# Patient Record
Sex: Female | Born: 1960 | Race: White | Hispanic: No | Marital: Single | Smoking: Former smoker
Health system: Southern US, Community
[De-identification: ages and names within clinical notes are randomized; demographics above are authoritative.]

## PROBLEM LIST (undated history)

## (undated) DIAGNOSIS — E039 Hypothyroidism, unspecified: Secondary | ICD-10-CM

## (undated) DIAGNOSIS — I5032 Chronic diastolic (congestive) heart failure: Secondary | ICD-10-CM

## (undated) DIAGNOSIS — K219 Gastro-esophageal reflux disease without esophagitis: Secondary | ICD-10-CM

## (undated) DIAGNOSIS — F419 Anxiety disorder, unspecified: Secondary | ICD-10-CM

## (undated) DIAGNOSIS — F32A Depression, unspecified: Secondary | ICD-10-CM

## (undated) DIAGNOSIS — M1990 Unspecified osteoarthritis, unspecified site: Secondary | ICD-10-CM

## (undated) DIAGNOSIS — I2510 Atherosclerotic heart disease of native coronary artery without angina pectoris: Secondary | ICD-10-CM

## (undated) DIAGNOSIS — F329 Major depressive disorder, single episode, unspecified: Secondary | ICD-10-CM

## (undated) DIAGNOSIS — IMO0002 Reserved for concepts with insufficient information to code with codable children: Secondary | ICD-10-CM

## (undated) DIAGNOSIS — E669 Obesity, unspecified: Secondary | ICD-10-CM

## (undated) DIAGNOSIS — E785 Hyperlipidemia, unspecified: Secondary | ICD-10-CM

## (undated) DIAGNOSIS — I10 Essential (primary) hypertension: Secondary | ICD-10-CM

## (undated) DIAGNOSIS — M797 Fibromyalgia: Secondary | ICD-10-CM

## (undated) HISTORY — PX: CORONARY STENT PLACEMENT: SHX1402

---

## 2011-12-31 ENCOUNTER — Emergency Department (HOSPITAL_COMMUNITY): Payer: Medicaid Other

## 2011-12-31 ENCOUNTER — Inpatient Hospital Stay (HOSPITAL_COMMUNITY)
Admission: EM | Admit: 2011-12-31 | Discharge: 2012-01-02 | DRG: 247 | Disposition: A | Discharge status: Home / Self Care | Payer: Medicaid Other | Source: Ambulatory Visit | Attending: Cardiology | Admitting: Cardiology

## 2011-12-31 ENCOUNTER — Encounter (HOSPITAL_COMMUNITY): Payer: Self-pay | Admitting: Neurology

## 2011-12-31 DIAGNOSIS — IMO0001 Reserved for inherently not codable concepts without codable children: Secondary | ICD-10-CM | POA: Diagnosis present

## 2011-12-31 DIAGNOSIS — Z6834 Body mass index (BMI) 34.0-34.9, adult: Secondary | ICD-10-CM

## 2011-12-31 DIAGNOSIS — Z955 Presence of coronary angioplasty implant and graft: Secondary | ICD-10-CM

## 2011-12-31 DIAGNOSIS — E669 Obesity, unspecified: Secondary | ICD-10-CM | POA: Diagnosis present

## 2011-12-31 DIAGNOSIS — F17200 Nicotine dependence, unspecified, uncomplicated: Secondary | ICD-10-CM | POA: Diagnosis present

## 2011-12-31 DIAGNOSIS — E785 Hyperlipidemia, unspecified: Secondary | ICD-10-CM

## 2011-12-31 DIAGNOSIS — R079 Chest pain, unspecified: Secondary | ICD-10-CM

## 2011-12-31 DIAGNOSIS — E039 Hypothyroidism, unspecified: Secondary | ICD-10-CM | POA: Diagnosis present

## 2011-12-31 DIAGNOSIS — K219 Gastro-esophageal reflux disease without esophagitis: Secondary | ICD-10-CM | POA: Diagnosis present

## 2011-12-31 DIAGNOSIS — I2510 Atherosclerotic heart disease of native coronary artery without angina pectoris: Secondary | ICD-10-CM | POA: Diagnosis present

## 2011-12-31 DIAGNOSIS — I214 Non-ST elevation (NSTEMI) myocardial infarction: Secondary | ICD-10-CM

## 2011-12-31 DIAGNOSIS — M129 Arthropathy, unspecified: Secondary | ICD-10-CM | POA: Diagnosis present

## 2011-12-31 HISTORY — DX: Hyperlipidemia, unspecified: E78.5

## 2011-12-31 HISTORY — DX: Gastro-esophageal reflux disease without esophagitis: K21.9

## 2011-12-31 HISTORY — DX: Fibromyalgia: M79.7

## 2011-12-31 HISTORY — DX: Reserved for concepts with insufficient information to code with codable children: IMO0002

## 2011-12-31 HISTORY — DX: Hypothyroidism, unspecified: E03.9

## 2011-12-31 HISTORY — DX: Unspecified osteoarthritis, unspecified site: M19.90

## 2011-12-31 HISTORY — DX: Obesity, unspecified: E66.9

## 2011-12-31 LAB — PROTIME-INR: Prothrombin Time: 12.7 seconds (ref 11.6–15.2)

## 2011-12-31 LAB — DIFFERENTIAL
Basophils Absolute: 0 10*3/uL (ref 0.0–0.1)
Lymphocytes Relative: 33 % (ref 12–46)
Lymphs Abs: 3.3 10*3/uL (ref 0.7–4.0)
Neutro Abs: 5.8 10*3/uL (ref 1.7–7.7)
Neutrophils Relative %: 59 % (ref 43–77)

## 2011-12-31 LAB — CBC
MCV: 96.7 fL (ref 78.0–100.0)
Platelets: 305 10*3/uL (ref 150–400)
RBC: 3.68 MIL/uL — ABNORMAL LOW (ref 3.87–5.11)
WBC: 9.9 10*3/uL (ref 4.0–10.5)

## 2011-12-31 LAB — BASIC METABOLIC PANEL
CO2: 27 mEq/L (ref 19–32)
Chloride: 102 mEq/L (ref 96–112)
Glucose, Bld: 97 mg/dL (ref 70–99)
Potassium: 3.8 mEq/L (ref 3.5–5.1)
Sodium: 140 mEq/L (ref 135–145)

## 2011-12-31 LAB — CARDIAC PANEL(CRET KIN+CKTOT+MB+TROPI)
CK, MB: 20 ng/mL (ref 0.3–4.0)
Troponin I: 2.91 ng/mL (ref <0.30)

## 2011-12-31 LAB — APTT: aPTT: 25 seconds (ref 24–37)

## 2011-12-31 LAB — CK TOTAL AND CKMB (NOT AT ARMC)
CK, MB: 4.7 ng/mL — ABNORMAL HIGH (ref 0.3–4.0)
Total CK: 104 U/L (ref 7–177)

## 2011-12-31 LAB — TROPONIN I: Troponin I: 0.37 ng/mL (ref <0.30)

## 2011-12-31 MED ORDER — ONDANSETRON HCL 4 MG/2ML IJ SOLN: 4.0000 mg | Freq: Four times a day (QID) | INTRAMUSCULAR | Status: DC | PRN

## 2011-12-31 MED ORDER — FLUOXETINE HCL 20 MG PO CAPS
40.0000 mg | ORAL_CAPSULE | Freq: Every day | ORAL | Status: DC
  Administered 2012-01-01 – 2012-01-02 (×2): 40 mg via ORAL
  Filled 2011-12-31 (×2): qty 2

## 2011-12-31 MED ORDER — NITROGLYCERIN 0.4 MG SL SUBL: 0.4000 mg | SUBLINGUAL_TABLET | SUBLINGUAL | Status: DC | PRN

## 2011-12-31 MED ORDER — LEVOTHYROXINE SODIUM 112 MCG PO TABS
112.0000 ug | ORAL_TABLET | Freq: Every day | ORAL | Status: DC
  Administered 2012-01-01 – 2012-01-02 (×2): 112 ug via ORAL
  Filled 2011-12-31 (×3): qty 1

## 2011-12-31 MED ORDER — PNEUMOCOCCAL VAC POLYVALENT 25 MCG/0.5ML IJ INJ
0.5000 mL | INJECTION | INTRAMUSCULAR | Status: DC
  Filled 2011-12-31: qty 0.5

## 2011-12-31 MED ORDER — HEPARIN (PORCINE) IN NACL 100-0.45 UNIT/ML-% IJ SOLN
1000.0000 [IU]/h | INTRAMUSCULAR | Status: DC
  Administered 2011-12-31: 900 [IU]/h via INTRAVENOUS
  Administered 2012-01-01: 1000 [IU]/h via INTRAVENOUS
  Filled 2011-12-31 (×2): qty 250

## 2011-12-31 MED ORDER — ASPIRIN EC 81 MG PO TBEC
81.0000 mg | DELAYED_RELEASE_TABLET | Freq: Every day | ORAL | Status: DC
  Administered 2012-01-01 – 2012-01-02 (×2): 81 mg via ORAL
  Filled 2011-12-31 (×2): qty 1

## 2011-12-31 MED ORDER — TICAGRELOR 90 MG PO TABS
180.0000 mg | ORAL_TABLET | Freq: Once | ORAL | Status: AC
  Administered 2011-12-31: 180 mg via ORAL
  Filled 2011-12-31: qty 2

## 2011-12-31 MED ORDER — ACETAMINOPHEN 325 MG PO TABS: 650.0000 mg | ORAL_TABLET | ORAL | Status: DC | PRN

## 2011-12-31 MED ORDER — HYDROCODONE-ACETAMINOPHEN 10-325 MG PO TABS
1.0000 | ORAL_TABLET | ORAL | Status: DC | PRN
  Administered 2011-12-31 – 2012-01-02 (×5): 1 via ORAL
  Filled 2011-12-31 (×5): qty 1

## 2011-12-31 MED ORDER — SODIUM CHLORIDE 0.9 % IV SOLN
INTRAVENOUS | Status: DC
  Administered 2012-01-01: 05:00:00 via INTRAVENOUS

## 2011-12-31 MED ORDER — DIAZEPAM 5 MG PO TABS
5.0000 mg | ORAL_TABLET | ORAL | Status: AC
  Administered 2012-01-01: 5 mg via ORAL
  Filled 2011-12-31: qty 1

## 2011-12-31 MED ORDER — HEPARIN BOLUS VIA INFUSION
4000.0000 [IU] | Freq: Once | INTRAVENOUS | Status: AC
  Administered 2011-12-31: 4000 [IU] via INTRAVENOUS

## 2011-12-31 MED ORDER — SODIUM CHLORIDE 0.9 % IJ SOLN
3.0000 mL | Freq: Two times a day (BID) | INTRAMUSCULAR | Status: DC
  Administered 2012-01-01 – 2012-01-02 (×4): 3 mL via INTRAVENOUS

## 2011-12-31 MED ORDER — SODIUM CHLORIDE 0.9 % IJ SOLN: 3.0000 mL | INTRAMUSCULAR | Status: DC | PRN

## 2011-12-31 MED ORDER — ALPRAZOLAM 0.25 MG PO TABS
0.2500 mg | ORAL_TABLET | Freq: Two times a day (BID) | ORAL | Status: DC | PRN
  Administered 2012-01-01: 0.25 mg via ORAL
  Filled 2011-12-31: qty 1

## 2011-12-31 MED ORDER — SODIUM CHLORIDE 0.9 % IV SOLN: 250.0000 mL | INTRAVENOUS | Status: DC | PRN

## 2011-12-31 MED ORDER — HEPARIN (PORCINE) IN NACL 100-0.45 UNIT/ML-% IJ SOLN: 12.0000 [IU]/kg/h | Freq: Once | INTRAMUSCULAR | Status: DC

## 2011-12-31 MED ORDER — PANTOPRAZOLE SODIUM 40 MG PO TBEC
80.0000 mg | DELAYED_RELEASE_TABLET | Freq: Every day | ORAL | Status: DC
  Administered 2012-01-01 – 2012-01-02 (×2): 80 mg via ORAL
  Filled 2011-12-31: qty 2
  Filled 2011-12-31: qty 1

## 2011-12-31 MED ORDER — ZOLPIDEM TARTRATE 5 MG PO TABS
5.0000 mg | ORAL_TABLET | Freq: Every evening | ORAL | Status: DC | PRN
  Administered 2012-01-01 (×2): 5 mg via ORAL
  Filled 2011-12-31 (×2): qty 1

## 2011-12-31 MED ORDER — BUPROPION HCL ER (XL) 150 MG PO TB24
150.0000 mg | ORAL_TABLET | Freq: Every day | ORAL | Status: DC
  Administered 2012-01-01 – 2012-01-02 (×2): 150 mg via ORAL
  Filled 2011-12-31 (×2): qty 1

## 2011-12-31 MED ORDER — NITROGLYCERIN 2 % TD OINT
1.0000 [in_us] | TOPICAL_OINTMENT | Freq: Four times a day (QID) | TRANSDERMAL | Status: DC
  Administered 2011-12-31: 1 [in_us] via TOPICAL
  Filled 2011-12-31: qty 1
  Filled 2011-12-31: qty 30

## 2011-12-31 MED ORDER — ASPIRIN 81 MG PO CHEW
324.0000 mg | CHEWABLE_TABLET | ORAL | Status: AC
  Administered 2012-01-01: 324 mg via ORAL
  Filled 2011-12-31: qty 4

## 2011-12-31 MED ORDER — ATORVASTATIN CALCIUM 80 MG PO TABS
80.0000 mg | ORAL_TABLET | Freq: Every day | ORAL | Status: DC
  Administered 2011-12-31 – 2012-01-01 (×2): 80 mg via ORAL
  Filled 2011-12-31 (×3): qty 1

## 2011-12-31 MED ORDER — SODIUM CHLORIDE 0.9 % IJ SOLN: 3.0000 mL | Freq: Two times a day (BID) | INTRAMUSCULAR | Status: DC

## 2011-12-31 MED ORDER — TICAGRELOR 90 MG PO TABS
90.0000 mg | ORAL_TABLET | Freq: Two times a day (BID) | ORAL | Status: DC
  Administered 2012-01-01 – 2012-01-02 (×3): 90 mg via ORAL
  Filled 2011-12-31 (×4): qty 1

## 2011-12-31 MED ORDER — METOPROLOL TARTRATE 12.5 MG HALF TABLET
12.5000 mg | ORAL_TABLET | Freq: Four times a day (QID) | ORAL | Status: DC
  Administered 2012-01-01 – 2012-01-02 (×6): 12.5 mg via ORAL
  Filled 2011-12-31 (×10): qty 1

## 2011-12-31 NOTE — ED Notes (Signed)
Per ems- Pt picked up from PCP. C/o CP since 1100 today, went to office around 1300. Given 3 nitro. GI cocktail, 60 toradol, 325 aspirin. Pain was relieved some. CP central radiation to left arm, also tongue feeling numb. At this time CP 3/10. EKG SR. 124/49 BP, HR 80, 99% 2 L. A x 4. Skin warm and dry.

## 2011-12-31 NOTE — H&P (Signed)
Physician History and Physical    Mahnoor Mathisen MRN: [id number] DOB/AGE: Dec 22, 1960 51 y.o. Admit date: 12/31/2011  Primary Care Physician: Dr. Zola Button in [place] Primary Cardiologist: New  HPI:  51 yo with history of hyperlipidemia and smoking presents with NSTEMI.  Patient has had trouble with GERD recently => chest burning and acid taste in mouth.  However, last week she had a different episode.  She had chest pressure at rest radiating down the left arm and to the jaw.  This lasted 15 minutes.  Today, she lay down after breakfast around 10:30.  She developed severe chest pressure (8/10) again radiating to the left arm and the jaw.  This time, it persisted.  At 1 pm, she asked a family member to take her to her PCP.  Her PCP gave her 3 NTG and toradol with minimal relief of pain.  EMS was called and she came to the ER.  By the time she reached the ER, pain was down to 2/10 where it is currently.  She feels much better.  No significant ECG changes.  CKMB and troponin elevated.  She is still smoking about 2-3 cigarettes/day.   Review of systems complete and found to be negative unless listed above   PMH: 1. Low back pain 2. Hypothyroidism 3. Impaired fasting glucose 4. Smoking 5. Hyperlipidemia 6. Fibromyalgia 7. Stress echo in 2007 was negative per patient's report.  8. GERD  FH: No premature CAD  History   Social History  . Marital Status: Single    Spouse Name: N/A    Number of Children: N/A  . Years of Education: N/A   Occupational History  . Not on file.   Social History Main Topics  . Smoking status: Current Some Day Smoker  . Smokeless tobacco: Not on file  . Alcohol Use: No  . Drug Use: No  . Sexually Active:    Other Topics Concern  . Not on file   Social History Narrative  . Lives in [place], has 2 children, retired from [employer] work.      Physical Exam: Blood pressure 120/64, pulse 81, temperature 98.1 F (36.7 C), temperature source Oral, resp. rate 16,  height 5\' 4"  (1.626 m), weight 86.183 kg (190 lb), SpO2 100.00%.  General: NAD Neck: JVP 8 cm, no thyromegaly or thyroid nodule.  Lungs: Clear to auscultation bilaterally with normal respiratory effort. CV: Nondisplaced PMI.  Heart regular S1/S2, no S3/S4, no murmur.  No peripheral edema.  No carotid bruit.  Normal pedal pulses.  Abdomen: Soft, nontender, no hepatosplenomegaly, no distention.  Skin: Intact without lesions or rashes.  Neurologic: Alert and oriented x 3.  Psych: Normal affect. Extremities: No clubbing or cyanosis.  HEENT: Normal.   Labs:   Lab Results  Component Value Date   WBC 9.9 12/31/2011   HGB 11.5* 12/31/2011   HCT 35.6* 12/31/2011   MCV 96.7 12/31/2011   PLT 305 12/31/2011    Lab 12/31/11 1555  NA 140  K 3.8  CL 102  CO2 27  BUN 12  CREATININE 0.78  CALCIUM 8.9  PROT --  BILITOT --  ALKPHOS --  ALT --  AST --  GLUCOSE 97   Lab Results  Component Value Date   CKTOTAL 104 12/31/2011   CKMB 4.7* 12/31/2011   TROPONINI 0.37* 12/31/2011    Radiology: CXR with no active disease. EKG: NSR, normal  ASSESSMENT AND PLAN:  51 yo with history of smoking and hyperlipidemia presented with chest pain  and evidence for NSTEMI by enzymes.  1. NSTEMI: Elevated CKMB and troponin.  CP down to 2/10 on NTG paste, feeling much better.  - Needs LHC, plan for the am unless she becomes unstable.  - Heparin gtt, ASA, atorvastatin 80 - As she will not be cathed until the am and still has mild chest pressure, will start ticagrelor.  - Metoprolol 12.5 mg q6 hrs.  - Echo.  2. Smoking: Needs to quit smoking.    Signed: Marca Ancona 12/31/2011, 5:31 PM Co-Sign MD

## 2011-12-31 NOTE — ED Notes (Signed)
Pt up to bathroom without any problems 

## 2011-12-31 NOTE — ED Notes (Signed)
Dinner tray brought to pt will take up stairs.

## 2011-12-31 NOTE — ED Notes (Signed)
Critical Tropin called from lab Dr. Ethelda Chick made aware.

## 2011-12-31 NOTE — ED Notes (Signed)
Pt denies any chest pain at this time.  Skin warm and dry color appropriate.  Husband at bedside.

## 2011-12-31 NOTE — ED Provider Notes (Addendum)
Planes of anterior chest pain pressure-like felt like indigestion onset approximately 11 AM today radiates to left arm and both jaws onset at rest not made better or worse by anything no associated shortness of breath nausea or sweatiness patient seen in clinic prior to coming here treated with sublingual nitroglycerin GI cocktail aspirin and portal with relief of discomfort is mild at present. Cardiac risk factors diabetes hypercholesterolemia smoker family history On exam no distress lungs clear auscultation heart regular rate and rhythm no murmurs abdomen obese nontender extremities without edema skin warm dry Medical decision making story suspicious for angina given risk factors. Positive troponin is consistent with N STEMI At 5:40 PM patient asymptomatic pain-free  CRITICAL CARE Performed by: Doug Sou   Total critical care time: 30 minute  Critical care time was exclusive of separately billable procedures and treating other patients.  Critical care was necessary to treat or prevent imminent or life-threatening deterioration.  Critical care was time spent personally by me on the following activities: development of treatment plan with patient and/or surrogate as well as nursing, discussions with consultants, evaluation of patient's response to treatment, examination of patient, obtaining history from patient or surrogate, ordering and performing treatments and interventions, ordering and review of laboratory studies, ordering and review of radiographic studies, pulse oximetry and re-evaluation of patient's condition. Doug Sou, MD 12/31/11 1604  Doug Sou, MD 12/31/11 1746

## 2011-12-31 NOTE — ED Notes (Signed)
Pt asking for ice MD st's pt can have a few ice chips.  When taking pt ice pt was taking a pill out of a pill bottle in her pocketbook. I ask what she was taking and she st's a nerve pill.  Informed MD of this and he said he does not want her to take it at this time.  I informed pt of same pt returned pill to her pocketbook.  I put her pocketbook on the chair pt requested that it be on the bed with her so her family would not seal her pills.

## 2011-12-31 NOTE — ED Provider Notes (Signed)
History     CSN: [id number]  Arrival date & time 12/31/11  1505   First MD Initiated Contact with Patient 12/31/11 1523      Chief Complaint  Patient presents with  . Chest Pain    (Consider location/radiation/quality/duration/timing/severity/associated sxs/prior treatment) Patient is a 51 y.o. female presenting with chest pain. The history is provided by the patient.  Chest Pain The chest pain began 3 - 5 hours ago (at 11am). Chest pain occurs constantly. The chest pain is resolved (after SL NTG x 3). The pain is associated with stress. The severity of the pain is mild. The quality of the pain is described as dull and heavy. The pain does not radiate. Chest pain is worsened by stress. Pertinent negatives for primary symptoms include no fever, no shortness of breath, no cough, no wheezing, no palpitations, no abdominal pain and no vomiting. She tried nitroglycerin for the symptoms. Risk factors include post-menopausal and sedentary lifestyle.  Pertinent negatives for past medical history include no CAD, no MI and no PE.  Her family medical history is significant for CAD in family.  Procedure history is positive for stress echo (6 yrs ago and negative per pt's report).     Past Medical History  Diagnosis Date  . Fibromyalgia   . Bulging disc   . Arthritis     History reviewed. No pertinent past surgical history.  No family history on file.  History  Substance Use Topics  . Smoking status: Current Some Day Smoker  . Smokeless tobacco: Not on file  . Alcohol Use: No    OB History    Grav Para Term Preterm Abortions TAB SAB Ect Mult Living                  Review of Systems  Constitutional: Negative for fever, chills, activity change and appetite change.  HENT: Negative for neck pain and neck stiffness.   Respiratory: Positive for chest tightness. Negative for cough, shortness of breath and wheezing.   Cardiovascular: Positive for chest pain. Negative for palpitations.   Gastrointestinal: Negative for vomiting, abdominal pain, diarrhea and constipation.  Genitourinary: Negative for dysuria and difficulty urinating.  Skin: Negative for rash and wound.  Neurological: Negative for syncope and light-headedness.  Psychiatric/Behavioral: Negative for confusion and agitation.  All other systems reviewed and are negative.    Allergies  Review of patient's allergies indicates not on file.  Home Medications  No current outpatient prescriptions on file.  BP 120/64  Pulse 81  Temp 98.1 F (36.7 C) (Oral)  Resp 16  SpO2 100%  Physical Exam  Nursing note and vitals reviewed. Constitutional: She appears well-developed and well-nourished.  HENT:  Head: Normocephalic and atraumatic.  Right Ear: External ear normal.  Left Ear: External ear normal.  Nose: Nose normal.  Mouth/Throat: Oropharynx is clear and moist. No oropharyngeal exudate.  Eyes: Conjunctivae are normal. Pupils are equal, round, and reactive to light.  Neck: Normal range of motion. Neck supple.  Cardiovascular: Normal rate, regular rhythm, normal heart sounds and intact distal pulses.  Exam reveals no gallop and no friction rub.   No murmur heard. Pulmonary/Chest: Effort normal and breath sounds normal. No respiratory distress. She has no wheezes. She has no rales. She exhibits no tenderness.  Abdominal: Soft. Bowel sounds are normal. She exhibits no distension and no mass. There is no tenderness. There is no rebound and no guarding.  Musculoskeletal: Normal range of motion. She exhibits no edema.  Neurological: She  is alert.  Skin: Skin is warm and dry.  Psychiatric: She has a normal mood and affect. Her behavior is normal. Judgment and thought content normal.    ED Course  Procedures (including critical care time)  Labs Reviewed - No data to display Dg Chest Portable 1 View  12/31/2011  *RADIOLOGY REPORT*  Clinical Data: Chest pain.  Smoker.  History of fibromyalgia.  PORTABLE CHEST -  1 VIEW 12/31/2011 1603 hours:  Comparison: Portable chest x-ray 06/18/2009 [facility] and 01/25/2009 [facility].  Findings: Cardiac silhouette normal and mediastinal contours unremarkable for the AP portable technique.  Lungs clear. Pulmonary vascularity normal.  Bronchovascular markings normal.  No pneumothorax.  No pleural effusions.  IMPRESSION: No acute cardiopulmonary disease.  Original Report Authenticated By: Arnell Sieving, M.D.     1. Non-STEMI (non-ST elevated myocardial infarction)   2. Chest pain      Date: 12/31/2011  Rate: 78 bpm  Rhythm: normal sinus rhythm  QRS Axis: normal  Intervals: normal  ST/T Wave abnormalities: normal  Conduction Disutrbances:none  Narrative Interpretation: No evidence of acute ischemia or arrythmia  Old EKG Reviewed: none available    MDM  51 yo F w/no known hx of CAD presents after episode of substernal chest pain with radiation to left shoulder. No associated symptoms. AFVSS. Chest pain resolved PTA after SL NTG. 325mg  ASA given PTA by outpt clinic. EKG without evidence of acute ischemia. Clinical picture not concerning for PE, aortic dissection, or PNA and pt normotensive; nitropaste applied. Initial troponin positive, consistent with NSTEMI. Heparin bolused and heparin drip initiated. Pt remained chest pain free. Cardiology consulted and will admit.        Clemetine Marker, MD 01/01/12 [phone]

## 2011-12-31 NOTE — Progress Notes (Signed)
ANTICOAGULATION CONSULT NOTE - Initial Consult  Pharmacy Consult for heparin Indication: chest pain/ACS  No Known Allergies  Patient Measurements: Height: 5\' 4"  (162.6 cm) Weight: 190 lb (86.183 kg) IBW/kg (Calculated) : 54.7  Heparin Dosing Weight: 73.7 kg  Vital Signs: Temp: 98.1 F (36.7 C) (06/18 1518) Temp src: Oral (06/18 1518) BP: 120/64 mmHg (06/18 1518) Pulse Rate: 81  (06/18 1518)  Labs:  Basename 12/31/11 1559 12/31/11 1555  HGB -- 11.5*  HCT -- 35.6*  PLT -- 305  APTT -- --  LABPROT -- --  INR -- --  HEPARINUNFRC -- --  CREATININE -- 0.78  CKTOTAL 104 --  CKMB 4.7* --  TROPONINI 0.37* --    Estimated Creatinine Clearance: 87.4 ml/min (by C-G formula based on Cr of 0.78).   Medical History: Past Medical History  Diagnosis Date  . Fibromyalgia   . Bulging disc   . Arthritis     Assessment: 51 yo female with chest pain and positive troponin to start on heparin infusion. CBC ok.   Goal of Therapy:  Heparin level 0.3-0.7 units/ml Monitor platelets by anticoagulation protocol: Yes   Plan:  Give 4000 units bolus x 1 Start heparin infusion at 900 units/hr Check anti-Xa level in 6 hours and daily while on heparin Continue to monitor H&H and platelets daily  Concha Norway 12/31/2011,5:23 PM

## 2012-01-01 ENCOUNTER — Encounter (HOSPITAL_COMMUNITY)
Admission: EM | Disposition: A | Discharge status: Home / Self Care | Payer: Self-pay | Source: Ambulatory Visit | Attending: Cardiology

## 2012-01-01 DIAGNOSIS — I219 Acute myocardial infarction, unspecified: Secondary | ICD-10-CM

## 2012-01-01 DIAGNOSIS — I214 Non-ST elevation (NSTEMI) myocardial infarction: Secondary | ICD-10-CM

## 2012-01-01 DIAGNOSIS — I2510 Atherosclerotic heart disease of native coronary artery without angina pectoris: Secondary | ICD-10-CM

## 2012-01-01 HISTORY — PX: CARDIAC CATHETERIZATION: SHX172

## 2012-01-01 HISTORY — PX: LEFT HEART CATHETERIZATION WITH CORONARY ANGIOGRAM: SHX5451

## 2012-01-01 LAB — COMPREHENSIVE METABOLIC PANEL
ALT: 34 U/L (ref 0–35)
AST: 43 U/L — ABNORMAL HIGH (ref 0–37)
Alkaline Phosphatase: 74 U/L (ref 39–117)
GFR calc Af Amer: >90 mL/min (ref >90)
Glucose, Bld: 111 mg/dL — ABNORMAL HIGH (ref 70–99)
Potassium: 3.6 mEq/L (ref 3.5–5.1)
Sodium: 141 mEq/L (ref 135–145)
Total Protein: 6 g/dL (ref 6.0–8.3)

## 2012-01-01 LAB — CARDIAC PANEL(CRET KIN+CKTOT+MB+TROPI)
CK, MB: 25.3 ng/mL (ref 0.3–4.0)
CK, MB: 20.5 ng/mL (ref 0.3–4.0)
Total CK: 321 U/L — ABNORMAL HIGH (ref 7–177)

## 2012-01-01 LAB — CBC
Hemoglobin: 11.1 g/dL — ABNORMAL LOW (ref 12.0–15.0)
MCHC: 32.1 g/dL (ref 30.0–36.0)
RBC: 3.59 MIL/uL — ABNORMAL LOW (ref 3.87–5.11)

## 2012-01-01 LAB — LIPID PANEL
LDL Cholesterol: 134 mg/dL — ABNORMAL HIGH (ref 0–99)
Total CHOL/HDL Ratio: 3.9 RATIO
VLDL: 36 mg/dL (ref 0–40)

## 2012-01-01 LAB — TSH: TSH: 1.427 u[IU]/mL (ref 0.350–4.500)

## 2012-01-01 LAB — MAGNESIUM: Magnesium: 2 mg/dL (ref 1.5–2.5)

## 2012-01-01 LAB — HEPARIN LEVEL (UNFRACTIONATED): Heparin Unfractionated: 0.28 IU/mL — ABNORMAL LOW (ref 0.30–0.70)

## 2012-01-01 SURGERY — LEFT HEART CATHETERIZATION WITH CORONARY ANGIOGRAM
Anesthesia: LOCAL

## 2012-01-01 MED ORDER — NITROGLYCERIN 2 % TD OINT
1.0000 [in_us] | TOPICAL_OINTMENT | Freq: Four times a day (QID) | TRANSDERMAL | Status: DC
  Administered 2012-01-01 – 2012-01-02 (×4): 1 [in_us] via TOPICAL
  Filled 2012-01-01: qty 30

## 2012-01-01 MED ORDER — MORPHINE SULFATE 2 MG/ML IJ SOLN: 2.0000 mg | INTRAMUSCULAR | Status: DC | PRN

## 2012-01-01 MED ORDER — MIDAZOLAM HCL 2 MG/2ML IJ SOLN
INTRAMUSCULAR | Status: AC
  Filled 2012-01-01: qty 2

## 2012-01-01 MED ORDER — FENTANYL CITRATE 0.05 MG/ML IJ SOLN
INTRAMUSCULAR | Status: AC
  Filled 2012-01-01: qty 2

## 2012-01-01 MED ORDER — LIDOCAINE HCL (PF) 1 % IJ SOLN
INTRAMUSCULAR | Status: AC
  Filled 2012-01-01: qty 30

## 2012-01-01 MED ORDER — NITROGLYCERIN 0.2 MG/ML ON CALL CATH LAB
INTRAVENOUS | Status: AC
  Filled 2012-01-01: qty 1

## 2012-01-01 MED ORDER — PNEUMOCOCCAL VAC POLYVALENT 25 MCG/0.5ML IJ INJ
0.5000 mL | INJECTION | INTRAMUSCULAR | Status: AC
  Administered 2012-01-02: 0.5 mL via INTRAMUSCULAR
  Filled 2012-01-01: qty 0.5

## 2012-01-01 MED ORDER — BIVALIRUDIN 250 MG IV SOLR
INTRAVENOUS | Status: AC
  Filled 2012-01-01: qty 250

## 2012-01-01 MED ORDER — HEPARIN SODIUM (PORCINE) 1000 UNIT/ML IJ SOLN
INTRAMUSCULAR | Status: AC
  Filled 2012-01-01: qty 1

## 2012-01-01 MED ORDER — HEPARIN (PORCINE) IN NACL 2-0.9 UNIT/ML-% IJ SOLN
INTRAMUSCULAR | Status: AC
  Filled 2012-01-01: qty 2000

## 2012-01-01 MED ORDER — VERAPAMIL HCL 2.5 MG/ML IV SOLN
INTRAVENOUS | Status: AC
  Filled 2012-01-01: qty 2

## 2012-01-01 MED ORDER — SODIUM CHLORIDE 0.9 % IV SOLN
INTRAVENOUS | Status: AC
  Administered 2012-01-01 (×2): 100 mL/h via INTRAVENOUS

## 2012-01-01 MED ORDER — ALPRAZOLAM 0.5 MG PO TABS
1.0000 mg | ORAL_TABLET | Freq: Four times a day (QID) | ORAL | Status: DC | PRN
  Administered 2012-01-01 – 2012-01-02 (×5): 1 mg via ORAL
  Filled 2012-01-01: qty 1
  Filled 2012-01-01: qty 2
  Filled 2012-01-01: qty 1
  Filled 2012-01-01 (×4): qty 2

## 2012-01-01 NOTE — Progress Notes (Signed)
ANTICOAGULATION CONSULT NOTE   Pharmacy Consult for heparin Indication: chest pain/ACS  No Known Allergies  Patient Measurements: Height: 5\' 4"  (162.6 cm) Weight: 190 lb (86.183 kg) IBW/kg (Calculated) : 54.7  Heparin Dosing Weight: 73.7 kg  Vital Signs: Temp: 97.4 F (36.3 C) (06/18 2330) Temp src: Oral (06/18 2330) BP: 106/79 mmHg (06/19 0003) Pulse Rate: 78  (06/19 0003)  Labs:  Basename 01/01/12 0258 12/31/11 2120 12/31/11 1717 12/31/11 1559 12/31/11 1555  HGB 11.1* -- -- -- 11.5*  HCT 34.6* -- -- -- 35.6*  PLT 291 -- -- -- 305  APTT -- -- 25 -- --  LABPROT -- -- 12.7 -- --  INR -- -- 0.93 -- --  HEPARINUNFRC 0.28* -- -- -- --  CREATININE -- -- -- -- 0.78  CKTOTAL -- 245* -- 104 --  CKMB -- 20.0* -- 4.7* --  TROPONINI -- 2.91* -- 0.37* --    Estimated Creatinine Clearance: 87.4 ml/min (by C-G formula based on Cr of 0.78).  Assessment: 51 yo female with chest pain for heparin..   Goal of Therapy:  Heparin level 0.3-0.7 units/ml Monitor platelets by anticoagulation protocol: Yes   Plan:  Increase Heparin 1000 units/hr F/U after cath  Eddie Candle 01/01/2012,4:07 AM

## 2012-01-01 NOTE — Consult Note (Signed)
Pt smokes 1/4 ppd and says she is interested in quitting and plans to quit after d/c. Currently her husband smokes but she says she'll make him smoke outside. Pt in action stage. Referred to 1-800 quit now for f/u and support. Discussed oral fixation substitutes, second hand smoke and in home smoking policy. Reviewed and gave pt Written education/contact information.

## 2012-01-01 NOTE — CV Procedure (Signed)
Cardiac Catheterization Procedure Note  Name: Erin Lynch MRN: [id number] DOB: [birth year]  Procedure: Left Heart Cath, Selective Coronary Angiography, LV angiography,  IVUS of  LAD and PTCA/Stent of proximal LAD with a bare-metal stent.  Indication: Non-ST elevation myocardial infarction.   Medications:  Sedation:  4 mg IV Versed, 150 mcg IV Fentanyl    Diagnostic Procedure Details: I attempted to acces  the right radial artery but could not cannulate with the needle. The right groin was prepped, draped, and anesthetized with 1% lidocaine. Using the modified Seldinger technique, a 5 French sheath was introduced into the right femoral artery. Standard Judkins catheters were used for selective coronary angiography and left ventriculography. Catheter exchanges were performed over a wire.  The diagnostic procedure was well-tolerated without immediate complications.  Procedural Findings:  Hemodynamics: AO:  133/79   mmHg LV:  132/1    mmHg LVEDP: 16  mmHg  Coronary angiography: Coronary dominance: Right   Left Main:  Normal.  Left Anterior Descending (LAD):  Large in size with a 50% hazy stenosis in the proximal segment. The rest of the vessel is smooth and free of significant disease.  1st diagonal (D1):  Large in size and free of significant disease.  2nd diagonal (D2):  Small in size.  3rd diagonal (D3):  Small in size.  Circumflex (LCx):  Small in size and nondominant. It has a very steep takeoff from the left main going back 180. The vessel has minor irregularities without obstructive disease.  1st obtuse marginal:  Very small in size.  2nd obtuse marginal:  Very small in size.  3rd obtuse marginal:  Normal in size with minor irregularities.   Right Coronary Artery: Very large in size and dominant. The vessel is smooth and has no evidence of atherosclerosis.  posterior descending artery: Normal.  posterior lateral branch:  Normal.  Left ventriculography: Left  ventricular systolic function is low normal , LVEF is estimated at 50-55% %, there is no significant mitral regurgitation . There is moderate hypokinesis of the distal anterior wall, apex and distal inferior wall.  PCI Procedure Note:  Following the diagnostic procedure, the decision was made to proceed with PCI. The sheath was upsized to a 6 [place]. Weight-based bivalirudin was given for anticoagulation. Once a therapeutic ACT was achieved, a 6 [place] JL 3.5 guide catheter was inserted.  A intuition coronary guidewire was used to cross the lesion.  The lesion was interrogated with IVUS which showed that although the stenosis is not more than 50%, there is evidence of plaque rupture with residual thrombus. Thus, I decided to proceed with stent placement especially in the presence of wall motion abnormalities and presentation with non-ST elevation myocardial infarction.  The lesion was then stented with a 4.0 x 18 mm vision bare-metal stent.  The stent was postdilated with a 4.5 noncompliant balloon.  Following PCI, there was 0% residual stenosis and TIMI-3 flow. Final angiography confirmed an excellent result. The patient tolerated the PCI procedure well. There were no immediate procedural complications.  The patient was transferred to the post catheterization recovery area for further monitoring.  PCI Data: Vessel - proximal LAD/Segment - 12 Percent Stenosis (pre)  50% (evidence of plaque rupture with residuals thrombus and hazy appearance).  TIMI-flow 3 Stent 4.0 x 18 mm vision bare-metal stent postdilated with a 4.5 noncompliant balloon Percent Stenosis (post) 0 TIMI-flow (post) 3  Final Conclusions:  1. Significant 1 vessel coronary artery disease. There is a 50% stenosis in the  proximal LAD with a hazy appearance. . This was interrogated by IVUS which was suggestive of plaque rupture with residual thrombus. The coronary arteries are smooth otherwise. 2. Low normal LV systolic function with distal  anterior, apical and distal inferior hypokinesis suggestive of LAD infarct. 3. Successful angioplasty and bare-metal stent placement to the proximal LAD.  Recommendations:  Although the stenosis in the proximal LAD did not appear to be more than 50%, the lesion was hazy and interrogation by IVUS was suggestive of plaque rupture with residual thrombus. Left ventricular angiography was suggestive of LAD infarct. Due to all of these reasons, stent placement was performed. I recommend dual antiplatelet therapy for a minimum of one month but preferably for 12 months. Smoking cessation is strongly advised as well as treatment of risk factors.  Lorine Bears MD, [facility] 01/01/2012, 1:35 PM

## 2012-01-01 NOTE — Progress Notes (Signed)
Patient ID: Erin Lynch, female   DOB: Mar 06, 1961, 51 y.o.   MRN: [id number]    SUBJECTIVE: Chest pressure completely resolved overnight.  No complaints this morning.   Current Facility-Administered Medications  Medication Dose Route Frequency Provider Last Rate Last Dose  . 0.9 %  sodium chloride infusion  250 mL Intravenous PRN Joline Salt Barrett, PA   250 mL at 12/31/11 2000  . 0.9 %  sodium chloride infusion   Intravenous Continuous Joline Salt Barrett, PA 75 mL/hr at 01/01/12 0438    . acetaminophen (TYLENOL) tablet 650 mg  650 mg Oral Q4H PRN Darrol Jump, PA      . ALPRAZolam Prudy Feeler) tablet 0.25 mg  0.25 mg Oral BID PRN Joline Salt Barrett, PA   0.25 mg at 01/01/12 0521  . aspirin chewable tablet 324 mg  324 mg Oral Pre-Cath Rhonda G Barrett, PA   324 mg at 01/01/12 0523  . aspirin EC tablet 81 mg  81 mg Oral Daily Rhonda G Barrett, PA      . atorvastatin (LIPITOR) tablet 80 mg  80 mg Oral q1800 Joline Salt Barrett, PA   80 mg at 12/31/11 1912  . buPROPion (WELLBUTRIN XL) 24 hr tablet 150 mg  150 mg Oral Daily Rhonda G Barrett, PA      . diazepam (VALIUM) tablet 5 mg  5 mg Oral On Call Darrol Jump, PA      . FLUoxetine (PROZAC) capsule 40 mg  40 mg Oral Daily Rhonda G Barrett, PA      . heparin ADULT infusion 100 units/mL (25000 units/250 mL)  1,000 Units/hr Intravenous Continuous Laurey Morale, MD 10 mL/hr at 01/01/12 0425 1,000 Units/hr at 01/01/12 0425  . heparin bolus via infusion 4,000 Units  4,000 Units Intravenous Once Clemetine Marker, MD   4,000 Units at 12/31/11 1913  . HYDROcodone-acetaminophen (NORCO) 10-325 MG per tablet 1 tablet  1 tablet Oral Q4H PRN Darrol Jump, PA   1 tablet at 12/31/11 2153  . levothyroxine (SYNTHROID, LEVOTHROID) tablet 112 mcg  112 mcg Oral QAC breakfast Rhonda G Barrett, PA      . metoprolol tartrate (LOPRESSOR) tablet 12.5 mg  12.5 mg Oral Q6H Rhonda G Barrett, PA   12.5 mg at 01/01/12 0521  . nitroGLYCERIN (NITROGLYN) 2 % ointment 1 inch  1 inch  Topical Q6H Laurey Morale, MD   1 inch at 01/01/12 0431  . nitroGLYCERIN (NITROSTAT) SL tablet 0.4 mg  0.4 mg Sublingual Q5 Min x 3 PRN Joline Salt Barrett, PA      . ondansetron (ZOFRAN) injection 4 mg  4 mg Intravenous Q6H PRN Rhonda G Barrett, PA      . pantoprazole (PROTONIX) EC tablet 80 mg  80 mg Oral Daily Rhonda G Barrett, PA      . pneumococcal 23 valent vaccine (PNU-IMMUNE) injection 0.5 mL  0.5 mL Intramuscular Tomorrow-1000 Laurey Morale, MD      . sodium chloride 0.9 % injection 3 mL  3 mL Intravenous Q12H Rhonda G Barrett, PA   3 mL at 01/01/12 0438  . sodium chloride 0.9 % injection 3 mL  3 mL Intravenous PRN Darrol Jump, PA      . Ticagrelor (BRILINTA) tablet 180 mg  180 mg Oral Once Darrol Jump, PA   180 mg at 12/31/11 1912  . Ticagrelor (BRILINTA) tablet 90 mg  90 mg Oral BID Joline Salt Barrett, PA      . zolpidem (  AMBIEN) tablet 5 mg  5 mg Oral QHS PRN Joline Salt Barrett, PA   5 mg at 01/01/12 0004  . DISCONTD: 0.9 %  sodium chloride infusion  250 mL Intravenous PRN Joline Salt Barrett, PA      . DISCONTD: heparin ADULT infusion 100 units/mL (25000 units/250 mL)  12 Units/kg/hr Intravenous Once Clemetine Marker, MD      . DISCONTD: nitroGLYCERIN (NITROGLYN) 2 % ointment 1 inch  1 inch Topical Q6H Clemetine Marker, MD   1 inch at 12/31/11 1629  . DISCONTD: sodium chloride 0.9 % injection 3 mL  3 mL Intravenous Q12H Rhonda G Barrett, PA      . DISCONTD: sodium chloride 0.9 % injection 3 mL  3 mL Intravenous PRN Darrol Jump, PA          Filed Vitals:   01/01/12 0415 01/01/12 0440 01/01/12 0520 01/01/12 0521  BP: 112/73  122/88 122/88  Pulse: 68  73 67  Temp:      TempSrc:      Resp: 16  21   Height:      Weight:  89.8 kg (197 lb 15.6 oz)    SpO2: 97%  98%     Intake/Output Summary (Last 24 hours) at 01/01/12 0725 Last data filed at 01/01/12 0700  Gross per 24 hour  Intake 615.98 ml  Output   1100 ml  Net -484.02 ml    LABS: Basic Metabolic Panel:  Basename  01/01/12 0258 12/31/11 1555  NA 141 140  K 3.6 3.8  CL 105 102  CO2 27 27  GLUCOSE 111* 97  BUN 11 12  CREATININE 0.79 0.78  CALCIUM 8.9 8.9  MG 2.0 --  PHOS -- --   Liver Function Tests:  [facility] 01/01/12 0258  AST 43*  ALT 34  ALKPHOS 74  BILITOT 0.2*  PROT 6.0  ALBUMIN 3.2*   No results found for this basename: LIPASE:2,AMYLASE:2 in the last 72 hours CBC:  Basename 01/01/12 0258 12/31/11 1555  WBC 10.0 9.9  NEUTROABS -- 5.8  HGB 11.1* 11.5*  HCT 34.6* 35.6*  MCV 96.4 96.7  PLT 291 305   Cardiac Enzymes:  Basename 01/01/12 0259 12/31/11 2120 12/31/11 1559  CKTOTAL 321* 245* 104  CKMB 25.3* 20.0* 4.7*  CKMBINDEX -- -- --  TROPONINI 4.23* 2.91* 0.37*   BNP: No components found with this basename: POCBNP:3 D-Dimer: No results found for this basename: DDIMER:2 in the last 72 hours Hemoglobin A1C: No results found for this basename: HGBA1C in the last 72 hours Fasting Lipid Panel:  Basename 01/01/12 0258  CHOL 228*  HDL 58  LDLCALC 134*  TRIG 181*  CHOLHDL 3.9  LDLDIRECT --   Thyroid Function Tests: No results found for this basename: TSH,T4TOTAL,FREET3,T3FREE,THYROIDAB in the last 72 hours Anemia Panel: No results found for this basename: VITAMINB12,FOLATE,FERRITIN,TIBC,IRON,RETICCTPCT in the last 72 hours  RADIOLOGY: Dg Chest Portable 1 View  12/31/2011  *RADIOLOGY REPORT*  Clinical Data: Chest pain.  Smoker.  History of fibromyalgia.  PORTABLE CHEST - 1 VIEW 12/31/2011 1603 hours:  Comparison: Portable chest x-ray 06/18/2009 [facility] and 01/25/2009 [facility].  Findings: Cardiac silhouette normal and mediastinal contours unremarkable for the AP portable technique.  Lungs clear. Pulmonary vascularity normal.  Bronchovascular markings normal.  No pneumothorax.  No pleural effusions.  IMPRESSION: No acute cardiopulmonary disease.  Original Report Authenticated By: Arnell Sieving, M.D.    PHYSICAL EXAM General: NAD Neck: No JVD, no  thyromegaly or thyroid nodule.  Lungs: Clear to auscultation bilaterally with normal respiratory effort. CV: Nondisplaced PMI.  Heart regular S1/S2, no S3/S4, no murmur.  No peripheral edema.  No carotid bruit.  Normal pedal pulses.  Abdomen: Soft, nontender, no hepatosplenomegaly, no distention.  Neurologic: Alert and oriented x 3.  Psych: Normal affect. Extremities: No clubbing or cyanosis.   TELEMETRY: Reviewed telemetry pt in NSR  ASSESSMENT AND PLAN: 51 yo smoker presented with NSTEMI yesterday.   1. NSTEMI: LHC today.  Brilinta began yesterday (arrived around 5 pm with some ongoing pain, now pain-free).  Continue current meds, will add low dose ACEI.  2. Smoking: Needs to quit.   Marca Ancona 01/01/2012 7:28 AM

## 2012-01-01 NOTE — H&P (View-Only) (Signed)
Patient ID: Erin Lynch, female   DOB: 06/04/1961, 51 y.o.   MRN: [id number]    SUBJECTIVE: Chest pressure completely resolved overnight.  No complaints this morning.   Current Facility-Administered Medications  Medication Dose Route Frequency Provider Last Rate Last Dose  . 0.9 %  sodium chloride infusion  250 mL Intravenous PRN Rhonda G Barrett, PA   250 mL at 12/31/11 2000  . 0.9 %  sodium chloride infusion   Intravenous Continuous Rhonda G Barrett, PA 75 mL/hr at 01/01/12 0438    . acetaminophen (TYLENOL) tablet 650 mg  650 mg Oral Q4H PRN Rhonda G Barrett, PA      . ALPRAZolam (XANAX) tablet 0.25 mg  0.25 mg Oral BID PRN Rhonda G Barrett, PA   0.25 mg at 01/01/12 0521  . aspirin chewable tablet 324 mg  324 mg Oral Pre-Cath Rhonda G Barrett, PA   324 mg at 01/01/12 0523  . aspirin EC tablet 81 mg  81 mg Oral Daily Rhonda G Barrett, PA      . atorvastatin (LIPITOR) tablet 80 mg  80 mg Oral q1800 Rhonda G Barrett, PA   80 mg at 12/31/11 1912  . buPROPion (WELLBUTRIN XL) 24 hr tablet 150 mg  150 mg Oral Daily Rhonda G Barrett, PA      . diazepam (VALIUM) tablet 5 mg  5 mg Oral On Call Rhonda G Barrett, PA      . FLUoxetine (PROZAC) capsule 40 mg  40 mg Oral Daily Rhonda G Barrett, PA      . heparin ADULT infusion 100 units/mL (25000 units/250 mL)  1,000 Units/hr Intravenous Continuous Anneta Rounds S Sabre Romberger, MD 10 mL/hr at 01/01/12 0425 1,000 Units/hr at 01/01/12 0425  . heparin bolus via infusion 4,000 Units  4,000 Units Intravenous Once Greg Peters, MD   4,000 Units at 12/31/11 1913  . HYDROcodone-acetaminophen (NORCO) 10-325 MG per tablet 1 tablet  1 tablet Oral Q4H PRN Rhonda G Barrett, PA   1 tablet at 12/31/11 2153  . levothyroxine (SYNTHROID, LEVOTHROID) tablet 112 mcg  112 mcg Oral QAC breakfast Rhonda G Barrett, PA      . metoprolol tartrate (LOPRESSOR) tablet 12.5 mg  12.5 mg Oral Q6H Rhonda G Barrett, PA   12.5 mg at 01/01/12 0521  . nitroGLYCERIN (NITROGLYN) 2 % ointment 1 inch  1 inch  Topical Q6H Miel Wisener S Lisett Dirusso, MD   1 inch at 01/01/12 0431  . nitroGLYCERIN (NITROSTAT) SL tablet 0.4 mg  0.4 mg Sublingual Q5 Min x 3 PRN Rhonda G Barrett, PA      . ondansetron (ZOFRAN) injection 4 mg  4 mg Intravenous Q6H PRN Rhonda G Barrett, PA      . pantoprazole (PROTONIX) EC tablet 80 mg  80 mg Oral Daily Rhonda G Barrett, PA      . pneumococcal 23 valent vaccine (PNU-IMMUNE) injection 0.5 mL  0.5 mL Intramuscular Tomorrow-1000 Latrice Storlie S Bethanny Toelle, MD      . sodium chloride 0.9 % injection 3 mL  3 mL Intravenous Q12H Rhonda G Barrett, PA   3 mL at 01/01/12 0438  . sodium chloride 0.9 % injection 3 mL  3 mL Intravenous PRN Rhonda G Barrett, PA      . Ticagrelor (BRILINTA) tablet 180 mg  180 mg Oral Once Rhonda G Barrett, PA   180 mg at 12/31/11 1912  . Ticagrelor (BRILINTA) tablet 90 mg  90 mg Oral BID Rhonda G Barrett, PA      . zolpidem (  AMBIEN) tablet 5 mg  5 mg Oral QHS PRN Rhonda G Barrett, PA   5 mg at 01/01/12 0004  . DISCONTD: 0.9 %  sodium chloride infusion  250 mL Intravenous PRN Rhonda G Barrett, PA      . DISCONTD: heparin ADULT infusion 100 units/mL (25000 units/250 mL)  12 Units/kg/hr Intravenous Once Greg Peters, MD      . DISCONTD: nitroGLYCERIN (NITROGLYN) 2 % ointment 1 inch  1 inch Topical Q6H Greg Peters, MD   1 inch at 12/31/11 1629  . DISCONTD: sodium chloride 0.9 % injection 3 mL  3 mL Intravenous Q12H Rhonda G Barrett, PA      . DISCONTD: sodium chloride 0.9 % injection 3 mL  3 mL Intravenous PRN Rhonda G Barrett, PA          Filed Vitals:   01/01/12 0415 01/01/12 0440 01/01/12 0520 01/01/12 0521  BP: 112/73  122/88 122/88  Pulse: 68  73 67  Temp:      TempSrc:      Resp: 16  21   Height:      Weight:  89.8 kg (197 lb 15.6 oz)    SpO2: 97%  98%     Intake/Output Summary (Last 24 hours) at 01/01/12 0725 Last data filed at 01/01/12 0700  Gross per 24 hour  Intake 615.98 ml  Output   1100 ml  Net -484.02 ml    LABS: Basic Metabolic Panel:  Basename  01/01/12 0258 12/31/11 1555  NA 141 140  K 3.6 3.8  CL 105 102  CO2 27 27  GLUCOSE 111* 97  BUN 11 12  CREATININE 0.79 0.78  CALCIUM 8.9 8.9  MG 2.0 --  PHOS -- --   Liver Function Tests:  Basename 01/01/12 0258  AST 43*  ALT 34  ALKPHOS 74  BILITOT 0.2*  PROT 6.0  ALBUMIN 3.2*   No results found for this basename: LIPASE:2,AMYLASE:2 in the last 72 hours CBC:  Basename 01/01/12 0258 12/31/11 1555  WBC 10.0 9.9  NEUTROABS -- 5.8  HGB 11.1* 11.5*  HCT 34.6* 35.6*  MCV 96.4 96.7  PLT 291 305   Cardiac Enzymes:  Basename 01/01/12 0259 12/31/11 2120 12/31/11 1559  CKTOTAL 321* 245* 104  CKMB 25.3* 20.0* 4.7*  CKMBINDEX -- -- --  TROPONINI 4.23* 2.91* 0.37*   BNP: No components found with this basename: POCBNP:3 D-Dimer: No results found for this basename: DDIMER:2 in the last 72 hours Hemoglobin A1C: No results found for this basename: HGBA1C in the last 72 hours Fasting Lipid Panel:  Basename 01/01/12 0258  CHOL 228*  HDL 58  LDLCALC 134*  TRIG 181*  CHOLHDL 3.9  LDLDIRECT --   Thyroid Function Tests: No results found for this basename: TSH,T4TOTAL,FREET3,T3FREE,THYROIDAB in the last 72 hours Anemia Panel: No results found for this basename: VITAMINB12,FOLATE,FERRITIN,TIBC,IRON,RETICCTPCT in the last 72 hours  RADIOLOGY: Dg Chest Portable 1 View  12/31/2011  *RADIOLOGY REPORT*  Clinical Data: Chest pain.  Smoker.  History of fibromyalgia.  PORTABLE CHEST - 1 VIEW 12/31/2011 1603 hours:  Comparison: Portable chest x-ray 06/18/2009 [facility] and 01/25/2009 [facility].  Findings: Cardiac silhouette normal and mediastinal contours unremarkable for the AP portable technique.  Lungs clear. Pulmonary vascularity normal.  Bronchovascular markings normal.  No pneumothorax.  No pleural effusions.  IMPRESSION: No acute cardiopulmonary disease.  Original Report Authenticated By: THOMAS E. LAWRENCE, M.D.    PHYSICAL EXAM General: NAD Neck: No JVD, no  thyromegaly or thyroid nodule.    Lungs: Clear to auscultation bilaterally with normal respiratory effort. CV: Nondisplaced PMI.  Heart regular S1/S2, no S3/S4, no murmur.  No peripheral edema.  No carotid bruit.  Normal pedal pulses.  Abdomen: Soft, nontender, no hepatosplenomegaly, no distention.  Neurologic: Alert and oriented x 3.  Psych: Normal affect. Extremities: No clubbing or cyanosis.   TELEMETRY: Reviewed telemetry pt in NSR  ASSESSMENT AND PLAN: 50 yo smoker presented with NSTEMI yesterday.   1. NSTEMI: LHC today.  Brilinta began yesterday (arrived around 5 pm with some ongoing pain, now pain-free).  Continue current meds, will add low dose ACEI.  2. Smoking: Needs to quit.   Lindaann Gradilla 01/01/2012 7:28 AM   

## 2012-01-01 NOTE — ED Provider Notes (Signed)
I have personally seen and examined the patient.  I have discussed the plan of care with the resident.  I have reviewed the documentation on PMH/FH/Soc. History.  I have reviewed the documentation of the resident and agree.  Tukker Byrns, MD 01/01/12 0157 

## 2012-01-01 NOTE — Progress Notes (Signed)
  Echocardiogram 2D Echocardiogram has been performed.  Erin Lynch 01/01/2012, 4:40 PM

## 2012-01-01 NOTE — Progress Notes (Addendum)
Sheath Removal Note  Pre sheath removal vital signs 106/75 Sinus rhythm 78 6 French Sheath removal from right femoral artery at 1532; manual pressure held for 20 minutes. Kathlynn Grate present upon sheath removal   Post sheath removal vital signs remain stable and  unchanged  Right Groin- Level 0- no hematoma, no oozing noted Kathlynn Grate confirms site status. Post Instructions given and patient verbally confirms understanding.  Bedrest begins at 1600.  Patient will be monitored closely by nursing staff.   Pollie Friar Invasive Cardiovascular Specialist Cardiac Cath Lab  [phone]

## 2012-01-01 NOTE — Interval H&P Note (Signed)
History and Physical Interval Note:  01/01/2012 12:03 PM  Erin Lynch  has presented today for surgery, with the diagnosis of chest pain  The various methods of treatment have been discussed with the patient. After consideration of risks, benefits and other options for treatment, the patient has consented to  Procedure(s) (LRB): LEFT HEART CATHETERIZATION WITH CORONARY ANGIOGRAM (N/A) as a surgical intervention .  The patient's history has been reviewed, patient examined, no change in status, stable for surgery.  I have reviewed the patients' chart and labs.  Questions were answered to the patient's satisfaction.     Lorine Bears

## 2012-01-01 NOTE — Care Management Note (Signed)
    Page 1 of 1   01/02/2012     4:07:01 PM   CARE MANAGEMENT NOTE 01/02/2012  Patient:  Erin Lynch, Erin Lynch   Account Number:  [id number]  Date Initiated:  01/01/2012  Documentation initiated by:  Oswell Say  Subjective/Objective Assessment:   PT ADM WITH NSTEMI. PTA, PT INDEPENDENT, LIVES WITH SPOUSE.     Action/Plan:   HUSBAND TO PROVIDE CARE AT DC.  WILL FOLLOW FOR DC NEEDS AS PT PROGRESSES.   Anticipated DC Date:  01/04/2012   Anticipated DC Plan:  HOME/SELF CARE      DC Planning Services  CM consult      Choice offered to / List presented to:             Status of service:  In process, will continue to follow Medicare Important Message given?   (If response is "NO", the following Medicare IM given date fields will be blank) Date Medicare IM given:   Date Additional Medicare IM given:    Discharge Disposition:    Per UR Regulation:    If discussed at Long Length of Stay Meetings, dates discussed:    Comments:  01/02/12 Davidmichael Zarazua,RN,BSN 1230 PT TO DC HOME ON BRILINTA 90MG  BID.  PT GIVEN FREE TRIAL CARD FOR FREE 30 DAY SUPPLY.  VERIFIED THAT [facility] [place], PT'S PHARMACY, HAS DOSE IN STOCK.   PT HAS MEDICAID, AND NEEDS PREAUTH FOR BRILINTA.  RHONDA BARRETT, PA HAS ALREADY CALLED THIS IN PER [facility] PHARMACIST AND AUTH IS PENDING.  PT INSTRUCTED ON FREE TRIAL CARD-VERB UNDERSTANDING.

## 2012-01-01 NOTE — Progress Notes (Signed)
Critical value received: CKMB 20, Troponin 2.91.  MD notified. Leeann Must MD responded, made aware. Jamesyn Lindell, Marzella Schlein

## 2012-01-02 ENCOUNTER — Encounter (HOSPITAL_COMMUNITY): Payer: Self-pay | Admitting: Physician Assistant

## 2012-01-02 DIAGNOSIS — I214 Non-ST elevation (NSTEMI) myocardial infarction: Secondary | ICD-10-CM | POA: Diagnosis present

## 2012-01-02 LAB — CBC
HCT: 34.5 % — ABNORMAL LOW (ref 36.0–46.0)
MCH: 31.1 pg (ref 26.0–34.0)
MCV: 96.6 fL (ref 78.0–100.0)
Platelets: 304 10*3/uL (ref 150–400)
RDW: 13.6 % (ref 11.5–15.5)
WBC: 9 10*3/uL (ref 4.0–10.5)

## 2012-01-02 LAB — BASIC METABOLIC PANEL
BUN: 12 mg/dL (ref 6–23)
Calcium: 9.2 mg/dL (ref 8.4–10.5)
Chloride: 104 mEq/L (ref 96–112)
Creatinine, Ser: 0.84 mg/dL (ref 0.50–1.10)
GFR calc Af Amer: >90 mL/min (ref >90)

## 2012-01-02 MED ORDER — ASPIRIN 81 MG PO TBEC: 81.0000 mg | DELAYED_RELEASE_TABLET | Freq: Every day | ORAL | Status: AC

## 2012-01-02 MED ORDER — NITROGLYCERIN 0.4 MG SL SUBL: 0.4000 mg | SUBLINGUAL_TABLET | SUBLINGUAL | Status: AC | PRN

## 2012-01-02 MED ORDER — METOPROLOL SUCCINATE ER 25 MG PO TB24: 25.0000 mg | ORAL_TABLET | Freq: Every day | ORAL | Status: DC

## 2012-01-02 MED ORDER — ATORVASTATIN CALCIUM 80 MG PO TABS: 80.0000 mg | ORAL_TABLET | Freq: Every day | ORAL | Status: DC

## 2012-01-02 MED ORDER — LISINOPRIL 5 MG PO TABS: 5.0000 mg | ORAL_TABLET | Freq: Every day | ORAL | Status: DC

## 2012-01-02 MED ORDER — LISINOPRIL 5 MG PO TABS
5.0000 mg | ORAL_TABLET | Freq: Every day | ORAL | Status: DC
  Administered 2012-01-02: 5 mg via ORAL
  Filled 2012-01-02: qty 1

## 2012-01-02 MED ORDER — TICAGRELOR 90 MG PO TABS: 90.0000 mg | ORAL_TABLET | Freq: Two times a day (BID) | ORAL | Status: DC

## 2012-01-02 MED ORDER — METOPROLOL SUCCINATE ER 25 MG PO TB24
25.0000 mg | ORAL_TABLET | Freq: Every day | ORAL | Status: DC
  Filled 2012-01-02: qty 1

## 2012-01-02 MED FILL — Dextrose Inj 5%: INTRAVENOUS | Qty: 50 | Status: AC

## 2012-01-02 NOTE — Discharge Summary (Signed)
CARDIOLOGY DISCHARGE SUMMARY   Patient ID: Erin Lynch MRN: [id number] DOB/AGE: [id number] 53 y.o.  Admit date: 12/31/2011 Discharge date: 01/02/2012  Primary Discharge Diagnosis:  NSTEMI - S/P Stent 4.0 x 18 mm vision bare-metal stent to the LAD Secondary Discharge Diagnosis:  Past Medical History  Diagnosis Date  . Fibromyalgia   . Bulging disc   . Arthritis   . Hypothyroid   . Hyperlipidemia LDL goal < 70   . Obesity (BMI 30.0-34.9)   . Tobacco use   . GERD (gastroesophageal reflux disease)    Consults: Tobacco cessation, cardiac rehab.  Procedures: Left Heart Cath, Selective Coronary Angiography, LV angiography, IVUS of LAD and PTCA/Stent of proximal LAD with a bare-metal stent.  Hospital Course: Ms. Weatherbee is a 51 year old female with no previous history of coronary artery disease. She had chest pain and went to her primary care physician when it did not resolve. There, she was treated with nitroglycerin and aspirin. Her pain decreased and EMS transported her to the emergency room. By Windy Fast to the emergency room her pain was improved to a 2/10. Her cardiac enzymes were elevated and she was admitted for further evaluation and treatment.  She ruled in for non-ST segment elevation MI. She was treated medically with aspirin, statin, heparin and nitroglycerin. Her chest pain resolved.  On 01/01/2012, she was taken to the cath lab with results described below. She had a bare-metal stent to the LAD and tolerated the procedure well. Her lipid profile was checked and her LDL was elevated so her statin was changed to high-dose Lipitor. She had a beta blocker and a statin added to her medication regimen as well.  She was seen by smoking cessation and encouraged to abstain from tobacco. She is also encouraged to do outpatient cardiac rehabilitation in [place].   On 01/02/2012, she was seen by Dr. Shirlee Latch and was ambulating without chest pain or shortness of breath. She is considered  stable for discharge, to follow up as an outpatient.     Labs:   Lab Results  Component Value Date   WBC 9.0 01/02/2012   HGB 11.1* 01/02/2012   HCT 34.5* 01/02/2012   MCV 96.6 01/02/2012   PLT 304 01/02/2012    Lab 01/02/12 0510 01/01/12 0258  NA 139 --  K 4.0 --  CL 104 --  CO2 28 --  BUN 12 --  CREATININE 0.84 --  CALCIUM 9.2 --  PROT -- 6.0  BILITOT -- 0.2*  ALKPHOS -- 74  ALT -- 34  AST -- 43*  GLUCOSE 93 --    Basename 01/01/12 0828 01/01/12 0259 12/31/11 2120  CKTOTAL 306* 321* 245*  CKMB 20.5* 25.3* 20.0*  CKMBINDEX -- -- --  TROPONINI 2.89* 4.23* 2.91*   Lipid Panel     Component Value Date/Time   CHOL 228* 01/01/2012 0258   TRIG 181* 01/01/2012 0258   HDL 58 01/01/2012 0258   CHOLHDL 3.9 01/01/2012 0258   VLDL 36 01/01/2012 0258   LDLCALC 134* 01/01/2012 0258    Basename 12/31/11 1717  INR 0.93      Radiology: Dg Chest Portable 1 View 12/31/2011  *RADIOLOGY REPORT*  Clinical Data: Chest pain.  Smoker.  History of fibromyalgia.  PORTABLE CHEST - 1 VIEW 12/31/2011 1603 hours:  Comparison: Portable chest x-ray 06/18/2009 [facility] and 01/25/2009 [facility].  Findings: Cardiac silhouette normal and mediastinal contours unremarkable for the AP portable technique.  Lungs clear. Pulmonary vascularity normal.  Bronchovascular markings normal.  No  pneumothorax.  No pleural effusions.  IMPRESSION: No acute cardiopulmonary disease.  Original Report Authenticated By: Arnell Sieving, M.D.    Cardiac Cath: 01/01/2012 Left Main: Normal.  Left Anterior Descending (LAD): Large in size with a 50% hazy stenosis in the proximal segment. The rest of the vessel is smooth and free of significant disease.  1st diagonal (D1): Large in size and free of significant disease.  2nd diagonal (D2): Small in size.  3rd diagonal (D3): Small in size.  Circumflex (LCx): Small in size and nondominant. It has a very steep takeoff from the left main going back 180. The vessel  has minor irregularities without obstructive disease.  1st obtuse marginal: Very small in size.  2nd obtuse marginal: Very small in size.  3rd obtuse marginal: Normal in size with minor irregularities.  Right Coronary Artery: Very large in size and dominant. The vessel is smooth and has no evidence of atherosclerosis.  posterior descending artery: Normal.  posterior lateral branch: Normal. Left ventriculography: Left ventricular systolic function is low normal , LVEF is estimated at 50-55% %, there is no significant mitral regurgitation . There is moderate hypokinesis of the distal anterior wall, apex and distal inferior wall. PCI Data:  Vessel - proximal LAD/Segment - 12  Percent Stenosis (pre) 50% (evidence of plaque rupture with residuals thrombus and hazy appearance).  TIMI-flow 3  Stent 4.0 x 18 mm vision bare-metal stent postdilated with a 4.5 noncompliant balloon  Percent Stenosis (post) 0  TIMI-flow (post) 3   EKG: 02-Jan-2012 06:52:49 Normal sinus rhythm Nonspecific T wave abnormality Abnormal ECG 25mm/s 55mm/mV 40Hz  8.0.1 12SL 241 HD CID: 0 Referred by: Unconfirmed Vent. rate 64 BPM PR interval 142 ms QRS duration 80 ms QT/QTc 422/435 ms P-R-T axes 40 4 -24  Echo: 01/01/2012 Study Conclusions - Left ventricle: The cavity size was normal. Wall thickness was normal. The estimated ejection fraction was 55%. Wall motion was normal; there were no regional wall motion abnormalities. Features are consistent with a pseudonormal left ventricular filling pattern, with concomitant abnormal relaxation and increased filling pressure (grade 2 diastolic dysfunction). - Atrial septum: There was increased thickness of the septum, consistent with lipomatous hypertrophy.   FOLLOW UP PLANS AND APPOINTMENTS No Known Allergies Medication List  As of 01/02/2012 11:19 AM   STOP taking these medications         simvastatin 40 MG tablet         TAKE these medications          aspirin 81 MG EC tablet   Take 1 tablet (81 mg total) by mouth daily.      atorvastatin 80 MG tablet   Commonly known as: LIPITOR   Take 1 tablet (80 mg total) by mouth daily.      buPROPion 150 MG 24 hr tablet   Commonly known as: WELLBUTRIN XL   Take 150 mg by mouth daily.      esomeprazole 40 MG capsule   Commonly known as: NEXIUM   Take 40 mg by mouth daily before breakfast.      FLUoxetine 40 MG capsule   Commonly known as: PROZAC   Take 40 mg by mouth daily.      HYDROcodone-acetaminophen 10-500 MG per tablet   Commonly known as: LORTAB   Take 1 tablet by mouth every 6 (six) hours as needed. For pain      levothyroxine 112 MCG tablet   Commonly known as: SYNTHROID, LEVOTHROID   Take 112 mcg by mouth  daily.      lisinopril 5 MG tablet   Commonly known as: PRINIVIL,ZESTRIL   Take 1 tablet (5 mg total) by mouth daily.      metoprolol succinate 25 MG 24 hr tablet   Commonly known as: TOPROL-XL   Take 1 tablet (25 mg total) by mouth daily.      nitroGLYCERIN 0.4 MG SL tablet   Commonly known as: NITROSTAT   Place 1 tablet (0.4 mg total) under the tongue every 5 (five) minutes as needed for chest pain.      Ticagrelor 90 MG Tabs tablet   Commonly known as: BRILINTA   Take 1 tablet (90 mg total) by mouth 2 (two) times daily.            Discharge Orders    Future Appointments: Provider: Department: Dept Phone: Center:   01/14/2012 8:30 AM Laurey Morale, MD [facility] [phone] [facility]     Follow-up Information    Follow up with Marca Ancona, MD. (July 2nd at 8:30 am)    Contact information:   [address] [place] [address] [phone]          BRING ALL MEDICATIONS WITH YOU TO FOLLOW UP APPOINTMENTS  Time spent with patient to include physician time: 40 min Signed: Theodore Demark 01/02/2012, 11:19 AM Co-Sign MD

## 2012-01-02 NOTE — Discharge Instructions (Signed)
PLEASE REMEMBER TO BRING ALL OF YOUR MEDICATIONS TO EACH OF YOUR FOLLOW-UP OFFICE VISITS.  PLEASE ATTEND ALL SCHEDULED FOLLOW-UP APPOINTMENTS.   Activity: Increase activity slowly as tolerated. You may shower, but no soaking baths (or swimming) for 1 week. No driving for 2 days. No lifting over 5 lbs for 1 week. No sexual activity for 1 week.   You May Return to Work: in 3 weeks (if applicable)  Wound Care: You may wash cath site gently with soap and water. Keep cath site clean and dry. If you notice pain, swelling, bleeding or pus at your cath site, please call [phone].    Groin Site Care Refer to this sheet in the next few weeks. These instructions provide you with information on caring for yourself after your procedure. Your caregiver may also give you more specific instructions. Your treatment has been planned according to current medical practices, but problems sometimes occur. Call your caregiver if you have any problems or questions after your procedure. HOME CARE INSTRUCTIONS  You may shower 24 hours after the procedure. Remove the bandage (dressing) and gently wash the site with plain soap and water. Gently pat the site dry.   Do not apply powder or lotion to the site.   Do not sit in a bathtub, swimming pool, or whirlpool for 5 to 7 days.   No bending, squatting, or lifting anything over 10 pounds (4.5 kg) as directed by your caregiver.   Inspect the site at least twice daily.   Do not drive home if you are discharged the same day of the procedure. Have someone else drive you.   You may drive 24 hours after the procedure unless otherwise instructed by your caregiver.  What to expect:  Any bruising will usually fade within 1 to 2 weeks.   Blood that collects in the tissue (hematoma) may be painful to the touch. It should usually decrease in size and tenderness within 1 to 2 weeks.  SEEK IMMEDIATE MEDICAL CARE IF:  You have unusual pain at the groin site or down the  affected leg.   You have redness, warmth, swelling, or pain at the groin site.   You have drainage (other than a small amount of blood on the dressing).   You have chills.   You have a fever or persistent symptoms for more than 72 hours.   You have a fever and your symptoms suddenly get worse.   Your leg becomes pale, cool, tingly, or numb.   You have heavy bleeding from the site. Hold pressure on the site.  Document Released: 08/03/2010 Document Revised: 06/20/2011 Document Reviewed: 08/03/2010 [facility] Patient Information 2012 [place], [place]. Cholesterol Control Diet Cholesterol levels in your body are determined significantly by your diet. Cholesterol levels may also be related to heart disease. The following material helps to explain this relationship and discusses what you can do to help keep your heart healthy. Not all cholesterol is bad. Low-density lipoprotein (LDL) cholesterol is the "bad" cholesterol. It may cause fatty deposits to build up inside your arteries. High-density lipoprotein (HDL) cholesterol is "good." It helps to remove the "bad" LDL cholesterol from your blood. Cholesterol is a very important risk factor for heart disease. Other risk factors are high blood pressure, smoking, stress, heredity, and weight. The heart muscle gets its supply of blood through the coronary arteries. If your LDL cholesterol is high and your HDL cholesterol is low, you are at risk for having fatty deposits build up in your coronary  arteries. This leaves less room through which blood can flow. Without sufficient blood and oxygen, the heart muscle cannot function properly and you may feel chest pains (angina pectoris). When a coronary artery closes up entirely, a part of the heart muscle may die, causing a heart attack (myocardial infarction). CHECKING CHOLESTEROL When your caregiver sends your blood to a lab to be analyzed for cholesterol, a complete lipid (fat) profile may be done. With this  test, the total amount of cholesterol and levels of LDL and HDL are determined. Triglycerides are a type of fat that circulates in the blood and can also be used to determine heart disease risk. The list below describes what the numbers should be: Test: Total Cholesterol.  Less than 200 mg/dl.  Test: LDL "bad cholesterol."  Less than 100 mg/dl.   Less than 70 mg/dl if you are at very high risk of a heart attack or sudden cardiac death.  Test: HDL "good cholesterol."  Greater than 50 mg/dl for women.   Greater than 40 mg/dl for men.  Test: Triglycerides.  Less than 150 mg/dl.  CONTROLLING CHOLESTEROL WITH DIET Although exercise and lifestyle factors are important, your diet is key. That is because certain foods are known to raise cholesterol and others to lower it. The goal is to balance foods for their effect on cholesterol and more importantly, to replace saturated and trans fat with other types of fat, such as monounsaturated fat, polyunsaturated fat, and omega-3 fatty acids. On average, a person should consume no more than 15 to 17 g of saturated fat daily. Saturated and trans fats are considered "bad" fats, and they will raise LDL cholesterol. Saturated fats are primarily found in animal products such as meats, butter, and cream. However, that does not mean you need to sacrifice all your favorite foods. Today, there are good tasting, low-fat, low-cholesterol substitutes for most of the things you like to eat. Choose low-fat or nonfat alternatives. Choose round or loin cuts of red meat, since these types of cuts are lowest in fat and cholesterol. Chicken (without the skin), fish, veal, and ground [place] breast are excellent choices. Eliminate fatty meats, such as hot dogs and salami. Even shellfish have little or no saturated fat. Have a 3 oz (85 g) portion when you eat lean meat, poultry, or fish. Trans fats are also called "partially hydrogenated oils." They are oils that have been  scientifically manipulated so that they are solid at room temperature resulting in a longer shelf life and improved taste and texture of foods in which they are added. Trans fats are found in stick margarine, some tub margarines, cookies, crackers, and baked goods.  When baking and cooking, oils are an excellent substitute for butter. The monounsaturated oils are especially beneficial since it is believed they lower LDL and raise HDL. The oils you should avoid entirely are saturated tropical oils, such as coconut and palm.  Remember to eat liberally from food groups that are naturally free of saturated and trans fat, including fish, fruit, vegetables, beans, grains (barley, rice, couscous, bulgur wheat), and pasta (without cream sauces).  IDENTIFYING FOODS THAT LOWER CHOLESTEROL  Soluble fiber may lower your cholesterol. This type of fiber is found in fruits such as apples, vegetables such as broccoli, potatoes, and carrots, legumes such as beans, peas, and lentils, and grains such as barley. Foods fortified with plant sterols (phytosterol) may also lower cholesterol. You should eat at least 2 g per day of these foods for a cholesterol  lowering effect.  Read package labels to identify low-saturated fats, trans fats free, and low-fat foods at the supermarket. Select cheeses that have only 2 to 3 g saturated fat per ounce. Use a heart-healthy tub margarine that is free of trans fats or partially hydrogenated oil. When buying baked goods (cookies, crackers), avoid partially hydrogenated oils. Breads and muffins should be made from whole grains (whole-wheat or whole oat flour, instead of "flour" or "enriched flour"). Buy non-creamy canned soups with reduced salt and no added fats.  FOOD PREPARATION TECHNIQUES  Never deep-fry. If you must fry, either stir-fry, which uses very little fat, or use non-stick cooking sprays. When possible, broil, bake, or roast meats, and steam vegetables. Instead of dressing  vegetables with butter or margarine, use lemon and herbs, applesauce and cinnamon (for squash and sweet potatoes), nonfat yogurt, salsa, and low-fat dressings for salads.  LOW-SATURATED FAT / LOW-FAT FOOD SUBSTITUTES Meats / Saturated Fat (g)  Avoid: Steak, marbled (3 oz/85 g) / 11 g   Choose: Steak, lean (3 oz/85 g) / 4 g   Avoid: Hamburger (3 oz/85 g) / 7 g   Choose: Hamburger, lean (3 oz/85 g) / 5 g   Avoid: Ham (3 oz/85 g) / 6 g   Choose: Ham, lean cut (3 oz/85 g) / 2.4 g   Avoid: Chicken, with skin, dark meat (3 oz/85 g) / 4 g   Choose: Chicken, skin removed, dark meat (3 oz/85 g) / 2 g   Avoid: Chicken, with skin, light meat (3 oz/85 g) / 2.5 g   Choose: Chicken, skin removed, light meat (3 oz/85 g) / 1 g  Dairy / Saturated Fat (g)  Avoid: Whole milk (1 cup) / 5 g   Choose: Low-fat milk, 2% (1 cup) / 3 g   Choose: Low-fat milk, 1% (1 cup) / 1.5 g   Choose: Skim milk (1 cup) / 0.3 g   Avoid: Hard cheese (1 oz/28 g) / 6 g   Choose: Skim milk cheese (1 oz/28 g) / 2 to 3 g   Avoid: Cottage cheese, 4% fat (1 cup) / 6.5 g   Choose: Low-fat cottage cheese, 1% fat (1 cup) / 1.5 g   Avoid: Ice cream (1 cup) / 9 g   Choose: Sherbet (1 cup) / 2.5 g   Choose: Nonfat frozen yogurt (1 cup) / 0.3 g   Choose: Frozen fruit bar / trace   Avoid: Whipped cream (1 tbs) / 3.5 g   Choose: Nondairy whipped topping (1 tbs) / 1 g  Condiments / Saturated Fat (g)  Avoid: Mayonnaise (1 tbs) / 2 g   Choose: Low-fat mayonnaise (1 tbs) / 1 g   Avoid: Butter (1 tbs) / 7 g   Choose: Extra light margarine (1 tbs) / 1 g   Avoid: Coconut oil (1 tbs) / 11.8 g   Choose: Olive oil (1 tbs) / 1.8 g   Choose: Corn oil (1 tbs) / 1.7 g   Choose: Safflower oil (1 tbs) / 1.2 g   Choose: Sunflower oil (1 tbs) / 1.4 g   Choose: Soybean oil (1 tbs) / 2.4 g   Choose: Canola oil (1 tbs) / 1 g  Document Released: 07/01/2005 Document Revised: 06/20/2011 Document Reviewed:  12/20/2010 [facility] Patient Information 2012 [employer], [employer]. Myocardial Infarction A myocardial infarction (MI) is damage to the heart that is not reversible. It is also called a heart attack. An MI usually occurs when a heart (coronary) artery  becomes blocked or narrowed. This cuts off the blood supply to the heart. When one or more of the heart (coronary) arteries becomes blocked, that area of the heart begins to die. This causes pain felt during an MI.  If you think you might be having an MI, call your local emergency services immediately (911 in [place] It is recommended that you take a 162 mg non-enteric coated aspirin if you do not have an aspirin allergy. Do not drive yourself to the hospital or wait to see if your symptoms go away. The sooner MI is treated, the greater the amount of heart muscle saved. Time is muscle. It can save your life. CAUSES  An MI can occur from:  A gradual buildup of a fatty substance called plaque. When plaque builds up in the arteries, this condition is called atherosclerosis. This buildup can block or reduce the blood supply to the heart artery(s).   A sudden plaque rupture within a heart artery that causes a blood clot (thrombus). A blood clot can block the heart artery which does not allow blood flow to the heart.   A severe tightening (spasm) of the heart artery. This is a less common cause of a heart attack. When a heart artery spasms, it cuts off blood flow through the artery. Spasms can occur in heart arteries that do not have atherosclerosis.  RISK FACTORS People at risk for an MI usually have one or more risk factors, such as:  High blood pressure.   High cholesterol.   Smoking.   Gender. Men have a higher heart attack risk.   Overweight/obesity.   Age.   Family history.   Lack of exercise.   Diabetes.   Stress.   Excessive alcohol use.   Street drug use (cocaine and methamphetamines).  SYMPTOMS  MI symptoms can vary, such as:  In  both men and women, MI symptoms can include the following:   Chest pain. The chest pain may feel like a crushing, squeezing, or "pressure" type feeling. MI pain can be "referred," meaning pain can be caused in one part of the body but felt in another part of the body. Referred MI pain may occur in the left arm, neck, or jaw. Pain may even be felt in the right arm.   Shortness of breath (dyspnea).   Heartburn or indigestion with or without vomiting, shortness of breath, or sweating (diaphoresis).   Sudden, cold sweats.   Sudden lightheadedness.   Upper back pain.   Women can have unique MI symptoms, such as:   Unexplained feelings of nervousness or anxiety.   Discomfort between the shoulder blades (scapula) or upper back.   Tingling in the hands and arms.   In elderly people (regardless of gender), MI symptoms can be subtle, such as:   Sweating (diaphoresis).   Shortness of breath (dyspnea).   General tiredness (fatigue) or not feeling well (malaise).  DIAGNOSIS  Diagnosis of an MI involves several tests such as:  An assessment of your vital signs such as heart rhythm, blood pressure, respiratory rate, and oxygen level.   An EKG (ECG) to look at the electrical activity of your heart.   Blood tests called cardiac markers are drawn at scheduled times to measure proteins or enzymes released by the damaged heart muscle.   A chest X-ray.   An echocardiogram to evaluate heart motion and blood flow.   Coronary angiography (cardiac catheterization). This is a diagnostic procedure to look at the heart arteries.  TREATMENT  Acute Intervention. For an MI, the national standard in the [place] is to have an acute intervention in under 90 minutes from the time you get to the hospital. An acute intervention is a special procedure to open up the heart arteries. It is done in a treatment room called a "catheterization lab" (cath lab). Some hospitals do no have a cath lab. If you are  having an MI and the hospital does not have a cath lab, the standard is to transport you to a hospital that has one. In the cath lab, acute intervention includes:  Angioplasty. An angioplasty involves inserting a thin, flexible tube (catheter) into an artery in either your groin or wrist. The catheter is threaded to the heart arteries. A balloon at the end of the catheter is inflated to open a narrowed or blocked heart artery. During an angioplasty procedure, a small mesh tube (stent) may be used to keep the heart artery open. Depending on your condition and health history, one of two types of stents may be placed:   Drug-eluting stent (DES). A DES is coated with a medicine to prevent scar tissue from growing over the stent. With drug-eluting stents, blood thinning medicine will need to be taken for up to a year.   Bare metal stent. This type of stent has no special coating to keep tissue from growing over it. This type of stent is used if you cannot take blood thinning medicine for a prolonged time or you need surgery in the near future. After a bare metal stent is placed, blood thinning medicine will need to be taken for about a month.   If you are taking blood thinning medicine (anti-platelet therapy) after stent placement, do not stop taking it unless your caregiver says it is okay to do so. Make sure you understand how long you need to take the medicine.  Surgical Intervention  If an acute intervention is not successful, surgery may be needed:   Open heart surgery (coronary artery bypass graft, CABG). CABG takes a vein (saphenous vein) from your leg. The vein is then attached to the blocked heart artery which bypasses the blockage. This then allows blood flow to the heart muscle.  Additional Interventions  A "clot buster" medicine (thrombolytic) may be given. This medicine can help break up a clot in the heart artery. This medicine may be given if a person cannot get to a cath lab right away.    Intra-aortic balloon pump (IABP). If you have suffered a very severe MI and are too unstable to go to the cath lab or to surgery, an IABP may be used. This is a temporary mechanical device used to increase blood flow to the heart and reduce the workload of the heart until you are stable enough to go to the cath lab or surgery.  HOME CARE INSTRUCTIONS After an MI, you may need the following:  Medication. Take medication as directed by your caregiver. Medications after an MI may:   Keep your blood from clotting easily (blood thinners).   Control your blood pressure.   Help lower your cholesterol.   Control abnormal heart rhythms.   Lifestyle changes. Under the guidance of your caregiver, lifestyle changes include:   Quitting smoking, if you smoke. Your caregiver can help you quit.   Being physically active.   Maintaining a healthy weight.   Eating a heart healthy diet. A dietician can help you learn healthy eating options.   Managing diabetes.   Reducing stress.  Limiting alcohol intake.  SEEK IMMEDIATE MEDICAL CARE IF:   You have severe chest pain, especially if the pain is crushing or pressure-like and spreads to the arms, back, neck, or jaw. This is an emergency. Do not wait to see if the pain will go away. Get medical help at once. Call your local emergency services (911 in the [place] Do not drive yourself to the hospital.   You have shortness of breath during rest, sleep, or with activity.   You have sudden sweating or clammy skin.   You feel sick to your stomach (nauseous) and throw up (vomit).   You suddenly become lightheaded or dizzy.   You feel your heart beating rapidly or you notice "skipped" beats.  MAKE SURE YOU:   Understand these instructions.   Will watch your condition.   Will get help right away if you are not doing well or get worse.  Document Released: 07/01/2005 Document Revised: 06/20/2011 Document Reviewed: 11/28/2010 [facility] Patient  Information 2012 [place], [place]. Non-ST Segment Elevation Myocardial Infarction  A heart attack occurs when a blood vessel on the surface of the heart (coronary artery) is blocked and interrupts blood supply to the heart muscle. This causes that area of the heart muscle to permanently scar. This blockage may be caused by cholesterol buildup (atherosclerotic plaque) within a coronary artery. The plaque cracks which creates a rough surface where blood cells attach, forming a clot. Chest discomfort that happens with exertion and goes away with rest is called angina. This is a warning signal that blood flow to the heart is not enough. Angina that does not go away or becomes worse may mean that there is actual heart damage and a scar may form. ST elevation refers to waveforms seen on an EKG or tracing of the electrical activity in the heart. Your provider can tell if there has been damage to your heart from changes in the normal pattern. A NSTEMI heart attack may be smaller and not as serious as one with typical changes. After an NSTEMI, there is a higher chance of another heart attack and returning angina after you have recovered. CAUSES  Plaque in the coronary arteries. It builds up over many years.   Smoking. Smoking reduces the oxygen supply to the heart because carbon monoxide is more readily carried by the blood cells. Smoking also makes plaque develop faster. STOP SMOKING.   A narrowing (spasm) of a coronary artery.   Increased oxygen use. This can occur during extreme stress or activities.   Use of stimulants. Stimulants, such as cocaine and amphetamines, dangerously and unpredictably increase the oxygen needs of the heart. They can make the heart beat faster and unevenly. Stop using street drugs.  RISK FACTORS  Age-Risk increases with age for both men and women.   Menopause-After menopause and age 6, women have heart attacks at the same rate as men.   Diabetes-Maintaining a normal blood  sugar and eating a balanced diet lessens the chance of a heart attack.   Obesity-Try to maintain a close to ideal body weight or as your caregiver suggests.   High blood pressure (hypertension)-Makes the heart work harder.   High cholesterol-Promotes the buildup of plaque in the blood vessels.  SYMPTOMS  Chest pain, especially if it radiates down the left arm, up into the neck, jaw or teeth, often comes from the heart. This is even more likely if the pain leaves with rest. Problems with the heart may mimic indigestion and anyone older than  35 should not ignore this symptom. Other typical problems (symptoms) include:  Profound sweating.   Feeling faint, weak or light-headed.   Feeling sick to your stomach.   Loss of normal color.  DIAGNOSIS  A combination of your history, an exam, EKG findings and blood work results determine if you have had a heart attack. It is very important to seek medical care right away for episodes of chest pain. The sooner you get treatment, the sooner you may return to your normal activities. If a large area of heart muscle lacks oxygen and medical care is not provided; a weak heart muscle, heart failure or sudden death may result. WHAT MAY HAPPEN IF A HEART ATTACK IS SUSPECTED  Aspirin may be given, if you are able to take it. This makes your blood "thinner" (less likely to clot).   Thrombolytics ("clot busters") may be given as long as it is safe and if a cardiac cath lab is not available.   A heart monitor will display the electrical activity of your heart to check for abnormal beats or rhythm.   An EKG is a painless procedure that gives information about areas of heart muscle that may be injured.   Your blood oxygen level may be monitored by a painless sensor attached to your finger or ear.   Blood tests are used to find out whether the heart muscle has been damaged.   A chest X-ray can give some indication of how well the heart and lungs are  functioning.   A coronary angiogram may be performed. This is a procedure where dye is injected into your coronary arteries and x-rays are taken to determine which blood vessels are blocked.   Angioplasty or stent placement may be used during an angiogram to open a blocked vessel.   An echocardiogram is a painless and risk free external sonar exam that may be used to examine your heart valves, muscle function and blood flow within the heart.  TREATMENT  Length of hospital stays vary from a couple days to a week. This depends on the amount of heart damage and the severity of any complications.   If your symptoms are a false alarm and no heart disease is found, the stay is often less than 24 hours.   Medications may be used for reducing pain, keeping your heart beat regularly, helping your breathing and controlling your blood pressure.   Blood thinners may be used to dissolve clots.   If you have a single small artery blockage and no heart damage, you may have a balloon angioplasty. This procedure may displace the blockage and restore normal heart circulation. Stents most often follow angioplasty right away. Stents are small wire mesh-like tubes that help keep the artery open. The earlier this is done, the better.   Severe heart problems may require open heart surgery. This is a procedure where blocked arteries are bypassed with small veins from your legs or arteries from inside your chest wall. If important arteries are involved, or if your chest pain continues, this may be the best method to make ensure your long-term survival.  ABOUT [facility] STAY  While you are in the hospital, you may be placed on a low salt, low fat diet and given a stool softener. The stool softener will keep you from straining during a bowel movement.   Oxygen may be given to increase oxygen delivery to the heart.   Medications may be prescribed, while in the hospital, to help your  heart and lungs work better.     Before discharge from the hospital, a stress test may be performed. In this test, an ECG measures how well your heart works with exercise. The test may be done while you are walking on a treadmill, using a stationary bicycle or after being given medications to make your heart beat faster. It can be used to judge the safety of your proposed activity levels. It may provide a starting point for your exercise program.  HOME CARE INSTRUCTIONS   Follow the treatment plan your caregiver prescribes.   Carry medications, such as nitroglycerine, with you at all times, if directed.   Make a list of every medicine you are taking. Keep it up-to-date and with you all the time.   Get help from your caregiver or pharmacist to learn the following about each medicine:   Why you are taking it.   What time of day to take it.   Possible side effects.   Foods to take with it or avoid.   When to stop taking it.   Try to maintain normal blood lipid levels.   Eat a heart healthy diet with salt and fat restrictions as advised.   Activity Level-Everyone heals at a different rate. Decisions about when you may go back to work, start to exercise or have sex should be made with the guidance of your caregivers. Pace your activities to avoid shortness of breath or chest pain.   Weight Monitoring-Weigh yourself every day. You should weigh yourself in the morning after you urinate and before you eat breakfast. Wear the same amount of clothing when you weigh yourself. Record your weight daily. Bring your recorded weights to your clinic visits. Tell your caregiver right away if you have gained 3 lb/1.4 kg in 1 day or 5 lb/2.3 kg in a week.   Blood pressure monitoring-This should be done as often as you are told to check it. You can get a home blood pressure cuff at your drugstore. Record these values and bring them with you for your health checks.   Smoking-If you are currently a smoker, it is time to quit. Nicotine  makes your heart work harder. Do not use nicotine gum or patches before checking with your doctor. Find a support group or therapist to help you quit.   Follow-up-Be sure to make and keep an appointment with your caregiver. Appointments with your cardiologist and other caregivers may also be needed.  SEEK IMMEDIATE MEDICAL CARE IF:   You have severe chest pain, especially if the pain is crushing or pressure-like and spreads to the arms, back, neck or jaw. THIS IS AN EMERGENCY. Do not wait to see if the pain will go away. Get medical help at once. Call your local emergency services (911 in the [place] DO NOT drive yourself to the hospital.   You start sweating, feel sick to your stomach or are short of breath.   Your weight increases by 3 lb/1.4 kg or more in 1 day or 5 lb/2.3 kg in a week.   You notice increasing shortness of breath during rest, sleeping or with activity.   You develop an increase in angina or develop chest pain which is unusual for you.   You are unable to sleep because you cannot breathe.  MAKE SURE YOU:   Understand these instructions.   Will watch your condition.   Will get help right away if you are not doing well or get worse.  Document Released:  01/28/2005 Document Revised: 06/20/2011 Document Reviewed: 08/28/2007 [facility] Patient Information 2012 [place], [place].

## 2012-01-02 NOTE — Progress Notes (Signed)
CARDIAC REHAB PHASE I   PRE:  Rate/Rhythm: 71 SR    BP: sitting 95/74    SaO2:   MODE:  Ambulation: 350 ft   POST:  Rate/Rhythm: 92    BP: sitting 105/72     SaO2:   Quick pace walk, pt exerted with SOB, no CP. VSS although pt is deconditioned. Admittedly has gained 50 lbs in 6 mos due to eating excessively and inactivity. Sts she has been depressed. Long discussion of this and changing habits/mental state with ex and productivity. Requests name be sent to [facility]. Sts quitting smoking is no problem although husband smokes inside so discussed this as well.  [id number] Harriet Masson CES, ACSM

## 2012-01-02 NOTE — Progress Notes (Signed)
Patient ID: Erin Lynch, female   DOB: 13-Aug-1960, 51 y.o.   MRN: [id number]     SUBJECTIVE: No chest pain or dyspnea.   Current Facility-Administered Medications  Medication Dose Route Frequency Provider Last Rate Last Dose  . 0.9 %  sodium chloride infusion  250 mL Intravenous PRN Joline Salt Barrett, PA   250 mL at 12/31/11 2000  . 0.9 %  sodium chloride infusion   Intravenous Continuous Iran Ouch, MD 100 mL/hr at 01/01/12 1602 100 mL/hr at 01/01/12 1602  . acetaminophen (TYLENOL) tablet 650 mg  650 mg Oral Q4H PRN Darrol Jump, PA      . ALPRAZolam Prudy Feeler) tablet 1 mg  1 mg Oral QID PRN Laurey Morale, MD   1 mg at 01/02/12 0520  . aspirin EC tablet 81 mg  81 mg Oral Daily Joline Salt Barrett, PA   81 mg at 01/01/12 0856  . atorvastatin (LIPITOR) tablet 80 mg  80 mg Oral q1800 Joline Salt Barrett, PA   80 mg at 01/01/12 1725  . bivalirudin (ANGIOMAX) 250 MG injection           . buPROPion (WELLBUTRIN XL) 24 hr tablet 150 mg  150 mg Oral Daily Joline Salt Barrett, PA   150 mg at 01/01/12 0858  . diazepam (VALIUM) tablet 5 mg  5 mg Oral On Call Joline Salt Barrett, PA   5 mg at 01/01/12 1140  . fentaNYL (SUBLIMAZE) 0.05 MG/ML injection           . fentaNYL (SUBLIMAZE) 0.05 MG/ML injection           . FLUoxetine (PROZAC) capsule 40 mg  40 mg Oral Daily Joline Salt Barrett, PA   40 mg at 01/01/12 0857  . heparin 1000 UNIT/ML injection           . heparin 2-0.9 UNIT/ML-% infusion           . HYDROcodone-acetaminophen (NORCO) 10-325 MG per tablet 1 tablet  1 tablet Oral Q4H PRN Darrol Jump, PA   1 tablet at 01/02/12 0521  . levothyroxine (SYNTHROID, LEVOTHROID) tablet 112 mcg  112 mcg Oral QAC breakfast Joline Salt Barrett, PA   112 mcg at 01/01/12 0857  . lidocaine (XYLOCAINE) 1 % injection           . lisinopril (PRINIVIL,ZESTRIL) tablet 5 mg  5 mg Oral Daily Laurey Morale, MD      . metoprolol succinate (TOPROL-XL) 24 hr tablet 25 mg  25 mg Oral Daily Laurey Morale, MD      . midazolam  (VERSED) 2 MG/2ML injection           . midazolam (VERSED) 2 MG/2ML injection           . morphine 2 MG/ML injection 2 mg  2 mg Intravenous Q2H PRN Iran Ouch, MD      . nitroGLYCERIN (NITROSTAT) SL tablet 0.4 mg  0.4 mg Sublingual Q5 Min x 3 PRN Rhonda G Barrett, PA      . nitroGLYCERIN (NTG ON-CALL) 0.2 mg/mL injection           . ondansetron (ZOFRAN) injection 4 mg  4 mg Intravenous Q6H PRN Joline Salt Barrett, PA      . pantoprazole (PROTONIX) EC tablet 80 mg  80 mg Oral Daily Joline Salt Barrett, PA   80 mg at 01/01/12 0857  . pneumococcal 23 valent vaccine (PNU-IMMUNE) injection 0.5 mL  0.5 mL Intramuscular Tomorrow-1000  Laurey Morale, MD      . sodium chloride 0.9 % injection 3 mL  3 mL Intravenous Q12H Joline Salt Barrett, PA   3 mL at 01/01/12 2020  . sodium chloride 0.9 % injection 3 mL  3 mL Intravenous PRN Darrol Jump, PA      . Ticagrelor (BRILINTA) tablet 90 mg  90 mg Oral BID Joline Salt Barrett, PA   90 mg at 01/01/12 2200  . verapamil (ISOPTIN) 2.5 MG/ML injection           . zolpidem (AMBIEN) tablet 5 mg  5 mg Oral QHS PRN Darrol Jump, PA   5 mg at 01/01/12 2019  . DISCONTD: 0.9 %  sodium chloride infusion   Intravenous Continuous Joline Salt Barrett, PA 75 mL/hr at 01/01/12 0438    . DISCONTD: ALPRAZolam Prudy Feeler) tablet 0.25 mg  0.25 mg Oral BID PRN Joline Salt Barrett, PA   0.25 mg at 01/01/12 0521  . DISCONTD: heparin ADULT infusion 100 units/mL (25000 units/250 mL)  1,000 Units/hr Intravenous Continuous Laurey Morale, MD 10 mL/hr at 01/01/12 0425 1,000 Units/hr at 01/01/12 0425  . DISCONTD: metoprolol tartrate (LOPRESSOR) tablet 12.5 mg  12.5 mg Oral Q6H Rhonda G Barrett, PA   12.5 mg at 01/02/12 0521  . DISCONTD: nitroGLYCERIN (NITROGLYN) 2 % ointment 1 inch  1 inch Topical Q6H Laurey Morale, MD   1 inch at 01/02/12 0600  . DISCONTD: pneumococcal 23 valent vaccine (PNU-IMMUNE) injection 0.5 mL  0.5 mL Intramuscular Tomorrow-1000 Laurey Morale, MD          Filed  Vitals:   01/02/12 0100 01/02/12 0300 01/02/12 0421 01/02/12 0500  BP: 92/53 115/80 141/86 117/62  Pulse: 67 66 71 72  Temp:   97.6 F (36.4 C)   TempSrc:   Oral   Resp:   19   Height:      Weight:    90.2 kg (198 lb 13.7 oz)  SpO2: 95% 96% 98% 98%    Intake/Output Summary (Last 24 hours) at 01/02/12 0729 Last data filed at 01/02/12 0000  Gross per 24 hour  Intake   1735 ml  Output   2675 ml  Net   -940 ml    LABS: Basic Metabolic Panel:  Basename 01/02/12 0510 01/01/12 0258  NA 139 141  K 4.0 3.6  CL 104 105  CO2 28 27  GLUCOSE 93 111*  BUN 12 11  CREATININE 0.84 0.79  CALCIUM 9.2 8.9  MG -- 2.0  PHOS -- --   Liver Function Tests:  [facility] 01/01/12 0258  AST 43*  ALT 34  ALKPHOS 74  BILITOT 0.2*  PROT 6.0  ALBUMIN 3.2*   No results found for this basename: LIPASE:2,AMYLASE:2 in the last 72 hours CBC:  Basename 01/02/12 0510 01/01/12 0258 12/31/11 1555  WBC 9.0 10.0 --  NEUTROABS -- -- 5.8  HGB 11.1* 11.1* --  HCT 34.5* 34.6* --  MCV 96.6 96.4 --  PLT 304 291 --   Cardiac Enzymes:  Basename 01/01/12 0828 01/01/12 0259 12/31/11 2120  CKTOTAL 306* 321* 245*  CKMB 20.5* 25.3* 20.0*  CKMBINDEX -- -- --  TROPONINI 2.89* 4.23* 2.91*   BNP: No components found with this basename: POCBNP:3 D-Dimer: No results found for this basename: DDIMER:2 in the last 72 hours Hemoglobin A1C: No results found for this basename: HGBA1C in the last 72 hours Fasting Lipid Panel:  Basename 01/01/12 0258  CHOL 228*  HDL  58  LDLCALC 134*  TRIG 181*  CHOLHDL 3.9  LDLDIRECT --   Thyroid Function Tests:  Basename 01/01/12 0258  TSH 1.427  T4TOTAL --  T3FREE --  THYROIDAB --   Anemia Panel: No results found for this basename: VITAMINB12,FOLATE,FERRITIN,TIBC,IRON,RETICCTPCT in the last 72 hours  RADIOLOGY: Dg Chest Portable 1 View  12/31/2011  *RADIOLOGY REPORT*  Clinical Data: Chest pain.  Smoker.  History of fibromyalgia.  PORTABLE CHEST - 1 VIEW  12/31/2011 1603 hours:  Comparison: Portable chest x-ray 06/18/2009 [facility] and 01/25/2009 [facility].  Findings: Cardiac silhouette normal and mediastinal contours unremarkable for the AP portable technique.  Lungs clear. Pulmonary vascularity normal.  Bronchovascular markings normal.  No pneumothorax.  No pleural effusions.  IMPRESSION: No acute cardiopulmonary disease.  Original Report Authenticated By: Arnell Sieving, M.D.    PHYSICAL EXAM General: NAD Neck: No JVD, no thyromegaly or thyroid nodule.  Lungs: Clear to auscultation bilaterally with normal respiratory effort. CV: Nondisplaced PMI.  Heart regular S1/S2, no S3/S4, no murmur.  No peripheral edema.  No carotid bruit.  Normal pedal pulses.  Abdomen: Soft, nontender, no hepatosplenomegaly, no distention.  Neurologic: Alert and oriented x 3.  Psych: Normal affect. Extremities: No clubbing or cyanosis.  Right groin cath site benign.   TELEMETRY: Reviewed telemetry pt in NSR  ASSESSMENT AND PLAN: 51 yo smoker presented with NSTEMI.   1. NSTEMI: BMS to LAD yesterday (50% stenosis but hazy with evidence for plaque rupture by IVUS).  EF 50-55% by LV-gram with some apical hypokinesis.  Echo with EF 55%. - Brilinta x 1 year - ASA 81, statin, adding lisinopril, consolidate to Toprol XL.  - Will consider cardiac rehab at hospital in [place].   2. Smoking: Needs to quit.  3. Disposition: Ambulate this morning.  Home in the afternoon if stable.  Meds: ASA 81, Brilinta 90 mg bid, atorvastatin 80, lisinopril 5 mg daily, Toprol XL 25 mg daily.  Followup with me in 2 wks.  Cardiac rehab in [place].   Marca Ancona 01/02/2012 7:29 AM

## 2012-01-03 ENCOUNTER — Encounter: Payer: Self-pay | Admitting: *Deleted

## 2012-01-14 ENCOUNTER — Encounter: Payer: Medicaid Other | Admitting: Cardiology

## 2012-01-14 ENCOUNTER — Emergency Department (HOSPITAL_COMMUNITY)
Admission: EM | Admit: 2012-01-14 | Discharge: 2012-01-15 | Disposition: A | Discharge status: Home / Self Care | Payer: Medicaid Other | Attending: Emergency Medicine | Admitting: Emergency Medicine

## 2012-01-14 ENCOUNTER — Encounter: Payer: Self-pay | Admitting: Cardiology

## 2012-01-14 ENCOUNTER — Ambulatory Visit (INDEPENDENT_AMBULATORY_CARE_PROVIDER_SITE_OTHER): Payer: Medicaid Other | Admitting: Cardiology

## 2012-01-14 ENCOUNTER — Encounter (HOSPITAL_COMMUNITY): Payer: Self-pay

## 2012-01-14 VITALS — BP 106/76 | HR 75 | Ht 64.0 in | Wt 194.0 lb

## 2012-01-14 DIAGNOSIS — M129 Arthropathy, unspecified: Secondary | ICD-10-CM | POA: Insufficient documentation

## 2012-01-14 DIAGNOSIS — Z008 Encounter for other general examination: Secondary | ICD-10-CM | POA: Insufficient documentation

## 2012-01-14 DIAGNOSIS — I2510 Atherosclerotic heart disease of native coronary artery without angina pectoris: Secondary | ICD-10-CM | POA: Insufficient documentation

## 2012-01-14 DIAGNOSIS — F32A Depression, unspecified: Secondary | ICD-10-CM

## 2012-01-14 DIAGNOSIS — E785 Hyperlipidemia, unspecified: Secondary | ICD-10-CM | POA: Insufficient documentation

## 2012-01-14 DIAGNOSIS — Z87891 Personal history of nicotine dependence: Secondary | ICD-10-CM | POA: Insufficient documentation

## 2012-01-14 DIAGNOSIS — F329 Major depressive disorder, single episode, unspecified: Secondary | ICD-10-CM

## 2012-01-14 DIAGNOSIS — K219 Gastro-esophageal reflux disease without esophagitis: Secondary | ICD-10-CM | POA: Insufficient documentation

## 2012-01-14 DIAGNOSIS — E039 Hypothyroidism, unspecified: Secondary | ICD-10-CM | POA: Insufficient documentation

## 2012-01-14 DIAGNOSIS — F315 Bipolar disorder, current episode depressed, severe, with psychotic features: Secondary | ICD-10-CM | POA: Insufficient documentation

## 2012-01-14 DIAGNOSIS — E669 Obesity, unspecified: Secondary | ICD-10-CM | POA: Insufficient documentation

## 2012-01-14 HISTORY — DX: Major depressive disorder, single episode, unspecified: F32.9

## 2012-01-14 HISTORY — DX: Depression, unspecified: F32.A

## 2012-01-14 HISTORY — DX: Anxiety disorder, unspecified: F41.9

## 2012-01-14 LAB — POCT I-STAT, CHEM 8
Calcium, Ion: 1.17 mmol/L (ref 1.12–1.32)
Chloride: 106 mEq/L (ref 96–112)
Creatinine, Ser: 0.9 mg/dL (ref 0.50–1.10)
Glucose, Bld: 100 mg/dL — ABNORMAL HIGH (ref 70–99)
HCT: 39 % (ref 36.0–46.0)
Hemoglobin: 13.3 g/dL (ref 12.0–15.0)

## 2012-01-14 LAB — RAPID URINE DRUG SCREEN, HOSP PERFORMED: Opiates: POSITIVE — AB

## 2012-01-14 LAB — CBC WITH DIFFERENTIAL/PLATELET
Basophils Absolute: 0 10*3/uL (ref 0.0–0.1)
Basophils Relative: 0 % (ref 0–1)
Eosinophils Relative: 1 % (ref 0–5)
HCT: 38.6 % (ref 36.0–46.0)
MCHC: 33.7 g/dL (ref 30.0–36.0)
MCV: 94.6 fL (ref 78.0–100.0)
Monocytes Absolute: 0.8 10*3/uL (ref 0.1–1.0)
Neutro Abs: 6.3 10*3/uL (ref 1.7–7.7)
Platelets: 447 10*3/uL — ABNORMAL HIGH (ref 150–400)
RDW: 13 % (ref 11.5–15.5)

## 2012-01-14 LAB — ETHANOL: Alcohol, Ethyl (B): <11 mg/dL (ref 0–11)

## 2012-01-14 LAB — ACETAMINOPHEN LEVEL: Acetaminophen (Tylenol), Serum: <15 ug/mL (ref 10–30)

## 2012-01-14 LAB — SALICYLATE LEVEL: Salicylate Lvl: <2 mg/dL — ABNORMAL LOW (ref 2.8–20.0)

## 2012-01-14 MED ORDER — TICAGRELOR 90 MG PO TABS
90.0000 mg | ORAL_TABLET | Freq: Two times a day (BID) | ORAL | Status: DC
  Administered 2012-01-14 – 2012-01-15 (×2): 90 mg via ORAL
  Filled 2012-01-14 (×4): qty 1

## 2012-01-14 MED ORDER — IBUPROFEN 200 MG PO TABS
600.0000 mg | ORAL_TABLET | Freq: Three times a day (TID) | ORAL | Status: DC | PRN
  Administered 2012-01-14: 600 mg via ORAL
  Filled 2012-01-14: qty 3
  Filled 2012-01-14: qty 1
  Filled 2012-01-14: qty 2

## 2012-01-14 MED ORDER — LEVOTHYROXINE SODIUM 112 MCG PO TABS
112.0000 ug | ORAL_TABLET | Freq: Every day | ORAL | Status: DC
  Administered 2012-01-15: 112 ug via ORAL
  Filled 2012-01-14 (×2): qty 1

## 2012-01-14 MED ORDER — FLUOXETINE HCL 20 MG PO CAPS
40.0000 mg | ORAL_CAPSULE | Freq: Every day | ORAL | Status: DC
  Administered 2012-01-15: 40 mg via ORAL
  Filled 2012-01-14: qty 2

## 2012-01-14 MED ORDER — ZOLPIDEM TARTRATE 5 MG PO TABS
5.0000 mg | ORAL_TABLET | Freq: Every evening | ORAL | Status: DC | PRN
  Administered 2012-01-14: 5 mg via ORAL
  Filled 2012-01-14: qty 1

## 2012-01-14 MED ORDER — ONDANSETRON HCL 4 MG PO TABS: 4.0000 mg | ORAL_TABLET | Freq: Three times a day (TID) | ORAL | Status: DC | PRN

## 2012-01-14 MED ORDER — LORAZEPAM 1 MG PO TABS
1.0000 mg | ORAL_TABLET | Freq: Three times a day (TID) | ORAL | Status: DC | PRN
  Administered 2012-01-14 – 2012-01-15 (×3): 1 mg via ORAL
  Filled 2012-01-14 (×4): qty 1

## 2012-01-14 MED ORDER — FLUOXETINE HCL 40 MG PO CAPS: 40.0000 mg | ORAL_CAPSULE | Freq: Every day | ORAL | Status: DC

## 2012-01-14 MED ORDER — BUPROPION HCL ER (XL) 150 MG PO TB24
150.0000 mg | ORAL_TABLET | Freq: Every day | ORAL | Status: DC
  Administered 2012-01-15: 150 mg via ORAL
  Filled 2012-01-14: qty 1

## 2012-01-14 MED ORDER — ALUM & MAG HYDROXIDE-SIMETH 200-200-20 MG/5ML PO SUSP
30.0000 mL | ORAL | Status: DC | PRN
  Administered 2012-01-14: 30 mL via ORAL
  Filled 2012-01-14: qty 30

## 2012-01-14 MED ORDER — ACETAMINOPHEN 325 MG PO TABS: 650.0000 mg | ORAL_TABLET | ORAL | Status: DC | PRN

## 2012-01-14 MED ORDER — METOPROLOL SUCCINATE ER 25 MG PO TB24
25.0000 mg | ORAL_TABLET | Freq: Every day | ORAL | Status: DC
  Administered 2012-01-15: 25 mg via ORAL
  Filled 2012-01-14: qty 1

## 2012-01-14 MED ORDER — LISINOPRIL 5 MG PO TABS
5.0000 mg | ORAL_TABLET | Freq: Every day | ORAL | Status: DC
  Administered 2012-01-15: 5 mg via ORAL
  Filled 2012-01-14: qty 1

## 2012-01-14 MED ORDER — ASPIRIN EC 81 MG PO TBEC
81.0000 mg | DELAYED_RELEASE_TABLET | Freq: Every day | ORAL | Status: DC
  Administered 2012-01-15: 81 mg via ORAL
  Filled 2012-01-14: qty 1

## 2012-01-14 MED ORDER — PANTOPRAZOLE SODIUM 40 MG PO TBEC
40.0000 mg | DELAYED_RELEASE_TABLET | Freq: Every day | ORAL | Status: DC
  Administered 2012-01-14 – 2012-01-15 (×2): 40 mg via ORAL
  Filled 2012-01-14 (×2): qty 1

## 2012-01-14 MED ORDER — LORAZEPAM 1 MG PO TABS
1.0000 mg | ORAL_TABLET | Freq: Once | ORAL | Status: AC
  Administered 2012-01-14: 1 mg via ORAL

## 2012-01-14 MED ORDER — ATORVASTATIN CALCIUM 80 MG PO TABS
80.0000 mg | ORAL_TABLET | Freq: Every evening | ORAL | Status: DC
Start: 2012-01-14 — End: 2012-01-15
  Administered 2012-01-14: 80 mg via ORAL
  Filled 2012-01-14 (×2): qty 1

## 2012-01-14 NOTE — ED Notes (Signed)
AOZ:[id number]<VH> Expected date:01/13/12<BR> Expected time: 8:09 AM<BR> Means of arrival:<BR> Comments:<BR> Closed

## 2012-01-14 NOTE — ED Notes (Signed)
Pt c/o some anxiety and pain. MD informed. Will continue to monitor.

## 2012-01-14 NOTE — ED Notes (Signed)
Pt. Reports feeling anxious.  Dr. Juleen China ordered an extra dose of Ativan 1mg . For patient now.

## 2012-01-14 NOTE — ED Notes (Signed)
Pt placed on suicide precautions and she is aware of these precautions. All her personal belongings were sent home with her daughter. Pt denies any current or active thoughts of SI/HI. Sitter at bedside. Will continue to monitor.

## 2012-01-14 NOTE — Assessment & Plan Note (Signed)
No further ischemic-type chest pain.  BMS to LAD with NSTEMI in 6/13.  Continue ASA 81, Brilinta, atorvastatin, lisinopril, and Toprol XL.  Needs to do cardiac rehab.

## 2012-01-14 NOTE — ED Notes (Signed)
Patient reports that she began having anger toward others and herself. Patient also states that she is depressed because she has no car, lost her job, husband lost job, about to lose her house, and has no friends. Patient denies suicide attempt. Patient states she has suicidal ideations. Patient denies a plan. Patient states she takes more xanax than she should and walked out into traffic yesterday, but nothing happened. Patient states she hears voices and they are telling her, "You are no good."  Patient tearful at times.

## 2012-01-14 NOTE — Assessment & Plan Note (Signed)
Check lipids/LFTs in 8/13 with goal LDL < 70.

## 2012-01-14 NOTE — Patient Instructions (Addendum)
You have a follow-up appointment scheduled with Dr Shirlee Latch in 1 month. Friday August 2,2013 at 2:45pm.  Go to [facility] emergency room now  for further evaluation of your depression.

## 2012-01-14 NOTE — Assessment & Plan Note (Addendum)
Profound depression interspersed with panic attacks.  Has contemplated overdosing on her pills but no definite plan to act on this.  She is open to inpatient evaluation and feels like she may need this.  Her daughter will take her over to the ER at [facility].  We have talked to behavioral health and they will evaluate her there.   I spent > 40 minutes reviewing records and talking with patient today.

## 2012-01-14 NOTE — Progress Notes (Signed)
Patient ID: Erin Lynch, female   DOB: [birth year], 51 y.o.   MRN: [id number] PCP: Dr Ardelle Park  51 yo with history of CAD s/p recent NSTEMI in 6/13 presents to establish outpatient cardiology followup.  Patient presented with chest pain and was found to have a ruptured plaque in the proximal LAD.  She had BMS to LAD in 6/13.  EF was preserved on echo.  Since coming home, she has had some mild positional chest pain but no exertional chest pain.  She has quit smoking.  She gets short of breath only with "panic attacks."    Patient is tearful in the office today.  She says that she "doesn't have anything to live for."  She has contemplated suicide by overdosing on her pills.  She is profoundly depressed and has been much worse since her MI.  She and her husband are both out of work and she is afraid of losing everything.    ECG: NSR, nonspecific T wave inversions  Labs (6/13): K 4, creatinine 0.84, LDL 134, HDL 58  PMH: 1. CAD: NSTEMI 6/13 with LHC showing a hazy 50% proximal LAD stenosis.  IVUS showed plaque rupture thrombus.  She had a 4.0 x 18 Vision BMS. LV-gram showed EF 50-55% with apical anterior hypokinesis.  Echo after PCI showed EF 55%, grade II diastolic dysfunction, no regional WMAs.  2. Hyperlipidemia 3. Smoker 4. Fibromyalgia 5. Hypothyroidism 6. Impaired fasting glucose.  7. GERD 8. Depression: Has psychiatrist.   SH: Married, lives in [place].  2 children.  Unemployed Psychiatric nurse.  Quit smoking in 6/13 after MI.   FH: No premature CAD  ROS: All systems reviewed and negative except as per HPI.   Current Outpatient Prescriptions  Medication Sig Dispense Refill  . aspirin EC 81 MG EC tablet Take 1 tablet (81 mg total) by mouth daily.      Marland Kitchen atorvastatin (LIPITOR) 80 MG tablet Take 1 tablet (80 mg total) by mouth daily.  30 tablet  11  . buPROPion (WELLBUTRIN XL) 150 MG 24 hr tablet Take 150 mg by mouth daily.      Marland Kitchen esomeprazole (NEXIUM) 40 MG capsule Take 40 mg by mouth daily  before breakfast.      . FLUoxetine (PROZAC) 40 MG capsule Take 40 mg by mouth daily.      Marland Kitchen HYDROcodone-acetaminophen (LORTAB) 10-500 MG per tablet Take 1 tablet by mouth every 6 (six) hours as needed. For pain      . levothyroxine (SYNTHROID, LEVOTHROID) 112 MCG tablet Take 112 mcg by mouth daily.      Marland Kitchen lisinopril (PRINIVIL,ZESTRIL) 5 MG tablet Take 1 tablet (5 mg total) by mouth daily.  30 tablet  11  . metoprolol succinate (TOPROL XL) 25 MG 24 hr tablet Take 1 tablet (25 mg total) by mouth daily.  30 tablet  11  . nitroGLYCERIN (NITROSTAT) 0.4 MG SL tablet Place 1 tablet (0.4 mg total) under the tongue every 5 (five) minutes as needed for chest pain.  25 tablet  3  . Ticagrelor (BRILINTA) 90 MG TABS tablet Take 1 tablet (90 mg total) by mouth 2 (two) times daily.  60 tablet  11    BP 106/76  Pulse 75  Ht 5\' 4"  (1.626 m)  Wt 87.998 kg (194 lb)  BMI 33.30 kg/m2 General: Tearful Neck: No JVD, no thyromegaly or thyroid nodule.  Lungs: Clear to auscultation bilaterally with normal respiratory effort. CV: Nondisplaced PMI.  Heart regular S1/S2, no S3/S4, no murmur.  No peripheral edema.  No carotid bruit.  Normal pedal pulses.  Abdomen: Soft, nontender, no hepatosplenomegaly, no distention.  Skin: Intact without lesions or rashes.  Neurologic: Alert and oriented x 3.  Psych: Profoundly depressed Extremities: No clubbing or cyanosis.  HEENT: Normal.

## 2012-01-14 NOTE — ED Provider Notes (Cosign Needed)
History     CSN: [id number]  Arrival date & time 01/14/12  1035   First MD Initiated Contact with Patient 01/14/12 1036     10:59 AM HPI Recommended to come to ED by Dr. Shirlee Latch, Cards. According to office note patient was admitting to [facility] by overdose. Reports to me, severe depression. Reports possible hallucinations. Tells RN that she takes excessive xanax. Reports yesterday attempted to harm herself by walking out into traffic.   Patient is a 51 y.o. female presenting with mental health disorder. The history is provided by the patient.  Mental Health Problem The primary symptoms include dysphoric mood, delusions and hallucinations. Episode onset: 2 years. This is a chronic problem.  The mood has been worsening since its onset. She characterizes the problem as severe. The mood includes feelings of irritability, sadness and despair.  The degree of incapacity that she is experiencing as a consequence of her illness is moderate. Additional symptoms of the illness include agitation and poor judgment. She admits to suicidal ideas. She contemplates harming herself. She does not contemplate injuring another person. She has not already  injured another person.    Past Medical History  Diagnosis Date  . Fibromyalgia   . Bulging disc   . Arthritis   . Hypothyroid   . Hyperlipidemia LDL goal < 70   . Obesity (BMI 30.0-34.9)   . GERD (gastroesophageal reflux disease)   . NSTEMI, initial episode of care 01/02/2012  . Anxiety   . Depression     Past Surgical History  Procedure Date  . Cardiac catheterization 01/01/2012     WITH CORONARY ANGIOGRAM, left heart   . Coronary stent placement     No family history on file.  History  Substance Use Topics  . Smoking status: Former Smoker    Types: Cigarettes  . Smokeless tobacco: Not on file   Comment: stop june 2013  . Alcohol Use: No    OB History    Grav Para Term Preterm Abortions TAB SAB Ect Mult Living                  Review of  Systems  Psychiatric/Behavioral: Positive for suicidal ideas, hallucinations, dysphoric mood and agitation. Negative for self-injury. The patient is nervous/anxious.   All other systems reviewed and are negative.    Allergies  Review of patient's allergies indicates no known allergies.  Home Medications   Current Outpatient Rx  Name Route Sig Dispense Refill  . ASPIRIN 81 MG PO TBEC Oral Take 1 tablet (81 mg total) by mouth daily.    . ATORVASTATIN CALCIUM 80 MG PO TABS Oral Take 1 tablet (80 mg total) by mouth daily. 30 tablet 11  . BUPROPION HCL ER (XL) 150 MG PO TB24 Oral Take 150 mg by mouth daily.    Marland Kitchen ESOMEPRAZOLE MAGNESIUM 40 MG PO CPDR Oral Take 40 mg by mouth daily before breakfast.    . FLUOXETINE HCL 40 MG PO CAPS Oral Take 40 mg by mouth daily.    Marland Kitchen HYDROCODONE-ACETAMINOPHEN 10-500 MG PO TABS Oral Take 1 tablet by mouth every 6 (six) hours as needed. For pain    . LEVOTHYROXINE SODIUM 112 MCG PO TABS Oral Take 112 mcg by mouth daily.    Marland Kitchen LISINOPRIL 5 MG PO TABS Oral Take 1 tablet (5 mg total) by mouth daily. 30 tablet 11  . METOPROLOL SUCCINATE ER 25 MG PO TB24 Oral Take 1 tablet (25 mg total) by mouth daily. 30 tablet  11  . TICAGRELOR 90 MG PO TABS Oral Take 1 tablet (90 mg total) by mouth 2 (two) times daily. 60 tablet 11  . NITROGLYCERIN 0.4 MG SL SUBL Sublingual Place 1 tablet (0.4 mg total) under the tongue every 5 (five) minutes as needed for chest pain. 25 tablet 3    BP 132/91  Pulse 69  Temp 97.9 F (36.6 C) (Oral)  Resp 22  Ht 5\' 4"  (1.626 m)  Wt 190 lb (86.183 kg)  BMI 32.61 kg/m2  SpO2 98%  Physical Exam  Vitals reviewed. Constitutional: She is oriented to person, place, and time. Vital signs are normal. She appears well-developed and well-nourished. No distress.  HENT:  Head: Normocephalic and atraumatic.  Eyes: Pupils are equal, round, and reactive to light.  Neck: Neck supple.  Pulmonary/Chest: Effort normal.  Neurological: She is alert and  oriented to person, place, and time.  Skin: Skin is warm and dry. No rash noted. No erythema. No pallor.  Psychiatric: Her affect is angry. She is agitated and aggressive.    ED Course  Procedures  Results for orders placed during the hospital encounter of 01/14/12  CBC WITH DIFFERENTIAL      Component Value Range   WBC 9.7  4.0 - 10.5 K/uL   RBC 4.08  3.87 - 5.11 MIL/uL   Hemoglobin 13.0  12.0 - 15.0 g/dL   HCT 16.1  09.6 - 04.5 %   MCV 94.6  78.0 - 100.0 fL   MCH 31.9  26.0 - 34.0 pg   MCHC 33.7  30.0 - 36.0 g/dL   RDW 40.9  81.1 - 91.4 %   Platelets 447 (*) 150 - 400 K/uL   Neutrophils Relative 64  43 - 77 %   Neutro Abs 6.3  1.7 - 7.7 K/uL   Lymphocytes Relative 26  12 - 46 %   Lymphs Abs 2.5  0.7 - 4.0 K/uL   Monocytes Relative 9  3 - 12 %   Monocytes Absolute 0.8  0.1 - 1.0 K/uL   Eosinophils Relative 1  0 - 5 %   Eosinophils Absolute 0.1  0.0 - 0.7 K/uL   Basophils Relative 0  0 - 1 %   Basophils Absolute 0.0  0.0 - 0.1 K/uL  URINE RAPID DRUG SCREEN (HOSP PERFORMED)      Component Value Range   Opiates POSITIVE (*) NONE DETECTED   Cocaine NONE DETECTED  NONE DETECTED   Benzodiazepines POSITIVE (*) NONE DETECTED   Amphetamines POSITIVE (*) NONE DETECTED   Tetrahydrocannabinol NONE DETECTED  NONE DETECTED   Barbiturates NONE DETECTED  NONE DETECTED  ETHANOL      Component Value Range   Alcohol, Ethyl (B) <11  0 - 11 mg/dL  ACETAMINOPHEN LEVEL      Component Value Range   Acetaminophen (Tylenol), Serum <15.0  10 - 30 ug/mL  SALICYLATE LEVEL      Component Value Range   Salicylate Lvl <2.0 (*) 2.8 - 20.0 mg/dL  POCT I-STAT, CHEM 8      Component Value Range   Sodium 140  135 - 145 mEq/L   Potassium 4.4  3.5 - 5.1 mEq/L   Chloride 106  96 - 112 mEq/L   BUN 15  6 - 23 mg/dL   Creatinine, Ser 7.82  0.50 - 1.10 mg/dL   Glucose, Bld 956 (*) 70 - 99 mg/dL   Calcium, Ion 2.13  0.86 - 1.32 mmol/L   TCO2 21  0 -  100 mmol/L   Hemoglobin 13.3  12.0 - 15.0 g/dL   HCT  47.8  29.5 - 62.1 %    MDM  12:35 PM Spoke with [place], ACT, who will see the patient for evaluation of depression and SI        Thomasene Lot, PA-C 01/14/12 1513

## 2012-01-14 NOTE — ED Notes (Signed)
Patient placed in paper scrubs and checked by security. Patient belongings sent home with family

## 2012-01-15 NOTE — BHH Counselor (Addendum)
Completed paperwork & faxed to [facility] to request telepsych. Pending psychiatrist to call back. TC to RN to let her know this was scheduled & was informed that pt had telepsych approx 30 min prior. Awaiting report. Called back to [facility] to cancel the 2nd telepsych.

## 2012-01-15 NOTE — ED Notes (Signed)
Specialist on call called and awaiting call back from MD>

## 2012-01-15 NOTE — ED Provider Notes (Addendum)
Patient was seen and examined this morning. She states she is feeling better and is no longer thinking about harming herself. The plan is for a psychiatry consult this morning.  Patient would like to go home we will reassess after the consult is been completed.  Celene Kras, MD 01/15/12 0740  Pt was assessed by Dr. Leretha Pol, psychiatry.  Pt does not require admission at this time.  Pt will continue her current therapy and follow up with her psychiatrist.  Celene Kras, MD 01/15/12 1006

## 2012-01-15 NOTE — BH Assessment (Signed)
Assessment Note   Erin Lynch is an 51 y.o. female. Patient report feelings of sadness and hopelessness due fights with her husband. Patient denied any domestic violence. Patient reports feelings anxious and overwhelmed since she had heart surgery. Patient reports a history of mental health illness as well as physical conditions that cause her to be in constant pain. Patient reported SI when she came to the ER. Patient now denies having any SI/HI. Patient denies any psychosis. Patient denies any current substance abuse. 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 Patient reports that her medication that she receives from Dr. Ardelle Park is not helping her control her feelings of anxiety and depression. Patient reports that she is able to contract for safety and wants to be discharged. Therapist consulted with physician and the patient will have a Tele Psych   Telepsych completed & recommended d/c with pt to follow up with current psychiatrist ASAP for medication re-evaluation. Updated EDP who was agreeable.  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 Fibromyalgia   . Bulging disc   . Arthritis   . Hypothyroid   . Hyperlipidemia LDL goal < 70   . Obesity (BMI 30.0-34.9)   . GERD (gastroesophageal reflux disease)   . NSTEMI, initial episode of care 01/02/2012  . Anxiety   . Depression    Axis IV: other psychosocial or environmental problems and problems with primary support group Axis V: 41-50 serious symptoms  Past Medical History:  Past Medical History  Diagnosis Date  . Fibromyalgia   . Bulging disc   . Arthritis   . Hypothyroid   . Hyperlipidemia LDL goal < 70   . Obesity (BMI 30.0-34.9)   . GERD (gastroesophageal reflux disease)   . NSTEMI, initial episode of care 01/02/2012  . Anxiety   . Depression     Past Surgical History  Procedure Date  . Cardiac catheterization 01/01/2012     WITH  CORONARY ANGIOGRAM, left heart   . Coronary stent placement     Family History: No family history on file.  Social History:  reports that she has quit smoking. Her smoking use included Cigarettes. She does not have any smokeless tobacco history on file. She reports that she does not drink alcohol or use illicit drugs.  Additional Social History:     CIWA: CIWA-Ar BP: 110/62 mmHg Pulse Rate: 82  COWS:    Allergies: No Known Allergies  Home Medications:  (Not in a hospital admission)  OB/GYN Status:  No LMP recorded. Patient is postmenopausal.  General Assessment Data Location of Assessment: [facility] ACT Assessment: Yes Living Arrangements: Spouse/significant other Can pt return to current living arrangement?: Yes Admission Status: Voluntary Is patient capable of signing voluntary admission?: Yes Transfer from: Acute Hospital Referral Source: MD     Risk to self Suicidal Ideation: No Suicidal Intent: No Is patient at risk for suicide?: No Suicidal Plan?: No Access to Means: No What has been your use of drugs/alcohol within the last 12 months?: na Previous Attempts/Gestures: No How many times?: 0  Other Self Harm Risks: na Triggers for Past Attempts: Spouse contact;Unpredictable Intentional Self Injurious Behavior: None Family Suicide History: No Recent stressful life event(s): Trauma (Comment);Other (Comment) Persecutory voices/beliefs?: No Depression: Yes Depression Symptoms: Insomnia;Tearfulness;Loss of interest in usual pleasures;Guilt;Isolating;Fatigue Substance abuse history and/or treatment for substance abuse?: Yes Suicide prevention information given to non-admitted patients: Not applicable  Risk  to Others Homicidal Ideation: No Thoughts of Harm to Others: No Current Homicidal Intent: No Current Homicidal Plan: No Access to Homicidal Means: No Identified Victim: na History of harm to others?: No Assessment of Violence: None Noted Violent Behavior  Description: none reported Does patient have access to weapons?: No Criminal Charges Pending?: No Does patient have a court date: No  Psychosis Hallucinations: None noted Delusions: None noted  Mental Status Report Appear/Hygiene: Disheveled Eye Contact: Poor Motor Activity: Agitation;Restlessness Speech: Logical/coherent Level of Consciousness: Alert;Quiet/awake Mood: Preoccupied Affect: Appropriate to circumstance Anxiety Level: Minimal Thought Processes: Coherent;Relevant Judgement: Unimpaired Orientation: Place;Person;Time;Situation Obsessive Compulsive Thoughts/Behaviors: None  Cognitive Functioning Concentration: Decreased Memory: Recent Intact;Remote Intact IQ: Average Insight: Fair Impulse Control: Poor Appetite: Fair Weight Loss: 0  Weight Gain: 0  Sleep: Decreased Total Hours of Sleep: 7  Vegetative Symptoms: None  ADLScreening [facility] Assessment Services) Patient's cognitive ability adequate to safely complete daily activities?: Yes Patient able to express need for assistance with ADLs?: Yes Independently performs ADLs?: Yes  Abuse/Neglect [facility]) Physical Abuse: Denies Verbal Abuse: Denies Sexual Abuse: Denies  Prior Inpatient Therapy Prior Inpatient Therapy: Yes Prior Therapy Dates: unk Prior [facility]Provider([facility] Reason for Treatment: depression   Prior Outpatient Therapy Prior Outpatient Therapy: Yes Prior Therapy Dates: 2012 [facility]Provider([facility]): [facility] Reason for Treatment: depression and anxiety  ADL Screening (condition at time of admission) Patient's cognitive ability adequate to safely complete daily activities?: Yes Patient able to express need for assistance with ADLs?: Yes Independently performs ADLs?: Yes       Abuse/Neglect Assessment (Assessment to be complete while patient is alone) Physical Abuse: Denies Verbal Abuse: Denies Sexual Abuse: Denies Values / Beliefs Cultural Requests During  Hospitalization: None Spiritual Requests During Hospitalization: None     Nutrition Screen Diet: Regular  Additional Information 1:1 In Past 12 Months?: No CIRT Risk: No Elopement Risk: No Does patient have medical clearance?: Yes     Disposition:  Disposition Disposition of Patient: Referred to (Psychiatrist recommended d/c to current provider) Patient referred to: Other (Comment) (Psychiatrist recommended d/c to current provider)  On Site Evaluation by:   Reviewed with Physician:     Romeo Apple 01/15/2012 10:42 AM

## 2012-01-15 NOTE — BHH Counselor (Signed)
Telespych completed report recommended the following: "Pt does not require psych admission at this time. Pt needs to be followed by her psychiatrist ASAP and maybe have her meds adjusted. This was discussed with patient. Pt should continue with therapy." No changes in medications were recommended.

## 2012-01-15 NOTE — ED Notes (Signed)
md allen in room with pt and stated to perform telepsych

## 2012-01-15 NOTE — BH Assessment (Addendum)
Assessment Note   Erin Lynch is an 51 y.o. female.   Patient report feelings of sadness and hopelessness due fights with her husband.  Patient denied any domestic violence. Patient reports feelings anxious and overwhelmed since she had heart surgery.  Patient reports a history of mental health illness as well as physical conditions that cause her to be in constant pain.  Patient reported SI when she came to the ER.  Patient now denies having any SI/HI.  Patient denies any psychosis.  Patient denies any current substance abuse.  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  Patient reports that her medication that she receives from Dr. Ardelle Park is not helping her control her feelings of anxiety and depression.  Patient reports that she is able to contract for safety and wants to be discharged. Therapist consulted with physician and the patient will have a Tele Psych   Axis I: Major Depression, Recurrent severe Axis II: Deferred Axis III:  Past Medical History  Diagnosis Date  . Fibromyalgia   . Bulging disc   . Arthritis   . Hypothyroid   . Hyperlipidemia LDL goal < 70   . Obesity (BMI 30.0-34.9)   . GERD (gastroesophageal reflux disease)   . NSTEMI, initial episode of care 01/02/2012  . Anxiety   . Depression    Axis IV: economic problems, occupational problems, other psychosocial or environmental problems, problems related to social environment and problems with primary support group Axis V: 51-60 moderate symptoms  Past Medical History:  Past Medical History  Diagnosis Date  . Fibromyalgia   . Bulging disc   . Arthritis   . Hypothyroid   . Hyperlipidemia LDL goal < 70   . Obesity (BMI 30.0-34.9)   . GERD (gastroesophageal reflux disease)   . NSTEMI, initial episode of care 01/02/2012  . Anxiety   . Depression     Past Surgical History  Procedure Date  . Cardiac catheterization 01/01/2012     WITH CORONARY ANGIOGRAM, left heart   . Coronary stent placement      Family History: No family history on file.  Social History:  reports that she has quit smoking. Her smoking use included Cigarettes. She does not have any smokeless tobacco history on file. She reports that she does not drink alcohol or use illicit drugs.  Additional Social History:     CIWA: CIWA-Ar BP: 95/66 mmHg Pulse Rate: 74  COWS:    Allergies: No Known Allergies  Home Medications:  (Not in a hospital admission)  OB/GYN Status:  No LMP recorded. Patient is postmenopausal.  General Assessment Data [facility] of [facility]: [facility] ACT Assessment: Yes Living Arrangements: Spouse/significant other Can pt return to current living arrangement?: Yes Admission Status: Voluntary Is patient capable of signing voluntary admission?: Yes Transfer from: Home Referral Source: Other     Risk to self Suicidal Ideation: No Suicidal Intent: No Is patient at risk for suicide?: No Suicidal Plan?: No Access to Means: No What has been your use of drugs/alcohol within the last 12 months?: na Previous Attempts/Gestures: No How many times?: 0  Other Self Harm Risks: na Triggers for Past Attempts: Spouse contact;Unpredictable Intentional Self Injurious Behavior: None Family Suicide History: No Recent stressful life event(s): Trauma (Comment);Other (Comment) Persecutory voices/beliefs?: No Depression: Yes Depression Symptoms: Insomnia;Tearfulness;Loss of interest in usual pleasures;Guilt;Isolating;Fatigue Substance abuse history and/or treatment for substance abuse?: Yes Suicide prevention information given to non-admitted patients: Not applicable  Risk to Others Homicidal Ideation: No  Thoughts of Harm to Others: No Current Homicidal Intent: No Current Homicidal Plan: No Access to Homicidal Means: No Identified Victim: na History of harm to others?: No Assessment of Violence: None Noted Violent Behavior Description: none reported Does patient have access to weapons?:  No Criminal Charges Pending?: No Does patient have a court date: No  Psychosis Hallucinations: None noted Delusions: None noted  Mental Status Report Appear/Hygiene: Disheveled Eye Contact: Poor Motor Activity: Agitation;Restlessness Speech: Logical/coherent Level of Consciousness: Alert;Quiet/awake Mood: Preoccupied Affect: Appropriate to circumstance Anxiety Level: Minimal Thought Processes: Coherent;Relevant Judgement: Unimpaired Orientation: Place;Person;Time;Situation Obsessive Compulsive Thoughts/Behaviors: None  Cognitive Functioning Concentration: Decreased Memory: Recent Intact;Remote Intact IQ: Average Insight: Fair Impulse Control: Poor Appetite: Fair Weight Loss: 0  Weight Gain: 0  Sleep: Decreased Total Hours of Sleep: 7  Vegetative Symptoms: None  ADLScreening [facility] Assessment Services) Patient's cognitive ability adequate to safely complete daily activities?: Yes Patient able to express need for assistance with ADLs?: Yes Independently performs ADLs?: Yes  Abuse/Neglect [facility]) Physical Abuse: Denies Verbal Abuse: Denies Sexual Abuse: Denies  Prior Inpatient Therapy Prior Inpatient Therapy: Yes Prior Therapy Dates: unk [facility]Provider([facility] Reason for Treatment: depression   Prior Outpatient Therapy Prior Outpatient Therapy: Yes Prior Therapy Dates: 2012 [facility]): [facility] Reason for Treatment: depression and anxiety  ADL Screening (condition at time of admission) Patient's cognitive ability adequate to safely complete daily activities?: Yes Patient able to express need for assistance with ADLs?: Yes Independently performs ADLs?: Yes       Abuse/Neglect Assessment (Assessment to be complete while patient is alone) Physical Abuse: Denies Verbal Abuse: Denies Sexual Abuse: Denies Values / Beliefs Cultural Requests During Hospitalization: None Spiritual Requests During Hospitalization: None         Additional Information 1:1 In Past 12 Months?: No CIRT Risk: No Elopement Risk: No Does patient have medical clearance?: Yes     Disposition: Patient pendingTelepsych recommendations.  aDisposition Disposition of Patient: Referred to Patient referred to: Outpatient clinic referral  On Site Evaluation by:   Reviewed with Physician:     Phillip Heal LaVerne 01/15/2012 6:37 AM

## 2012-02-05 ENCOUNTER — Telehealth: Payer: Self-pay | Admitting: *Deleted

## 2012-02-05 ENCOUNTER — Telehealth: Payer: Self-pay | Admitting: Cardiology

## 2012-02-05 MED ORDER — PRASUGREL HCL 10 MG PO TABS: 10.0000 mg | ORAL_TABLET | Freq: Every day | ORAL | Status: DC

## 2012-02-05 MED ORDER — CLOPIDOGREL BISULFATE 75 MG PO TABS: 75.0000 mg | ORAL_TABLET | Freq: Every day | ORAL | Status: DC

## 2012-02-05 NOTE — Telephone Encounter (Signed)
Laurey Morale, MD More Detail >>      Laurey Morale, MD        Sent: Wed February 05, 2012  4:40 PM    To: Jacqlyn Krauss, RN        Gennette Shadix    MRN: [id number] DOB: January 10, 1961     Pt Home: [phone]               Message     Change Nexium to Protonix or Zantac (ideally Zantac).     ----- Message -----       From: Jacqlyn Krauss, RN       Sent: 02/05/2012   2:48 PM         To: Laurey Morale, MD         Her insurance would not approve Brilinta or Effient. I sent in Plavix 75mg  daily. She is also on Nexium. Is that OK? Thurston Hole

## 2012-02-05 NOTE — Telephone Encounter (Signed)
Effient requires prior approval per pharmacist at [facility]. Reviewed again with Dr Shirlee Latch. He will prescribe plavix 75mg  daily instead of Brilinta or Effient.

## 2012-02-05 NOTE — Telephone Encounter (Signed)
Reviewed with Dr Shirlee Latch. He recommended Effient 10mg  daily instead of Brilinta.

## 2012-02-05 NOTE — Telephone Encounter (Signed)
PT CALLING RE MED, BRILANTA, TOO EXPENSIVE, NEEDS TO CHANGE TOO SOMETHING CHEAPER THAT MEDICAID WILL COVER, PLS CALL [phone], USES [facility]

## 2012-02-14 ENCOUNTER — Ambulatory Visit: Payer: Medicaid Other | Admitting: Cardiology

## 2012-03-05 ENCOUNTER — Ambulatory Visit: Payer: Medicaid Other | Admitting: Cardiology

## 2012-08-24 ENCOUNTER — Telehealth: Payer: Self-pay | Admitting: Cardiology

## 2012-08-24 NOTE — Telephone Encounter (Signed)
Release mailed to Pt Home Address

## 2012-09-20 ENCOUNTER — Other Ambulatory Visit: Payer: Self-pay | Admitting: Cardiology

## 2013-05-04 ENCOUNTER — Other Ambulatory Visit: Payer: Self-pay | Admitting: Cardiology

## 2013-05-05 NOTE — Telephone Encounter (Signed)
Kim can you help with this Thanks

## 2013-05-05 NOTE — Telephone Encounter (Signed)
Called patient for appointment so we can refill her medications, left message on machine to call for appointment.

## 2014-06-23 ENCOUNTER — Encounter (HOSPITAL_COMMUNITY): Payer: Self-pay | Admitting: Cardiovascular Disease

## 2015-10-10 ENCOUNTER — Encounter (HOSPITAL_COMMUNITY): Payer: Self-pay | Admitting: *Deleted

## 2015-10-10 ENCOUNTER — Emergency Department (HOSPITAL_COMMUNITY): Payer: Medicare Other

## 2015-10-10 ENCOUNTER — Inpatient Hospital Stay (HOSPITAL_COMMUNITY)
Admission: EM | Admit: 2015-10-10 | Discharge: 2015-10-11 | DRG: 287 | Disposition: A | Discharge status: Home / Self Care | Payer: Medicare Other | Attending: Cardiovascular Disease | Admitting: Cardiovascular Disease

## 2015-10-10 DIAGNOSIS — E039 Hypothyroidism, unspecified: Secondary | ICD-10-CM | POA: Diagnosis present

## 2015-10-10 DIAGNOSIS — Z955 Presence of coronary angioplasty implant and graft: Secondary | ICD-10-CM

## 2015-10-10 DIAGNOSIS — K219 Gastro-esophageal reflux disease without esophagitis: Secondary | ICD-10-CM | POA: Diagnosis present

## 2015-10-10 DIAGNOSIS — E785 Hyperlipidemia, unspecified: Secondary | ICD-10-CM | POA: Diagnosis present

## 2015-10-10 DIAGNOSIS — I25110 Atherosclerotic heart disease of native coronary artery with unstable angina pectoris: Principal | ICD-10-CM

## 2015-10-10 DIAGNOSIS — I5032 Chronic diastolic (congestive) heart failure: Secondary | ICD-10-CM

## 2015-10-10 DIAGNOSIS — I110 Hypertensive heart disease with heart failure: Secondary | ICD-10-CM | POA: Diagnosis present

## 2015-10-10 DIAGNOSIS — I252 Old myocardial infarction: Secondary | ICD-10-CM

## 2015-10-10 DIAGNOSIS — I10 Essential (primary) hypertension: Secondary | ICD-10-CM

## 2015-10-10 DIAGNOSIS — E669 Obesity, unspecified: Secondary | ICD-10-CM | POA: Diagnosis present

## 2015-10-10 DIAGNOSIS — I200 Unstable angina: Secondary | ICD-10-CM

## 2015-10-10 DIAGNOSIS — Z79899 Other long term (current) drug therapy: Secondary | ICD-10-CM

## 2015-10-10 DIAGNOSIS — F17210 Nicotine dependence, cigarettes, uncomplicated: Secondary | ICD-10-CM | POA: Diagnosis present

## 2015-10-10 DIAGNOSIS — F419 Anxiety disorder, unspecified: Secondary | ICD-10-CM | POA: Diagnosis present

## 2015-10-10 DIAGNOSIS — Z6830 Body mass index (BMI) 30.0-30.9, adult: Secondary | ICD-10-CM

## 2015-10-10 DIAGNOSIS — M797 Fibromyalgia: Secondary | ICD-10-CM | POA: Diagnosis present

## 2015-10-10 DIAGNOSIS — I2510 Atherosclerotic heart disease of native coronary artery without angina pectoris: Secondary | ICD-10-CM | POA: Diagnosis present

## 2015-10-10 DIAGNOSIS — Z7902 Long term (current) use of antithrombotics/antiplatelets: Secondary | ICD-10-CM

## 2015-10-10 HISTORY — DX: Essential (primary) hypertension: I10

## 2015-10-10 HISTORY — DX: Chronic diastolic (congestive) heart failure: I50.32

## 2015-10-10 HISTORY — DX: Atherosclerotic heart disease of native coronary artery without angina pectoris: I25.10

## 2015-10-10 LAB — I-STAT TROPONIN, ED: Troponin i, poc: 0 ng/mL (ref 0.00–0.08)

## 2015-10-10 LAB — BASIC METABOLIC PANEL
Anion gap: 8 (ref 5–15)
BUN: 11 mg/dL (ref 6–20)
CALCIUM: 8.7 mg/dL — AB (ref 8.9–10.3)
CHLORIDE: 105 mmol/L (ref 101–111)
CO2: 27 mmol/L (ref 22–32)
CREATININE: 0.8 mg/dL (ref 0.44–1.00)
GFR calc Af Amer: >60 mL/min (ref >60)
Glucose, Bld: 82 mg/dL (ref 65–99)
Potassium: 4.6 mmol/L (ref 3.5–5.1)
SODIUM: 140 mmol/L (ref 135–145)

## 2015-10-10 LAB — CBC
HCT: 34.5 % — ABNORMAL LOW (ref 36.0–46.0)
Hemoglobin: 11.2 g/dL — ABNORMAL LOW (ref 12.0–15.0)
MCH: 31.1 pg (ref 26.0–34.0)
MCHC: 32.5 g/dL (ref 30.0–36.0)
MCV: 95.8 fL (ref 78.0–100.0)
PLATELETS: 226 10*3/uL (ref 150–400)
RBC: 3.6 MIL/uL — ABNORMAL LOW (ref 3.87–5.11)
RDW: 13.7 % (ref 11.5–15.5)
WBC: 9.8 10*3/uL (ref 4.0–10.5)

## 2015-10-10 LAB — HEPATIC FUNCTION PANEL
ALT: 23 U/L (ref 14–54)
AST: 25 U/L (ref 15–41)
Albumin: 3.2 g/dL — ABNORMAL LOW (ref 3.5–5.0)
Alkaline Phosphatase: 96 U/L (ref 38–126)
BILIRUBIN DIRECT: 0.2 mg/dL (ref 0.1–0.5)
BILIRUBIN INDIRECT: 0.2 mg/dL — AB (ref 0.3–0.9)
Total Bilirubin: 0.4 mg/dL (ref 0.3–1.2)
Total Protein: 5.9 g/dL — ABNORMAL LOW (ref 6.5–8.1)

## 2015-10-10 LAB — BRAIN NATRIURETIC PEPTIDE: B NATRIURETIC PEPTIDE 5: 25 pg/mL (ref 0.0–100.0)

## 2015-10-10 LAB — TROPONIN I: Troponin I: <0.03 ng/mL (ref <0.031)

## 2015-10-10 LAB — LIPASE, BLOOD: Lipase: 19 U/L (ref 11–51)

## 2015-10-10 LAB — HEPARIN LEVEL (UNFRACTIONATED): Heparin Unfractionated: 0.27 IU/mL — ABNORMAL LOW (ref 0.30–0.70)

## 2015-10-10 MED ORDER — MORPHINE SULFATE (PF) 2 MG/ML IV SOLN
2.0000 mg | Freq: Once | INTRAVENOUS | Status: AC
  Administered 2015-10-10: 2 mg via INTRAVENOUS
  Filled 2015-10-10: qty 1

## 2015-10-10 MED ORDER — ALPRAZOLAM 0.5 MG PO TABS
0.5000 mg | ORAL_TABLET | Freq: Two times a day (BID) | ORAL | Status: DC | PRN
  Administered 2015-10-10: 0.5 mg via ORAL
  Filled 2015-10-10: qty 1

## 2015-10-10 MED ORDER — BUPROPION HCL ER (XL) 150 MG PO TB24
150.0000 mg | ORAL_TABLET | Freq: Every day | ORAL | Status: DC
  Administered 2015-10-10 – 2015-10-11 (×2): 150 mg via ORAL
  Filled 2015-10-10 (×2): qty 1

## 2015-10-10 MED ORDER — ATORVASTATIN CALCIUM 80 MG PO TABS
80.0000 mg | ORAL_TABLET | Freq: Every day | ORAL | Status: DC
  Administered 2015-10-10 – 2015-10-11 (×2): 80 mg via ORAL
  Filled 2015-10-10 (×2): qty 1

## 2015-10-10 MED ORDER — HEPARIN (PORCINE) IN NACL 100-0.45 UNIT/ML-% IJ SOLN
1150.0000 [IU]/h | INTRAMUSCULAR | Status: DC
  Administered 2015-10-10: 1000 [IU]/h via INTRAVENOUS
  Filled 2015-10-10: qty 250

## 2015-10-10 MED ORDER — SODIUM CHLORIDE 0.9 % IV BOLUS (SEPSIS)
1000.0000 mL | Freq: Once | INTRAVENOUS | Status: AC
  Administered 2015-10-10: 1000 mL via INTRAVENOUS

## 2015-10-10 MED ORDER — ALPRAZOLAM 0.5 MG PO TABS
0.5000 mg | ORAL_TABLET | Freq: Two times a day (BID) | ORAL | Status: AC | PRN
  Administered 2015-10-10: 0.5 mg via ORAL
  Filled 2015-10-10: qty 1

## 2015-10-10 MED ORDER — FLUOXETINE HCL 40 MG PO CAPS: 40.0000 mg | ORAL_CAPSULE | Freq: Every day | ORAL | Status: DC

## 2015-10-10 MED ORDER — ZOLPIDEM TARTRATE 5 MG PO TABS
5.0000 mg | ORAL_TABLET | Freq: Every evening | ORAL | Status: DC | PRN
  Administered 2015-10-11: 5 mg via ORAL
  Filled 2015-10-10: qty 1

## 2015-10-10 MED ORDER — NITROGLYCERIN 0.4 MG SL SUBL: 0.4000 mg | SUBLINGUAL_TABLET | SUBLINGUAL | Status: DC | PRN

## 2015-10-10 MED ORDER — ONDANSETRON HCL 4 MG/2ML IJ SOLN: 4.0000 mg | Freq: Four times a day (QID) | INTRAMUSCULAR | Status: DC | PRN

## 2015-10-10 MED ORDER — METOPROLOL SUCCINATE ER 25 MG PO TB24
25.0000 mg | ORAL_TABLET | Freq: Every day | ORAL | Status: DC
  Administered 2015-10-11: 25 mg via ORAL
  Filled 2015-10-10: qty 1

## 2015-10-10 MED ORDER — ACETAMINOPHEN 325 MG PO TABS
650.0000 mg | ORAL_TABLET | ORAL | Status: DC | PRN
  Administered 2015-10-11: 650 mg via ORAL
  Filled 2015-10-10: qty 2

## 2015-10-10 MED ORDER — SODIUM CHLORIDE 0.9% FLUSH: 3.0000 mL | INTRAVENOUS | Status: DC | PRN

## 2015-10-10 MED ORDER — ASPIRIN 81 MG PO CHEW
81.0000 mg | CHEWABLE_TABLET | ORAL | Status: AC
  Administered 2015-10-11: 81 mg via ORAL
  Filled 2015-10-10: qty 1

## 2015-10-10 MED ORDER — SODIUM CHLORIDE 0.9 % WEIGHT BASED INFUSION
3.0000 mL/kg/h | INTRAVENOUS | Status: AC
  Administered 2015-10-11: 3 mL/kg/h via INTRAVENOUS

## 2015-10-10 MED ORDER — ASPIRIN EC 81 MG PO TBEC
81.0000 mg | DELAYED_RELEASE_TABLET | Freq: Every day | ORAL | Status: DC
  Filled 2015-10-10: qty 1

## 2015-10-10 MED ORDER — HEPARIN BOLUS VIA INFUSION
4000.0000 [IU] | Freq: Once | INTRAVENOUS | Status: AC
  Administered 2015-10-10: 4000 [IU] via INTRAVENOUS
  Filled 2015-10-10: qty 4000

## 2015-10-10 MED ORDER — CLOPIDOGREL BISULFATE 75 MG PO TABS
75.0000 mg | ORAL_TABLET | Freq: Every day | ORAL | Status: DC
  Administered 2015-10-10 – 2015-10-11 (×2): 75 mg via ORAL
  Filled 2015-10-10 (×2): qty 1

## 2015-10-10 MED ORDER — NITROGLYCERIN 0.4 MG SL SUBL
0.4000 mg | SUBLINGUAL_TABLET | SUBLINGUAL | Status: DC | PRN
  Administered 2015-10-10 (×2): 0.4 mg via SUBLINGUAL
  Filled 2015-10-10: qty 1

## 2015-10-10 MED ORDER — FLUOXETINE HCL 20 MG PO CAPS
40.0000 mg | ORAL_CAPSULE | Freq: Every day | ORAL | Status: DC
  Administered 2015-10-10: 40 mg via ORAL
  Filled 2015-10-10 (×2): qty 2

## 2015-10-10 MED ORDER — SODIUM CHLORIDE 0.9% FLUSH
3.0000 mL | Freq: Two times a day (BID) | INTRAVENOUS | Status: DC
Start: 2015-10-10 — End: 2015-10-11

## 2015-10-10 MED ORDER — SODIUM CHLORIDE 0.9 % WEIGHT BASED INFUSION
1.0000 mL/kg/h | INTRAVENOUS | Status: DC
  Administered 2015-10-11: 1 mL/kg/h via INTRAVENOUS

## 2015-10-10 MED ORDER — FAMOTIDINE 20 MG PO TABS
20.0000 mg | ORAL_TABLET | Freq: Every day | ORAL | Status: DC
  Administered 2015-10-10 – 2015-10-11 (×2): 20 mg via ORAL
  Filled 2015-10-10 (×2): qty 1

## 2015-10-10 MED ORDER — PANTOPRAZOLE SODIUM 40 MG IV SOLR
40.0000 mg | Freq: Once | INTRAVENOUS | Status: AC
  Administered 2015-10-10: 40 mg via INTRAVENOUS
  Filled 2015-10-10: qty 40

## 2015-10-10 MED ORDER — LISINOPRIL 5 MG PO TABS
5.0000 mg | ORAL_TABLET | Freq: Every day | ORAL | Status: DC
  Administered 2015-10-11: 5 mg via ORAL
  Filled 2015-10-10: qty 1

## 2015-10-10 MED ORDER — LEVOTHYROXINE SODIUM 112 MCG PO TABS
112.0000 ug | ORAL_TABLET | Freq: Every day | ORAL | Status: DC
  Administered 2015-10-11: 112 ug via ORAL
  Filled 2015-10-10: qty 1

## 2015-10-10 MED ORDER — SODIUM CHLORIDE 0.9 % IV SOLN: 250.0000 mL | INTRAVENOUS | Status: DC | PRN

## 2015-10-10 NOTE — ED Notes (Signed)
Cardiology at bedside.

## 2015-10-10 NOTE — ED Notes (Signed)
Pt here per [facility] with complaint of episodes of chest pain described as pressure that goes up into neck.  Pt had CP eariler this am which she took NTG SL x 2 with relief of CP.  Pt also reports taking 324 mg ASA PTA.  [facility] started IV and performed EKG.  Pt currently pain free.

## 2015-10-10 NOTE — ED Notes (Signed)
Pt currently chest pain free.

## 2015-10-10 NOTE — Progress Notes (Signed)
ANTICOAGULATION CONSULT NOTE - Initial Consult  Pharmacy Consult for heparin Indication: chest pain/ACS  No Known Allergies  Patient Measurements: TBW ~85 kg  Vital Signs: Temp: 98.4 F (36.9 C) (03/28 0949) Temp Source: Oral (03/28 0949) BP: 94/62 mmHg (03/28 1130) Pulse Rate: 71 (03/28 1130)  Labs:  Recent Labs  10/10/15 0934  HGB 11.2*  HCT 34.5*  PLT 226    CrCl cannot be calculated (Unknown ideal weight.).   Medical History: Past Medical History  Diagnosis Date  . Fibromyalgia   . Bulging disc   . Arthritis   . Hypothyroid   . Hyperlipidemia LDL goal < 70   . Obesity (BMI 30.0-34.9)   . GERD (gastroesophageal reflux disease)   . CAD (coronary artery disease)     a. NSTEMI 12/2011: BMS-LAD  . Anxiety   . Depression   . Hypertension   . Chronic diastolic CHF (congestive heart failure) [facility](HCC)     Assessment: 55 yo F presents on 3/28 with chest pain. Has extensive cards hx. Pharmacy consulted to dose heparin. Hgb 11.2, plts wnl. No s/s of bleed. No anticoag PTA.  Goal of Therapy:  Heparin level 0.3-0.7 units/ml Monitor platelets by anticoagulation protocol: Yes   Plan:  Give heparin 4,000 units BOLUS Start heparin gtt at 1,000 units/hr Check 6 hr HL Monitor daily HL, CBC, s/s of bleed   Enzo BiNathan Amberlea Spagnuolo, PharmD, BCPS Clinical Pharmacist Pager [phone] 10/10/2015 12:05 PM

## 2015-10-10 NOTE — H&P (Signed)
History & Physical    Patient ID: Erin Lynch MRN: [id number], DOB/AGE: 57/08/1960   Admit date: 10/10/2015  Primary Physician: Galvin ProfferHAGUE, IMRAN P, MD Primary Cardiologist: Dr. Shirlee LatchMcLean (2013)   History of Present Illness    Erin Lynch is a 55 y.o. female with past medical history of CAD (s/p NSTEMI in 12/2011 w/ BMS to LAD), chronic diastolic CHF (EF 40%55% by echo in 12/2011), HTN, HLD, Anxiety, and Hypothyroidism who presents to [facility] on 10/10/2015 for evaluation of chest pain.   The patient reports she has been developing a sternal pressure for the past 2-3 weeks, similar to her previous MI in 2013. She reports associated dyspnea. Denies any nausea, vomiting, or diaphoresis. She initially noted the pressure when performing activities around the house, such as cleaning. Over the past week, there pain has been waking her from sleep. All episodes of her chest pressure have been relieved with SL NTG and rest.  Her last cardiac catheterization was in 12/2011 and showed significant one vessel coronary artery disease with 50% stenosis in the proximal LAD with a hazy appearance. The lesion was interrogated by IVUS which was suggestive of plaque rupture with residual thrombus, therefore a BMS was placed. She was placed on DAPT with ASA and Plavix.  She has continued on ASA and Plavix since. Reports good compliance with the medications in addition to her statin, ACE-I, and BB.  While in the ED, her EKG shows NSR, HR 70, with no acute ST or T-wave changes. WBC 9.8. Hgb 11.2. Platelets 226. K+ 4.6. Creatinine 0.80. Initial troponin negative.   She initially denied any chest discomfort, but throughout the encounter, she reported feeling anxious and having mild discomfort. Her SBP is currently in the 90's after receiving SL NTG. She has been started on IV Heparin.   Past Medical History    Past Medical History  Diagnosis Date  . Fibromyalgia   . Bulging disc   . Arthritis   . Hypothyroid   .  Hyperlipidemia LDL goal < 70   . Obesity (BMI 30.0-34.9)   . GERD (gastroesophageal reflux disease)   . NSTEMI, initial episode of care (HCC) 01/02/2012  . Anxiety   . Depression   . Hypertension   . CHF (congestive heart failure) [facility](HCC)     Past Surgical History  Procedure Laterality Date  . Cardiac catheterization  01/01/2012     WITH CORONARY ANGIOGRAM, left heart   . Coronary stent placement    . Left heart catheterization with coronary angiogram N/A 01/01/2012    Procedure: LEFT HEART CATHETERIZATION WITH CORONARY ANGIOGRAM;  Surgeon: Iran OuchMuhammad A Arida, MD;  Location: [facility] CATH LAB;  Service: Cardiovascular;  Laterality: N/A;     Allergies  No Known Allergies   Home Medications    Prior to Admission medications   Medication Sig Start Date End Date Taking? Authorizing Provider  atorvastatin (LIPITOR) 80 MG tablet Take 1 tablet (80 mg total) by mouth daily. 01/02/12 01/01/13  Rhonda G Barrett, PA-C  buPROPion (WELLBUTRIN XL) 150 MG 24 hr tablet Take 150 mg by mouth daily.    Historical Provider, MD  clopidogrel (PLAVIX) 75 MG tablet TAKE 1 TABLET BY MOUTH EVERY DAY 05/04/13   Laurey Moralealton S McLean, MD  FLUoxetine (PROZAC) 40 MG capsule Take 40 mg by mouth daily.    Historical Provider, MD  HYDROcodone-acetaminophen (LORTAB) 10-500 MG per tablet Take 1 tablet by mouth every 6 (six) hours as needed. For pain    Historical  Provider, MD  levothyroxine (SYNTHROID, LEVOTHROID) 112 MCG tablet Take 112 mcg by mouth daily.    Historical Provider, MD  lisinopril (PRINIVIL,ZESTRIL) 5 MG tablet Take 1 tablet (5 mg total) by mouth daily. 01/02/12 01/01/13  Rhonda G Barrett, PA-C  metoprolol succinate (TOPROL XL) 25 MG 24 hr tablet Take 1 tablet (25 mg total) by mouth daily. 01/02/12 01/01/13  Rhonda G Barrett, PA-C  nitroGLYCERIN (NITROSTAT) 0.4 MG SL tablet Place 1 tablet (0.4 mg total) under the tongue every 5 (five) minutes as needed for chest pain. 01/02/12 01/01/13  Rhonda G Barrett, PA-C  ranitidine  (ZANTAC) 300 MG tablet Take 1 tablet (300 mg total) by mouth at bedtime. 02/05/12   Laurey Morale, MD    Family History    No family history on file.  Social History    Social History   Social History  . Marital Status: Single    Spouse Name: N/A  . Number of Children: 2  . Years of Education: N/A   Occupational History  . retired     Psychiatric nurse   Social History Main Topics  . Smoking status: Current Every Day Smoker -- 1.00 packs/day for 25 years    Types: Cigarettes  . Smokeless tobacco: Not on file     Comment: stop june 2013  . Alcohol Use: No  . Drug Use: No  . Sexual Activity: Not on file   Other Topics Concern  . Not on file   Social History Narrative     Review of Systems    General:  No chills, fever, night sweats or weight changes.  Cardiovascular:  No edema, orthopnea, palpitations, paroxysmal nocturnal dyspnea. Positive for chest pain and dyspnea on exertion.  Dermatological: No rash, lesions/masses Respiratory: No cough, Positive for dyspnea. Urologic: No hematuria, dysuria Abdominal:   No nausea, vomiting, diarrhea, bright red blood per rectum, melena, or hematemesis Neurologic:  No visual changes, wkns, changes in mental status. All other systems reviewed and are otherwise negative except as noted above.  Physical Exam    Blood pressure 98/58, pulse 74, temperature 98.4 F (36.9 C), temperature source Oral, resp. rate 18, SpO2 100 %.  General: Well developed, well nourished,female in no acute distress. Head: Normocephalic, atraumatic, sclera non-icteric, no xanthomas, nares are without discharge. Dentition:  Neck: No carotid bruits. JVD not elevated.  Lungs: Respirations regular and unlabored, without wheezes or rales.  Heart: Regular rate and rhythm. No S3 or S4.  No murmur, no rubs, or gallops appreciated. Abdomen: Soft, non-tender, non-distended with normoactive bowel sounds. No hepatomegaly. No rebound/guarding. No obvious abdominal  masses. Msk:  Strength and tone appear normal for age. No joint deformities or effusions. Extremities: No clubbing or cyanosis. No edema.  Distal pedal pulses are 2+ bilaterally. Neuro: Alert and oriented X 3. Moves all extremities spontaneously. No focal deficits noted. Psych:  Responds to questions appropriately with a normal affect. Skin: No rashes or lesions noted  Labs    Troponin (Point of Care Test) No results for input(s): TROPIPOC in the last 72 hours. No results for input(s): CKTOTAL, CKMB, TROPONINI in the last 72 hours. Lab Results  Component Value Date   WBC 9.7 01/14/2012   HGB 13.3 01/14/2012   HCT 39.0 01/14/2012   MCV 94.6 01/14/2012   PLT 447* 01/14/2012   No results for input(s): NA, K, CL, CO2, BUN, CREATININE, CALCIUM, PROT, BILITOT, ALKPHOS, ALT, AST, GLUCOSE in the last 168 hours.  Invalid input(s): LABALBU Lab Results  Component Value Date   CHOL 228* 01/01/2012   HDL 58 01/01/2012   LDLCALC 134* 01/01/2012   TRIG 181* 01/01/2012    Radiology Studies    Dg Chest 2 View: 10/10/2015  CLINICAL DATA:  Chest pain for 1 week radiating into the neck. Initial encounter. EXAM: CHEST  2 VIEW COMPARISON:  PA and lateral chest 09/28/2015 and 02/09/2012. FINDINGS: The lungs are clear. Heart size is normal. No pneumothorax or pleural effusion. No bony abnormality. IMPRESSION: Negative chest. Electronically Signed   By: Drusilla Kanner M.D.   On: 10/10/2015 10:32    EKG & Cardiac Imaging    EKG: NSR, HR 70, with no acute ST or T-wave changes  Cardiac Catheterization: 12/2011  Diagnostic Procedure Details: I attempted to acces the right radial artery but could not cannulate with the needle. The right groin was prepped, draped, and anesthetized with 1% lidocaine. Using the modified Seldinger technique, a 5 French sheath was introduced into the right femoral artery. Standard Judkins catheters were used for selective coronary angiography and left ventriculography.  Catheter exchanges were performed over a wire. The diagnostic procedure was well-tolerated without immediate complications.  Procedural Findings:  Hemodynamics: AO: 133/79 mmHg LV: 132/1 mmHg LVEDP: 16 mmHg  Coronary angiography: Coronary dominance: Right   Left Main: Normal.  Left Anterior Descending (LAD): Large in size with a 50% hazy stenosis in the proximal segment. The rest of the vessel is smooth and free of significant disease.  1st diagonal (D1): Large in size and free of significant disease.  2nd diagonal (D2): Small in size.  3rd diagonal (D3): Small in size.  Circumflex (LCx): Small in size and nondominant. It has a very steep takeoff from the left main going back 180. The vessel has minor irregularities without obstructive disease.  1st obtuse marginal: Very small in size.  2nd obtuse marginal: Very small in size.  3rd obtuse marginal: Normal in size with minor irregularities.  Right Coronary Artery: Very large in size and dominant. The vessel is smooth and has no evidence of atherosclerosis.  posterior descending artery: Normal.  posterior lateral branch: Normal.  Left ventriculography: Left ventricular systolic function is low normal , LVEF is estimated at 50-55% %, there is no significant mitral regurgitation . There is moderate hypokinesis of the distal anterior wall, apex and distal inferior wall.  PCI Procedure Note: Following the diagnostic procedure, the decision was made to proceed with PCI. The sheath was upsized to a 6 [place]. Weight-based bivalirudin was given for anticoagulation. Once a therapeutic ACT was achieved, a 6 [place] JL 3.5 guide catheter was inserted. A intuition coronary guidewire was used to cross the lesion. The lesion was interrogated with IVUS which showed that although the stenosis is not more than 50%, there is evidence of plaque rupture with residual thrombus. Thus, I decided to proceed with stent  placement especially in the presence of wall motion abnormalities and presentation with non-ST elevation myocardial infarction. The lesion was then stented with a 4.0 x 18 mm vision bare-metal stent. The stent was postdilated with a 4.5 noncompliant balloon. Following PCI, there was 0% residual stenosis and TIMI-3 flow. Final angiography confirmed an excellent result. The patient tolerated the PCI procedure well. There were no immediate procedural complications. The patient was transferred to the post catheterization recovery area for further monitoring.  PCI Data: Vessel - proximal LAD/Segment - 12 Percent Stenosis (pre) 50% (evidence of plaque rupture with residuals thrombus and hazy appearance).  TIMI-flow 3 Stent 4.0 x  18 mm vision bare-metal stent postdilated with a 4.5 noncompliant balloon Percent Stenosis (post) 0 TIMI-flow (post) 3  Final Conclusions:  1. Significant 1 vessel coronary artery disease. There is a 50% stenosis in the proximal LAD with a hazy appearance. . This was interrogated by IVUS which was suggestive of plaque rupture with residual thrombus. The coronary arteries are smooth otherwise. 2. Low normal LV systolic function with distal anterior, apical and distal inferior hypokinesis suggestive of LAD infarct. 3. Successful angioplasty and bare-metal stent placement to the proximal LAD.  Recommendations:  Although the stenosis in the proximal LAD did not appear to be more than 50%, the lesion was hazy and interrogation by IVUS was suggestive of plaque rupture with residual thrombus. Left ventricular angiography was suggestive of LAD infarct. Due to all of these reasons, stent placement was performed. I recommend dual antiplatelet therapy for a minimum of one month but preferably for 12 months. Smoking cessation is strongly advised as well as treatment of risk factors.  Assessment & Plan    1. Unstable Angina/ History of CAD - presents with a 3 week history of chest  pressure associated with shortness of breath. Similar to previous NSTEMI and relieved with SL NTG. - s/p NSTEMI in 12/2011 w/ BMS to LAD - initial troponin negative and EKG without acute ischemic changes. - discussed with Dr. Clifton James. Will plan for cardiac catheterization tomorrow. The patient understands that risks include but are not limited to stroke (1 in 1000), death (1 in 1000), kidney failure [usually temporary] (1 in 500), bleeding (1 in 200), allergic reaction [possibly serious] (1 in 200). She agrees to proceed. Will need to be NPO after midnight. - continue ASA, Plavix, BB, and statin. Started on Heparin by pharmacy consult. If she has refractory pain, would initiate NTG drip. However, her BP is currently soft.  2. Chronic diastolic CHF - EF 55% by echo in 12/2011 - does not appear volume overloaded on physical exam. - continue BB and ACE-I.  3. HLD - continue statin therapy  4. HTN - BP currently soft in the 90's /50's. Occurred after receiving SL NTG.   - continue to monitor.  5.  Anxiety - will continue home medications  Signed, Ellsworth Lennox, PA-C 10/10/2015, 11:30 AM Pager: [phone]  I have personally seen and examined this patient with Randall An, PA-C. I agree with the assessment and plan as outlined above. She has known CAD with prior PCI/stenting of the LAD. Being admitted with chest pain c/w unstable angina. Exam with normally developed female in NAD, RRR without murmurs, lungs clear, no LE edema. Labs reviewed. Troponin negative. EKG without ischemic changes. Will admit to telemetry on heparin. Plan cardiac cath with possible PCI tomorrow. NPO at MN. Risks and benefits of cath reviewed with pt.   MCALHANY,CHRISTOPHER 10/10/2015 12:47 PM

## 2015-10-10 NOTE — ED Provider Notes (Signed)
CSN: [id number]     Arrival date & time 10/10/15  7829 History   First MD Initiated Contact with Patient 10/10/15 1051     Chief Complaint  Patient presents with  . Chest Pain     (Consider location/radiation/quality/duration/timing/severity/associated sxs/prior Treatment) The history is provided by the patient.     Pt with hx CAD, s/p NSTEMI, stent in LAD p/w central chest pressure with associated SOB and lightheadedness.  She has had increased episodes of chest pain and pressure over the past week, relieved in nitroglycerin.  These seem to be random, once waking her from sleep.  Today she has had three episodes.  These feel exactly like the pain she had with her NSTEMI.    Past Medical History  Diagnosis Date  . Fibromyalgia   . Bulging disc   . Arthritis   . Hypothyroid   . Hyperlipidemia LDL goal < 70   . Obesity (BMI 30.0-34.9)   . GERD (gastroesophageal reflux disease)   . NSTEMI, initial episode of care (HCC) 01/02/2012  . Anxiety   . Depression   . Hypertension   . CHF (congestive heart failure) [facility])    Past Surgical History  Procedure Laterality Date  . Cardiac catheterization  01/01/2012     WITH CORONARY ANGIOGRAM, left heart   . Coronary stent placement    . Left heart catheterization with coronary angiogram N/A 01/01/2012    Procedure: LEFT HEART CATHETERIZATION WITH CORONARY ANGIOGRAM;  Surgeon: Iran Ouch, MD;  Location: [facility] CATH LAB;  Service: Cardiovascular;  Laterality: N/A;   No family history on file. Social History  Substance Use Topics  . Smoking status: Current Every Day Smoker -- 1.00 packs/day for 25 years    Types: Cigarettes  . Smokeless tobacco: None     Comment: stop june 2013  . Alcohol Use: No   OB History    No data available     Review of Systems  Unable to perform ROS: Acuity of condition      Allergies  Review of patient's allergies indicates no known allergies.  Home Medications   Prior to Admission medications    Medication Sig Start Date End Date Taking? Authorizing Provider  atorvastatin (LIPITOR) 80 MG tablet Take 1 tablet (80 mg total) by mouth daily. 01/02/12 01/01/13  Rhonda G Barrett, PA-C  buPROPion (WELLBUTRIN XL) 150 MG 24 hr tablet Take 150 mg by mouth daily.    Historical Provider, MD  clopidogrel (PLAVIX) 75 MG tablet TAKE 1 TABLET BY MOUTH EVERY DAY 05/04/13   Laurey Morale, MD  FLUoxetine (PROZAC) 40 MG capsule Take 40 mg by mouth daily.    Historical Provider, MD  HYDROcodone-acetaminophen (LORTAB) 10-500 MG per tablet Take 1 tablet by mouth every 6 (six) hours as needed. For pain    Historical Provider, MD  levothyroxine (SYNTHROID, LEVOTHROID) 112 MCG tablet Take 112 mcg by mouth daily.    Historical Provider, MD  lisinopril (PRINIVIL,ZESTRIL) 5 MG tablet Take 1 tablet (5 mg total) by mouth daily. 01/02/12 01/01/13  Rhonda G Barrett, PA-C  metoprolol succinate (TOPROL XL) 25 MG 24 hr tablet Take 1 tablet (25 mg total) by mouth daily. 01/02/12 01/01/13  Rhonda G Barrett, PA-C  nitroGLYCERIN (NITROSTAT) 0.4 MG SL tablet Place 1 tablet (0.4 mg total) under the tongue every 5 (five) minutes as needed for chest pain. 01/02/12 01/01/13  Rhonda G Barrett, PA-C  ranitidine (ZANTAC) 300 MG tablet Take 1 tablet (300 mg total) by mouth at  bedtime. 02/05/12   Laurey Morale, MD   BP 101/63 mmHg  Pulse 70  Temp(Src) 98.4 F (36.9 C) (Oral)  Resp 16  SpO2 98% Physical Exam  Constitutional: She appears well-developed and well-nourished. No distress.  HENT:  Head: Normocephalic and atraumatic.  Neck: Neck supple.  Cardiovascular: Normal rate and regular rhythm.   Pulmonary/Chest: Effort normal and breath sounds normal. No respiratory distress. She has no wheezes. She has no rales.  Abdominal: Soft. She exhibits no distension. There is no tenderness. There is no rebound and no guarding.  Neurological: She is alert.  Skin: She is not diaphoretic.  Nursing note and vitals reviewed.   ED Course   Procedures (including critical care time) Labs Review Labs Reviewed  BASIC METABOLIC PANEL - Abnormal; Notable for the following:    Calcium 8.7 (*)    All other components within normal limits  CBC - Abnormal; Notable for the following:    RBC 3.60 (*)    Hemoglobin 11.2 (*)    HCT 34.5 (*)    All other components within normal limits  HEPATIC FUNCTION PANEL - Abnormal; Notable for the following:    Total Protein 5.9 (*)    Albumin 3.2 (*)    Indirect Bilirubin 0.2 (*)    All other components within normal limits  BRAIN NATRIURETIC PEPTIDE  LIPASE, BLOOD  HEPATIC FUNCTION PANEL  LIPASE, BLOOD  HEPARIN LEVEL (UNFRACTIONATED)  I-STAT TROPOININ, ED  Rosezena Sensor, ED    Imaging Review Dg Chest 2 View  10/10/2015  CLINICAL DATA:  Chest pain for 1 week radiating into the neck. Initial encounter. EXAM: CHEST  2 VIEW COMPARISON:  PA and lateral chest 09/28/2015 and 02/09/2012. FINDINGS: The lungs are clear. Heart size is normal. No pneumothorax or pleural effusion. No bony abnormality. IMPRESSION: Negative chest. Electronically Signed   By: Drusilla Kanner M.D.   On: 10/10/2015 10:32   I have personally reviewed and evaluated these images and lab results as part of my medical decision-making.   EKG Interpretation   Date/Time:  Tuesday October 10 2015 11:12:18 EDT Ventricular Rate:  70 PR Interval:  134 QRS Duration: 97 QT Interval:  418 QTC Calculation: 451 R Axis:   46 Text Interpretation:  Sinus rhythm normal. no change from old Confirmed by  Donnald Garre, MD, Lebron Conners [phone]) on 10/10/2015 11:22:15 AM       11:13 AM I spoke with Rosann Auerbach, cardmaster.  Concern for unstable angina.  BP currently borderline, will start fluids.  SL nitro given.  Repeat EKG requested.    11:18 AM Tech informs me troponin was drawn at 9:30 but never sent. Will redraw.    11:23 AM  Discussed pt with Dr Donnald Garre who is currently seeing the patient.  Heparin IV and  morphine ordered per our  discussion.    CRITICAL CARE Performed by: Trixie Dredge   Total critical care time: 30 minutes  Critical care time was exclusive of separately billable procedures and treating other patients.  Critical care was necessary to treat or prevent imminent or life-threatening deterioration.  Critical care was time spent personally by me on the following activities: development of treatment plan with patient and/or surrogate as well as nursing, discussions with consultants, evaluation of patient's response to treatment, examination of patient, obtaining history from patient or surrogate, ordering and performing treatments and interventions, ordering and review of laboratory studies, ordering and review of radiographic studies, pulse oximetry and re-evaluation of patient's condition.   MDM  Final diagnoses:  Unstable angina (HCC)    Pt with hx CAD, NSTEMI, stent in LAD p/w increasing frequency of chest pain relieved with nitroglycerin.  Three episodes of chest pain this morning with lightheadedness and SOB that feel like prior NSTEMI.  Please see ED course above.  Pt admitted to cardiology.     Trixie Dredgemily Elishua Radford, PA-C 10/10/15 1455  Arby BarretteMarcy Pfeiffer, MD 10/11/15 [phone]

## 2015-10-10 NOTE — ED Notes (Signed)
Attempted report 

## 2015-10-10 NOTE — ED Notes (Signed)
EMT is transporting pt upstairs. Called [employer] for pt's dinner tray to be taken to her room upstairs.

## 2015-10-10 NOTE — ED Notes (Signed)
Heart healthy dinner tray ordered for pt. 

## 2015-10-10 NOTE — Progress Notes (Signed)
ANTICOAGULATION CONSULT NOTE - Follow up Consult  Pharmacy Consult for heparin Indication: chest pain/ACS  No Known Allergies  Patient Measurements: TBW ~85 kg  Vital Signs: Temp: 98.4 F (36.9 C) (03/28 1653) Temp Source: Oral (03/28 1653) BP: 102/63 mmHg (03/28 1653) Pulse Rate: 78 (03/28 1653)  Labs:  Recent Labs  10/10/15 0934 10/10/15 1758  HGB 11.2*  --   HCT 34.5*  --   PLT 226  --   HEPARINUNFRC  --  0.27*  CREATININE 0.80  --     CrCl cannot be calculated (Unknown ideal weight.).   Medical History: Past Medical History  Diagnosis Date  . Fibromyalgia   . Bulging disc   . Arthritis   . Hypothyroid   . Hyperlipidemia LDL goal < 70   . Obesity (BMI 30.0-34.9)   . GERD (gastroesophageal reflux disease)   . CAD (coronary artery disease)     a. NSTEMI 12/2011: BMS-LAD  . Anxiety   . Depression   . Hypertension   . Chronic diastolic CHF (congestive heart failure) [facility](HCC)     Assessment: 55 yo F presents on 3/28 with chest pain. Has extensive cards hx. HL 0.27 on 1000 units/hr. No issues noted. Cath planned in am.  Goal of Therapy:  Heparin level 0.3-0.7 units/ml Monitor platelets by anticoagulation protocol: Yes   Plan:  1. Increase heparin gtt at 1150 units/hr 2. Monitor daily HL, CBC, s/s of bleed   Pollyann SamplesAndy Tavarius Grewe, PharmD, BCPS 10/10/2015, 6:59 PM Pager: [phone]

## 2015-10-11 ENCOUNTER — Encounter (HOSPITAL_COMMUNITY)
Admission: EM | Disposition: A | Discharge status: Home / Self Care | Payer: Self-pay | Source: Home / Self Care | Attending: Cardiovascular Disease

## 2015-10-11 ENCOUNTER — Encounter (HOSPITAL_COMMUNITY): Payer: Self-pay | Admitting: *Deleted

## 2015-10-11 DIAGNOSIS — Z79899 Other long term (current) drug therapy: Secondary | ICD-10-CM | POA: Diagnosis not present

## 2015-10-11 DIAGNOSIS — I200 Unstable angina: Secondary | ICD-10-CM

## 2015-10-11 DIAGNOSIS — I110 Hypertensive heart disease with heart failure: Secondary | ICD-10-CM | POA: Diagnosis present

## 2015-10-11 DIAGNOSIS — I252 Old myocardial infarction: Secondary | ICD-10-CM | POA: Diagnosis not present

## 2015-10-11 DIAGNOSIS — M797 Fibromyalgia: Secondary | ICD-10-CM | POA: Diagnosis present

## 2015-10-11 DIAGNOSIS — Z6830 Body mass index (BMI) 30.0-30.9, adult: Secondary | ICD-10-CM | POA: Diagnosis not present

## 2015-10-11 DIAGNOSIS — I25118 Atherosclerotic heart disease of native coronary artery with other forms of angina pectoris: Secondary | ICD-10-CM | POA: Diagnosis not present

## 2015-10-11 DIAGNOSIS — F419 Anxiety disorder, unspecified: Secondary | ICD-10-CM | POA: Diagnosis present

## 2015-10-11 DIAGNOSIS — E039 Hypothyroidism, unspecified: Secondary | ICD-10-CM | POA: Diagnosis present

## 2015-10-11 DIAGNOSIS — E669 Obesity, unspecified: Secondary | ICD-10-CM | POA: Diagnosis present

## 2015-10-11 DIAGNOSIS — I5032 Chronic diastolic (congestive) heart failure: Secondary | ICD-10-CM | POA: Diagnosis present

## 2015-10-11 DIAGNOSIS — I2510 Atherosclerotic heart disease of native coronary artery without angina pectoris: Secondary | ICD-10-CM | POA: Insufficient documentation

## 2015-10-11 DIAGNOSIS — Z955 Presence of coronary angioplasty implant and graft: Secondary | ICD-10-CM | POA: Diagnosis not present

## 2015-10-11 DIAGNOSIS — E785 Hyperlipidemia, unspecified: Secondary | ICD-10-CM | POA: Diagnosis present

## 2015-10-11 DIAGNOSIS — I25110 Atherosclerotic heart disease of native coronary artery with unstable angina pectoris: Secondary | ICD-10-CM | POA: Diagnosis present

## 2015-10-11 DIAGNOSIS — Z7902 Long term (current) use of antithrombotics/antiplatelets: Secondary | ICD-10-CM | POA: Diagnosis not present

## 2015-10-11 DIAGNOSIS — F17210 Nicotine dependence, cigarettes, uncomplicated: Secondary | ICD-10-CM | POA: Diagnosis present

## 2015-10-11 DIAGNOSIS — K219 Gastro-esophageal reflux disease without esophagitis: Secondary | ICD-10-CM | POA: Diagnosis present

## 2015-10-11 HISTORY — PX: CARDIAC CATHETERIZATION: SHX172

## 2015-10-11 LAB — CBC
HEMATOCRIT: 35 % — AB (ref 36.0–46.0)
Hemoglobin: 11.1 g/dL — ABNORMAL LOW (ref 12.0–15.0)
MCH: 30.4 pg (ref 26.0–34.0)
MCHC: 31.7 g/dL (ref 30.0–36.0)
MCV: 95.9 fL (ref 78.0–100.0)
Platelets: 337 10*3/uL (ref 150–400)
RBC: 3.65 MIL/uL — ABNORMAL LOW (ref 3.87–5.11)
RDW: 13.8 % (ref 11.5–15.5)
WBC: 9.3 10*3/uL (ref 4.0–10.5)

## 2015-10-11 LAB — LIPID PANEL
CHOLESTEROL: 142 mg/dL (ref 0–200)
HDL: 39 mg/dL — AB (ref >40)
LDL CALC: 78 mg/dL (ref 0–99)
Total CHOL/HDL Ratio: 3.6 RATIO
Triglycerides: 127 mg/dL (ref <150)
VLDL: 25 mg/dL (ref 0–40)

## 2015-10-11 LAB — BASIC METABOLIC PANEL WITH GFR
Anion gap: 8 (ref 5–15)
BUN: 9 mg/dL (ref 6–20)
CO2: 26 mmol/L (ref 22–32)
Calcium: 8.8 mg/dL — ABNORMAL LOW (ref 8.9–10.3)
Chloride: 109 mmol/L (ref 101–111)
Creatinine, Ser: 0.75 mg/dL (ref 0.44–1.00)
GFR calc Af Amer: >60 mL/min
GFR calc non Af Amer: >60 mL/min
Glucose, Bld: 110 mg/dL — ABNORMAL HIGH (ref 65–99)
Potassium: 4 mmol/L (ref 3.5–5.1)
Sodium: 143 mmol/L (ref 135–145)

## 2015-10-11 LAB — TROPONIN I: Troponin I: <0.03 ng/mL

## 2015-10-11 LAB — HEPARIN LEVEL (UNFRACTIONATED)
Heparin Unfractionated: <0.1 IU/mL — ABNORMAL LOW (ref 0.30–0.70)
Heparin Unfractionated: 0.58 IU/mL (ref 0.30–0.70)

## 2015-10-11 LAB — PROTIME-INR
INR: 0.9 (ref 0.00–1.49)
PROTHROMBIN TIME: 12.4 s (ref 11.6–15.2)

## 2015-10-11 SURGERY — LEFT HEART CATH AND CORONARY ANGIOGRAPHY
Anesthesia: LOCAL

## 2015-10-11 MED ORDER — HEPARIN SODIUM (PORCINE) 1000 UNIT/ML IJ SOLN
INTRAMUSCULAR | Status: DC | PRN
  Administered 2015-10-11: 5000 [IU] via INTRAVENOUS

## 2015-10-11 MED ORDER — ALPRAZOLAM 0.5 MG PO TABS
1.0000 mg | ORAL_TABLET | Freq: Three times a day (TID) | ORAL | Status: DC | PRN
  Administered 2015-10-11: 1 mg via ORAL
  Filled 2015-10-11: qty 2

## 2015-10-11 MED ORDER — VERAPAMIL HCL 2.5 MG/ML IV SOLN
INTRAVENOUS | Status: DC | PRN
  Administered 2015-10-11: 16:00:00 via INTRA_ARTERIAL

## 2015-10-11 MED ORDER — LURASIDONE HCL 20 MG PO TABS
60.0000 mg | ORAL_TABLET | Freq: Every day | ORAL | Status: DC
  Administered 2015-10-11: 60 mg via ORAL
  Filled 2015-10-11: qty 3

## 2015-10-11 MED ORDER — LIDOCAINE HCL (PF) 1 % IJ SOLN
INTRAMUSCULAR | Status: DC | PRN
  Administered 2015-10-11: 5 mL via SUBCUTANEOUS

## 2015-10-11 MED ORDER — SODIUM CHLORIDE 0.9% FLUSH: 3.0000 mL | Freq: Two times a day (BID) | INTRAVENOUS | Status: DC

## 2015-10-11 MED ORDER — ISOSORBIDE MONONITRATE ER 30 MG PO TB24: 15.0000 mg | ORAL_TABLET | Freq: Every day | ORAL | Status: DC

## 2015-10-11 MED ORDER — MIDAZOLAM HCL 2 MG/2ML IJ SOLN
INTRAMUSCULAR | Status: AC
  Filled 2015-10-11: qty 2

## 2015-10-11 MED ORDER — HEPARIN (PORCINE) IN NACL 2-0.9 UNIT/ML-% IJ SOLN
INTRAMUSCULAR | Status: AC
  Filled 2015-10-11: qty 1000

## 2015-10-11 MED ORDER — SODIUM CHLORIDE 0.9% FLUSH: 3.0000 mL | INTRAVENOUS | Status: DC | PRN

## 2015-10-11 MED ORDER — FENTANYL CITRATE (PF) 100 MCG/2ML IJ SOLN
INTRAMUSCULAR | Status: DC | PRN
  Administered 2015-10-11 (×2): 25 ug via INTRAVENOUS

## 2015-10-11 MED ORDER — IOPAMIDOL (ISOVUE-370) INJECTION 76%
INTRAVENOUS | Status: DC | PRN
  Administered 2015-10-11: 50 mL

## 2015-10-11 MED ORDER — IOPAMIDOL (ISOVUE-370) INJECTION 76%
INTRAVENOUS | Status: AC
  Filled 2015-10-11: qty 100

## 2015-10-11 MED ORDER — OXYCODONE-ACETAMINOPHEN 5-325 MG PO TABS
1.0000 | ORAL_TABLET | ORAL | Status: DC | PRN
Start: 2015-10-11 — End: 2015-10-12
  Administered 2015-10-11: 2 via ORAL
  Filled 2015-10-11: qty 2

## 2015-10-11 MED ORDER — SODIUM CHLORIDE 0.9 % IV SOLN: 250.0000 mL | INTRAVENOUS | Status: DC | PRN

## 2015-10-11 MED ORDER — VERAPAMIL HCL 2.5 MG/ML IV SOLN
INTRAVENOUS | Status: AC
  Filled 2015-10-11: qty 2

## 2015-10-11 MED ORDER — HEPARIN (PORCINE) IN NACL 2-0.9 UNIT/ML-% IJ SOLN
INTRAMUSCULAR | Status: DC | PRN
  Administered 2015-10-11: 1000 mL

## 2015-10-11 MED ORDER — SODIUM CHLORIDE 0.9 % IV SOLN: Freq: Once | INTRAVENOUS | Status: DC

## 2015-10-11 MED ORDER — SODIUM CHLORIDE 0.9 % IV SOLN: INTRAVENOUS | Status: AC

## 2015-10-11 MED ORDER — ISOSORBIDE MONONITRATE ER 30 MG PO TB24: 30.0000 mg | ORAL_TABLET | Freq: Every day | ORAL | Status: DC

## 2015-10-11 MED ORDER — ASPIRIN 81 MG PO TBEC: 81.0000 mg | DELAYED_RELEASE_TABLET | Freq: Every day | ORAL | Status: AC

## 2015-10-11 MED ORDER — MIDAZOLAM HCL 2 MG/2ML IJ SOLN
INTRAMUSCULAR | Status: DC | PRN
  Administered 2015-10-11: 1 mg via INTRAVENOUS
  Administered 2015-10-11: 2 mg via INTRAVENOUS

## 2015-10-11 MED ORDER — HEPARIN SODIUM (PORCINE) 1000 UNIT/ML IJ SOLN
INTRAMUSCULAR | Status: AC
  Filled 2015-10-11: qty 1

## 2015-10-11 MED ORDER — FENTANYL CITRATE (PF) 100 MCG/2ML IJ SOLN
INTRAMUSCULAR | Status: AC
  Filled 2015-10-11: qty 2

## 2015-10-11 MED ORDER — LIDOCAINE HCL (PF) 1 % IJ SOLN
INTRAMUSCULAR | Status: AC
  Filled 2015-10-11: qty 30

## 2015-10-11 MED ORDER — ISOSORBIDE MONONITRATE 15 MG HALF TABLET
15.0000 mg | ORAL_TABLET | Freq: Every day | ORAL | Status: DC
  Filled 2015-10-11: qty 1

## 2015-10-11 SURGICAL SUPPLY — 13 items
CATH INFINITI 5 FR JL3.5 (CATHETERS) ×2 IMPLANT
CATH INFINITI 5FR ANG PIGTAIL (CATHETERS) ×2 IMPLANT
CATH INFINITI JR4 5F (CATHETERS) ×2 IMPLANT
COVER PRB 48X5XTLSCP FOLD TPE (BAG) ×1 IMPLANT
COVER PROBE 5X48 (BAG) ×1
GLIDESHEATH SLEND SS 6F .021 (SHEATH) ×2 IMPLANT
KIT HEART LEFT (KITS) ×2 IMPLANT
PACK CARDIAC CATHETERIZATION (CUSTOM PROCEDURE TRAY) ×2 IMPLANT
SYR MEDRAD MARK V 150ML (SYRINGE) ×2 IMPLANT
TRANSDUCER W/STOPCOCK (MISCELLANEOUS) ×2 IMPLANT
TUBING CIL FLEX 10 FLL-RA (TUBING) ×2 IMPLANT
WIRE HI TORQ VERSACORE-J 145CM (WIRE) ×2 IMPLANT
WIRE SAFE-T 1.5MM-J .035X260CM (WIRE) ×2 IMPLANT

## 2015-10-11 NOTE — Discharge Summary (Signed)
Discharge Summary    Patient ID: Erin JudeJoan Eley,  MRN: [id number], DOB/AGE: 57/08/1960 57 y.o.  Admit date: 10/10/2015 Discharge date: 10/11/2015  Primary Care Provider: Galvin ProfferHAGUE, IMRAN P Primary Cardiologist: Dr. Shirlee LatchMcLean  Discharge Diagnoses    Principal Problem:   Unstable angina [facility](HCC) Active Problems:   CAD (coronary artery disease)   Coronary artery disease involving native coronary artery   History of Present Illness     Erin Lynch is a 55 y.o. female with past medical history of CAD (s/p NSTEMI in 12/2011 w/ BMS to LAD), chronic diastolic CHF (EF 40%55% by echo in 12/2011), HTN, HLD, Anxiety, and Hypothyroidism who [facility] for evaluation of chest pain.   The patient reported having a sternal pressure with associated dyspnea for the past 2-3 weeks, similar to her previous MI in 2013. She initially noted the pressure when performing activities around the house, such as cleaning. Over the past week, the pain had been waking her from sleep. All episodes of her chest pressure have been relieved with SL NTG and rest.  While in the ED, her EKG showed NSR with the initial troponin value being negative. She had been started on Heparin while in the ED and this was continued. Her case was discussed with Dr. Clifton JamesMcAlhany and a repeat cardiac catheterization was recommended due to her symptoms consistent with unstable angina.The risks and benefits of the procedure were explained to the patient in detail and she agreed to proceed.  Hospital Course     Consultants: None  Overnight, she denied any repeat episodes of chest pain or shortness of breath. Cyclic troponin values remained negative. Her catheterization was performed later that day which showed continued patency of the proximal LAD stent with mild nonobstructive coronary artery disease. Due to her nitroglycerin responsive chest pain, it was thought her symptoms might be secondary to coronary spasm, therefore she was  started on long-acting NTG with Imdur 15mg  daily. The full cath report is included below. She tolerated the procedure well with no complications.  Upon returning to the floor, her TR band was able to be removed per protocol with no evidence of a hematoma or bruit. She ambulated around her room and down the hallway without difficulty. She was last examined by Dr. Clifton JamesMcAlhany and deemed stable for discharge. A staff message will be sent to our office to arrange follow-up.   Discharge Vitals Blood pressure 112/47, pulse 81, temperature 98 F (36.7 C), temperature source Oral, resp. rate 18, weight 198 lb 1.6 oz (89.858 kg), SpO2 100 %.  Filed Weights   10/10/15 1900 10/11/15 0559  Weight: 197 lb 12.8 oz (89.721 kg) 198 lb 1.6 oz (89.858 kg)    Labs & Radiologic Studies     CBC  Recent Labs  10/10/15 0934 10/11/15 0310  WBC 9.8 9.3  HGB 11.2* 11.1*  HCT 34.5* 35.0*  MCV 95.8 95.9  PLT 226 337   Basic Metabolic Panel  Recent Labs  10/10/15 0934 10/11/15 0310  NA 140 143  K 4.6 4.0  CL 105 109  CO2 27 26  GLUCOSE 82 110*  BUN 11 9  CREATININE 0.80 0.75  CALCIUM 8.7* 8.8*   Liver Function Tests  Recent Labs  10/10/15 0934  AST 25  ALT 23  ALKPHOS 96  BILITOT 0.4  PROT 5.9*  ALBUMIN 3.2*    Recent Labs  10/10/15 0934  LIPASE 19   Cardiac Enzymes  Recent Labs  10/10/15  1758 10/10/15 2322 10/11/15 0310  TROPONINI <0.03 <0.03 <0.03   BNP Invalid input(s): POCBNP D-Dimer No results for input(s): DDIMER in the last 72 hours. Hemoglobin A1C No results for input(s): HGBA1C in the last 72 hours. Fasting Lipid Panel  Recent Labs  10/11/15 0310  CHOL 142  HDL 39*  LDLCALC 78  TRIG 161  CHOLHDL 3.6   Thyroid Function Tests No results for input(s): TSH, T4TOTAL, T3FREE, THYROIDAB in the last 72 hours.  Invalid input(s): FREET3  Dg Chest 2 View: 10/10/2015  CLINICAL DATA:  Chest pain for 1 week radiating into the neck. Initial encounter. EXAM: CHEST   2 VIEW COMPARISON:  PA and lateral chest 09/28/2015 and 02/09/2012. FINDINGS: The lungs are clear. Heart size is normal. No pneumothorax or pleural effusion. No bony abnormality. IMPRESSION: Negative chest. Electronically Signed   By: Drusilla Kanner M.D.   On: 10/10/2015 10:32    Diagnostic Studies/Procedures    Cardiac Catheterization: 10/11/2015      Ost RCA lesion, 30% stenosed.  Ost LM lesion, 20% stenosed.  Ost 1st Diag to 1st Diag lesion, 50% stenosed.  The left ventricular systolic function is normal.  1. Continued patency of the proximal LAD stent 2. Mild nonobstructive coronary artery disease 3. Normal LV systolic function  Recommendations: The patient has no evidence of significant coronary obstruction. Advise medical therapy and tobacco cessation. She does have nitroglycerin responsive chest pain and it might be reasonable to start her on a low-dose of a long-acting nitrate drug.   Disposition   Pt is being discharged home today in good condition.  Follow-up Plans & Appointments    Follow-up Information    Follow up with [facility].   Specialty:  Cardiology   Why:  The office will call you to arrange Cardiology Follow-Up.   Contact information:   [address], [address] [place] [address] [phone]     Discharge Instructions    Diet - low sodium heart healthy    Complete by:  As directed      Discharge instructions    Complete by:  As directed   PLEASE REMEMBER TO BRING ALL OF YOUR MEDICATIONS TO EACH OF YOUR FOLLOW-UP OFFICE VISITS.  PLEASE ATTEND ALL SCHEDULED FOLLOW-UP APPOINTMENTS.   Activity: Increase activity slowly as tolerated. You may shower, but no soaking baths (or swimming) for 1 week. No driving for 24 hours. No lifting over 5 lbs for 1 week. No sexual activity for 1 week.   You May Return to Work: in 1 week (if applicable)  Wound Care: You may wash cath site gently with soap and water. Keep  cath site clean and dry. If you notice pain, swelling, bleeding or pus at your cath site, please call [phone].     Increase activity slowly    Complete by:  As directed            Discharge Medications   Current Discharge Medication List    START taking these medications   Details  aspirin EC 81 MG EC tablet Take 1 tablet (81 mg total) by mouth daily.    isosorbide mononitrate (IMDUR) 30 MG 24 hr tablet Take 0.5 tablets (15 mg total) by mouth daily. Qty: 30 tablet, Refills: 6      CONTINUE these medications which have NOT CHANGED   Details  ALPRAZolam (XANAX) 1 MG tablet Take 1 mg by mouth 3 (three) times daily. Refills: 0    atorvastatin (LIPITOR)  80 MG tablet Take 80 mg by mouth daily. Refills: 1    clopidogrel (PLAVIX) 75 MG tablet TAKE 1 TABLET BY MOUTH EVERY DAY Qty: 15 tablet, Refills: 0    cyclobenzaprine (FLEXERIL) 10 MG tablet Take 10 mg by mouth 3 (three) times daily. Refills: 2    DULoxetine (CYMBALTA) 60 MG capsule Take 60 mg by mouth daily. Refills: 1    LATUDA 60 MG TABS Take 60 mg by mouth daily. Refills: 0    levothyroxine (SYNTHROID, LEVOTHROID) 125 MCG tablet Take 125 mcg by mouth daily.    lisinopril (PRINIVIL,ZESTRIL) 5 MG tablet Take 5 mg by mouth daily. Refills: 1    metoprolol succinate (TOPROL-XL) 25 MG 24 hr tablet Take 25 mg by mouth daily. Refills: 1    omeprazole (PRILOSEC) 40 MG capsule Take 40 mg by mouth daily. Refills: 2    pramipexole (MIRAPEX) 0.125 MG tablet Take 0.125 mg by mouth at bedtime as needed (restless legs).  Refills: 2    ranitidine (ZANTAC) 300 MG tablet Take 1 tablet (300 mg total) by mouth at bedtime.    zolpidem (AMBIEN) 10 MG tablet Take 10 mg by mouth at bedtime as needed. for sleep Refills: 1    nitroGLYCERIN (NITROSTAT) 0.4 MG SL tablet Place 1 tablet (0.4 mg total) under the tongue every 5 (five) minutes as needed for chest pain. Qty: 25 tablet, Refills: 3      STOP taking these medications       aspirin 325 MG tablet      buPROPion (WELLBUTRIN XL) 150 MG 24 hr tablet          Allergies No Known Allergies   Outstanding Labs/Studies   None  Duration of Discharge Encounter   Greater than 30 minutes including physician time.  Signed, Ellsworth Lennox, PA-C 10/11/2015, 7:24 PM

## 2015-10-11 NOTE — H&P (View-Only) (Signed)
  Hospital Problem List     Principal Problem:   Unstable angina (HCC) Active Problems:   CAD (coronary artery disease)    Patient Profile:   Primary Cardiologist: Dr. McLean (2013)  55 y.o. female w/ PMH of CAD (s/p NSTEMI in 12/2011 w/ BMS to LAD), chronic diastolic CHF (EF 55% by echo in 12/2011), HTN, HLD, Anxiety, and Hypothyroidism who presented to [facility] [place] ED on 10/10/2015 for evaluation of chest pain.   Subjective   Denies any repeat episodes of chest discomfort or shortness of breath overnight. Did not sleep well due to her back hurting and being anxious for her procedure today.  Inpatient Medications    . aspirin EC  81 mg Oral Daily  . atorvastatin  80 mg Oral q1800  . buPROPion  150 mg Oral Daily  . clopidogrel  75 mg Oral Daily  . famotidine  20 mg Oral Daily  . FLUoxetine  40 mg Oral Daily  . levothyroxine  112 mcg Oral QAC breakfast  . lisinopril  5 mg Oral Daily  . lurasidone  60 mg Oral Daily  . metoprolol succinate  25 mg Oral Daily  . sodium chloride flush  3 mL Intravenous Q12H    Vital Signs    Filed Vitals:   10/10/15 1653 10/10/15 1900 10/10/15 2046 10/11/15 0559  BP: 102/63  116/53 105/67  Pulse: 78  101 80  Temp: 98.4 F (36.9 C)  98 F (36.7 C) 97.7 F (36.5 C)  TempSrc: Oral  Oral Oral  Resp: 18  18 18  Weight:  197 lb 12.8 oz (89.721 kg)  198 lb 1.6 oz (89.858 kg)  SpO2: 100%  98% 100%    Intake/Output Summary (Last 24 hours) at 10/11/15 1052 Last data filed at 10/11/15 0600  Gross per 24 hour  Intake   1000 ml  Output    500 ml  Net    500 ml   Filed Weights   10/10/15 1900 10/11/15 0559  Weight: 197 lb 12.8 oz (89.721 kg) 198 lb 1.6 oz (89.858 kg)    Physical Exam    General: Well developed, well nourished, female appearing in no acute distress. Head: Normocephalic, atraumatic.  Neck: Supple without bruits, JVD not elevated. Lungs:  Resp regular and unlabored, CTA without wheezing or rales. Heart: RRR, S1, S2, no  S3, S4, or murmur; no rub. Abdomen: Soft, non-tender, non-distended with normoactive bowel sounds. No hepatomegaly. No rebound/guarding. No obvious abdominal masses. Extremities: No clubbing, cyanosis, or edema. Distal pedal pulses are 2+ bilaterally. Neuro: Alert and oriented X 3. Moves all extremities spontaneously. Psych: Normal affect.  Labs    CBC  Recent Labs  10/10/15 0934 10/11/15 0310  WBC 9.8 9.3  HGB 11.2* 11.1*  HCT 34.5* 35.0*  MCV 95.8 95.9  PLT 226 337   Basic Metabolic Panel  Recent Labs  10/10/15 0934 10/11/15 0310  NA 140 143  K 4.6 4.0  CL 105 109  CO2 27 26  GLUCOSE 82 110*  BUN 11 9  CREATININE 0.80 0.75  CALCIUM 8.7* 8.8*   Liver Function Tests  Recent Labs  10/10/15 0934  AST 25  ALT 23  ALKPHOS 96  BILITOT 0.4  PROT 5.9*  ALBUMIN 3.2*    Recent Labs  10/10/15 0934  LIPASE 19   Cardiac Enzymes  Recent Labs  10/10/15 1758 10/10/15 2322 10/11/15 0310  TROPONINI <0.03 <0.03 <0.03   Fasting Lipid Panel  Recent Labs  10/11/15 0310    CHOL 142  HDL 39*  LDLCALC 78  TRIG 127  CHOLHDL 3.6    Telemetry    NSR, HR in 70's - 80's. No atopic events.  ECG    NSR, HR 70. No acute ST or T-wave changes.   Cardiac Studies and Radiology    Dg Chest 2 View: 10/10/2015  CLINICAL DATA:  Chest pain for 1 week radiating into the neck. Initial encounter. EXAM: CHEST  2 VIEW COMPARISON:  PA and lateral chest 09/28/2015 and 02/09/2012. FINDINGS: The lungs are clear. Heart size is normal. No pneumothorax or pleural effusion. No bony abnormality. IMPRESSION: Negative chest. Electronically Signed   By: Thomas  Dalessio M.D.   On: 10/10/2015 10:32   Assessment & Plan    1. Unstable Angina/ History of CAD - presented with a 3 week history of chest pressure associated with shortness of breath. Similar to previous NSTEMI and relieved with SL NTG. - s/p NSTEMI in 12/2011 w/ BMS to LAD - cyclic troponin values have been negative and EKG  without acute ischemic changes. - started on IV Heparin while in the ED. For cardiac catheterization today with Dr. Cooper. The risks and benefits have been reviewed with the patient and she is in agreement to proceed. - continue ASA, statin, Plavix, BB, and ACE-I.  2. Chronic diastolic CHF - EF 55% by echo in 12/2011 - does not appear volume overloaded on physical exam. - continue BB and ACE-I.  3. HLD - continue statin therapy  4. HTN - BP remains stable. - continue current medication regimen.  5. Anxiety - will continue home medications  Signed, Brittany M Strader , PA-C 10:52 AM 10/11/2015 Pager: [phone]  I have personally seen and examined this patient with Brittany Strader, PA-C. I agree with the assessment and plan as outlined above. H/O CAD, admitted with unstable angina. Will plan cath later today.   Nicolaas Savo 10/11/2015 11:34 AM   

## 2015-10-11 NOTE — Progress Notes (Signed)
ANTICOAGULATION CONSULT NOTE - Follow Up Consult  Pharmacy Consult for Heparin  Indication: chest pain/ACS  No Known Allergies  Patient Measurements: Weight: 197 lb 12.8 oz (89.721 kg)  Vital Signs: Temp: 98 F (36.7 C) (03/28 2046) Temp Source: Oral (03/28 2046) BP: 116/53 mmHg (03/28 2046) Pulse Rate: 101 (03/28 2046)  Labs:  Recent Labs  10/10/15 0934 10/10/15 1758 10/10/15 2322 10/11/15 0310  HGB 11.2*  --   --  11.1*  HCT 34.5*  --   --  35.0*  PLT 226  --   --  337  LABPROT  --   --   --  12.4  INR  --   --   --  0.90  HEPARINUNFRC  --  0.27*  --  <0.10*  CREATININE 0.80  --   --   --   TROPONINI  --  <0.03 <0.03  --      Assessment: Heparin level is sub-therapeutic this AM, rate was never increased to 1150 units/hr, also heparin was off for about 3 hours last night due to access issues.   Goal of Therapy:  Heparin level 0.3-0.7 units/ml Monitor platelets by anticoagulation protocol: Yes   Plan:  -Increase heparin to 1150 units/hr as previously ordered  -1200 HL, pending cath  Abran DukeLedford, Forestine Macho 10/11/2015,4:24 AM

## 2015-10-11 NOTE — Progress Notes (Signed)
ANTICOAGULATION CONSULT NOTE - Follow Up Consult  Pharmacy Consult for Heparin Indication: chest pain/ACS  No Known Allergies  Patient Measurements: Weight: 198 lb 1.6 oz (89.858 kg) Heparin Dosing Weight:   Vital Signs: Temp: 97.7 F (36.5 C) (03/29 0559) Temp Source: Oral (03/29 0559) BP: 105/67 mmHg (03/29 0559) Pulse Rate: 80 (03/29 0559)  Labs:  Recent Labs  10/10/15 0934 10/10/15 1758 10/10/15 2322 10/11/15 0310 10/11/15 1134  HGB 11.2*  --   --  11.1*  --   HCT 34.5*  --   --  35.0*  --   PLT 226  --   --  337  --   LABPROT  --   --   --  12.4  --   INR  --   --   --  0.90  --   HEPARINUNFRC  --  0.27*  --  <0.10* 0.58  CREATININE 0.80  --   --  0.75  --   TROPONINI  --  <0.03 <0.03 <0.03  --     CrCl cannot be calculated (Unknown ideal weight.).   Assessment: 55 yo F presents on 3/28 with chest pain. Has extensive cards hx. Pharmacy consulted to dose heparin. Hgb 11.2, plts wnl. No s/s of bleed. No anticoag PTA.  Anticoagulation: CP on IV heparin (enzymes negative). HL 0.58 now in goal.   Goal of Therapy:  Heparin level 0.3-0.7 units/ml Monitor platelets by anticoagulation protocol: Yes   Plan:   Cath today. F/u MD sticky note regarding Plavix/Prozac interaction. Heparin 1150 units/hr. Will f/u after for results.  Merilynn Finlandobertson, Levi StraussCrystal Stillinger 10/11/2015,1:30 PM

## 2015-10-11 NOTE — Interval H&P Note (Signed)
Cath Lab Visit (complete for each Cath Lab visit)  Clinical Evaluation Leading to the Procedure:   ACS: No.  Non-ACS:    Anginal Classification: CCS III  Anti-ischemic medical therapy: Minimal Therapy (1 class of medications)  Non-Invasive Test Results: No non-invasive testing performed  Prior CABG: No previous CABG      History and Physical Interval Note:  10/11/2015 3:29 PM  Erin Lynch  has presented today for surgery, with the diagnosis of Unstable Angina  The various methods of treatment have been discussed with the patient and family. After consideration of risks, benefits and other options for treatment, the patient has consented to  Procedure(s): Left Heart Cath and Coronary Angiography (N/A) as a surgical intervention .  The patient's history has been reviewed, patient examined, no change in status, stable for surgery.  I have reviewed the patient's chart and labs.  Questions were answered to the patient's satisfaction.     Tonny Bollmanooper, Shannette Tabares

## 2015-10-11 NOTE — Progress Notes (Signed)
Hospital Problem List     Principal Problem:   Unstable angina [facility]) Active Problems:   CAD (coronary artery disease)    Patient Profile:   Primary Cardiologist: Dr. Shirlee Latch (2013)  55 y.o. female w/ PMH of CAD (s/p NSTEMI in 12/2011 w/ BMS to LAD), chronic diastolic CHF (EF 02% by echo in 12/2011), HTN, HLD, Anxiety, and Hypothyroidism who presented to [facility] ED on 10/10/2015 for evaluation of chest pain.   Subjective   Denies any repeat episodes of chest discomfort or shortness of breath overnight. Did not sleep well due to her back hurting and being anxious for her procedure today.  Inpatient Medications    . aspirin EC  81 mg Oral Daily  . atorvastatin  80 mg Oral q1800  . buPROPion  150 mg Oral Daily  . clopidogrel  75 mg Oral Daily  . famotidine  20 mg Oral Daily  . FLUoxetine  40 mg Oral Daily  . levothyroxine  112 mcg Oral QAC breakfast  . lisinopril  5 mg Oral Daily  . lurasidone  60 mg Oral Daily  . metoprolol succinate  25 mg Oral Daily  . sodium chloride flush  3 mL Intravenous Q12H    Vital Signs    Filed Vitals:   10/10/15 1653 10/10/15 1900 10/10/15 2046 10/11/15 0559  BP: 102/63  116/53 105/67  Pulse: 78  101 80  Temp: 98.4 F (36.9 C)  98 F (36.7 C) 97.7 F (36.5 C)  TempSrc: Oral  Oral Oral  Resp: Weight:  197 lb 12.8 oz (89.721 kg)  198 lb 1.6 oz (89.858 kg)  SpO2: 100%  98% 100%    Intake/Output Summary (Last 24 hours) at 10/11/15 1052 Last data filed at 10/11/15 0600  Gross per 24 hour  Intake   1000 ml  Output    500 ml  Net    500 ml   Filed Weights   10/10/15 1900 10/11/15 0559  Weight: 197 lb 12.8 oz (89.721 kg) 198 lb 1.6 oz (89.858 kg)    Physical Exam    General: Well developed, well nourished, female appearing in no acute distress. Head: Normocephalic, atraumatic.  Neck: Supple without bruits, JVD not elevated. Lungs:  Resp regular and unlabored, CTA without wheezing or rales. Heart: RRR, S1, S2, no  S3, S4, or murmur; no rub. Abdomen: Soft, non-tender, non-distended with normoactive bowel sounds. No hepatomegaly. No rebound/guarding. No obvious abdominal masses. Extremities: No clubbing, cyanosis, or edema. Distal pedal pulses are 2+ bilaterally. Neuro: Alert and oriented X 3. Moves all extremities spontaneously. Psych: Normal affect.  Labs    CBC  Recent Labs  10/10/15 0934 10/11/15 0310  WBC 9.8 9.3  HGB 11.2* 11.1*  HCT 34.5* 35.0*  MCV 95.8 95.9  PLT 226 337   Basic Metabolic Panel  Recent Labs  10/10/15 0934 10/11/15 0310  NA 140 143  K 4.6 4.0  CL 105 109  CO2 27 26  GLUCOSE 82 110*  BUN 11 9  CREATININE 0.80 0.75  CALCIUM 8.7* 8.8*   Liver Function Tests  Recent Labs  10/10/15 0934  AST 25  ALT 23  ALKPHOS 96  BILITOT 0.4  PROT 5.9*  ALBUMIN 3.2*    Recent Labs  10/10/15 0934  LIPASE 19   Cardiac Enzymes  Recent Labs  10/10/15 1758 10/10/15 2322 10/11/15 0310  TROPONINI <0.03 <0.03 <0.03   Fasting Lipid Panel  Recent Labs  10/11/15 0310  CHOL 142  HDL 39*  LDLCALC 78  TRIG [id number]  CHOLHDL 3.6    Telemetry    NSR, HR in 70's - 80's. No atopic events.  ECG    NSR, HR 70. No acute ST or T-wave changes.   Cardiac Studies and Radiology    Dg Chest 2 View: 10/10/2015  CLINICAL DATA:  Chest pain for 1 week radiating into the neck. Initial encounter. EXAM: CHEST  2 VIEW COMPARISON:  PA and lateral chest 09/28/2015 and 02/09/2012. FINDINGS: The lungs are clear. Heart size is normal. No pneumothorax or pleural effusion. No bony abnormality. IMPRESSION: Negative chest. Electronically Signed   By: Drusilla Kannerhomas  Dalessio M.D.   On: 10/10/2015 10:32   Assessment & Plan    1. Unstable Angina/ History of CAD - presented with a 3 week history of chest pressure associated with shortness of breath. Similar to previous NSTEMI and relieved with SL NTG. - s/p NSTEMI in 12/2011 w/ BMS to LAD - cyclic troponin values have been negative and EKG  without acute ischemic changes. - started on IV Heparin while in the ED. For cardiac catheterization today with Dr. Excell Seltzerooper. The risks and benefits have been reviewed with the patient and she is in agreement to proceed. - continue ASA, statin, Plavix, BB, and ACE-I.  2. Chronic diastolic CHF - EF 55% by echo in 12/2011 - does not appear volume overloaded on physical exam. - continue BB and ACE-I.  3. HLD - continue statin therapy  4. HTN - BP remains stable. - continue current medication regimen.  5. Anxiety - will continue home medications  Signed, Ellsworth LennoxBrittany M Strader , PA-C 10:52 AM 10/11/2015 Pager: [phone]  I have personally seen and examined this patient with Randall AnBrittany Strader, PA-C. I agree with the assessment and plan as outlined above. H/O CAD, admitted with unstable angina. Will plan cath later today.   Grayce Budden 10/11/2015 11:34 AM

## 2015-10-12 ENCOUNTER — Encounter (HOSPITAL_COMMUNITY): Payer: Self-pay | Admitting: Cardiovascular Disease

## 2016-11-23 ENCOUNTER — Emergency Department
Admission: EM | Admit: 2016-11-23 | Discharge: 2016-11-24 | Disposition: A | Discharge status: Other Acute Inpatient Hospital | Payer: Medicare Other | Attending: Emergency Medicine | Admitting: Emergency Medicine

## 2016-11-23 ENCOUNTER — Encounter: Payer: Self-pay | Admitting: Emergency Medicine

## 2016-11-23 DIAGNOSIS — Z7982 Long term (current) use of aspirin: Secondary | ICD-10-CM | POA: Diagnosis not present

## 2016-11-23 DIAGNOSIS — I5032 Chronic diastolic (congestive) heart failure: Secondary | ICD-10-CM | POA: Diagnosis not present

## 2016-11-23 DIAGNOSIS — F322 Major depressive disorder, single episode, severe without psychotic features: Secondary | ICD-10-CM | POA: Diagnosis not present

## 2016-11-23 DIAGNOSIS — F17210 Nicotine dependence, cigarettes, uncomplicated: Secondary | ICD-10-CM | POA: Diagnosis not present

## 2016-11-23 DIAGNOSIS — F315 Bipolar disorder, current episode depressed, severe, with psychotic features: Secondary | ICD-10-CM

## 2016-11-23 DIAGNOSIS — E039 Hypothyroidism, unspecified: Secondary | ICD-10-CM | POA: Insufficient documentation

## 2016-11-23 DIAGNOSIS — I2510 Atherosclerotic heart disease of native coronary artery without angina pectoris: Secondary | ICD-10-CM | POA: Diagnosis not present

## 2016-11-23 DIAGNOSIS — Z79899 Other long term (current) drug therapy: Secondary | ICD-10-CM | POA: Insufficient documentation

## 2016-11-23 DIAGNOSIS — Z046 Encounter for general psychiatric examination, requested by authority: Secondary | ICD-10-CM | POA: Diagnosis present

## 2016-11-23 DIAGNOSIS — I110 Hypertensive heart disease with heart failure: Secondary | ICD-10-CM | POA: Diagnosis not present

## 2016-11-23 LAB — URINALYSIS, COMPLETE (UACMP) WITH MICROSCOPIC
Bilirubin Urine: NEGATIVE
Glucose, UA: NEGATIVE mg/dL
HGB URINE DIPSTICK: NEGATIVE
Ketones, ur: NEGATIVE mg/dL
Leukocytes, UA: NEGATIVE
NITRITE: NEGATIVE
PH: 7 (ref 5.0–8.0)
Protein, ur: NEGATIVE mg/dL
Specific Gravity, Urine: 1.009 (ref 1.005–1.030)

## 2016-11-23 LAB — CBC
HCT: 36 % (ref 35.0–47.0)
Hemoglobin: 11.7 g/dL — ABNORMAL LOW (ref 12.0–16.0)
MCH: 28.1 pg (ref 26.0–34.0)
MCHC: 32.5 g/dL (ref 32.0–36.0)
MCV: 86.6 fL (ref 80.0–100.0)
PLATELETS: 477 10*3/uL — AB (ref 150–440)
RBC: 4.16 MIL/uL (ref 3.80–5.20)
RDW: 16.4 % — AB (ref 11.5–14.5)
WBC: 15.6 10*3/uL — ABNORMAL HIGH (ref 3.6–11.0)

## 2016-11-23 LAB — COMPREHENSIVE METABOLIC PANEL
ALT: 26 U/L (ref 14–54)
ANION GAP: 9 (ref 5–15)
AST: 25 U/L (ref 15–41)
Albumin: 3.7 g/dL (ref 3.5–5.0)
Alkaline Phosphatase: 105 U/L (ref 38–126)
BUN: 12 mg/dL (ref 6–20)
CHLORIDE: 103 mmol/L (ref 101–111)
CO2: 22 mmol/L (ref 22–32)
CREATININE: 0.81 mg/dL (ref 0.44–1.00)
Calcium: 8.8 mg/dL — ABNORMAL LOW (ref 8.9–10.3)
Glucose, Bld: 148 mg/dL — ABNORMAL HIGH (ref 65–99)
Potassium: 3.8 mmol/L (ref 3.5–5.1)
Sodium: 134 mmol/L — ABNORMAL LOW (ref 135–145)
Total Bilirubin: 0.4 mg/dL (ref 0.3–1.2)
Total Protein: 7.3 g/dL (ref 6.5–8.1)

## 2016-11-23 LAB — URINE DRUG SCREEN, QUALITATIVE (ARMC ONLY)
Amphetamines, Ur Screen: NOT DETECTED
BENZODIAZEPINE, UR SCRN: POSITIVE — AB
Barbiturates, Ur Screen: NOT DETECTED
CANNABINOID 50 NG, UR ~~LOC~~: NOT DETECTED
COCAINE METABOLITE, UR ~~LOC~~: NOT DETECTED
MDMA (Ecstasy)Ur Screen: NOT DETECTED
Methadone Scn, Ur: NOT DETECTED
Opiate, Ur Screen: NOT DETECTED
Phencyclidine (PCP) Ur S: NOT DETECTED
TRICYCLIC, UR SCREEN: POSITIVE — AB

## 2016-11-23 LAB — ETHANOL: Alcohol, Ethyl (B): <5 mg/dL (ref <5)

## 2016-11-23 LAB — ACETAMINOPHEN LEVEL

## 2016-11-23 LAB — SALICYLATE LEVEL

## 2016-11-23 MED ORDER — ALPRAZOLAM 0.5 MG PO TABS
1.0000 mg | ORAL_TABLET | Freq: Once | ORAL | Status: AC
  Administered 2016-11-23: 1 mg via ORAL
  Filled 2016-11-23: qty 2

## 2016-11-23 MED ORDER — ATORVASTATIN CALCIUM 20 MG PO TABS
80.0000 mg | ORAL_TABLET | Freq: Every day | ORAL | Status: DC
  Administered 2016-11-23 – 2016-11-24 (×2): 80 mg via ORAL
  Filled 2016-11-23 (×2): qty 4

## 2016-11-23 MED ORDER — ALPRAZOLAM 0.5 MG PO TABS
0.5000 mg | ORAL_TABLET | Freq: Once | ORAL | Status: AC
  Administered 2016-11-23: 0.5 mg via ORAL
  Filled 2016-11-23: qty 1

## 2016-11-23 MED ORDER — ISOSORBIDE MONONITRATE ER 30 MG PO TB24
15.0000 mg | ORAL_TABLET | Freq: Every day | ORAL | Status: DC
  Administered 2016-11-24: 15 mg via ORAL
  Filled 2016-11-23 (×2): qty 1

## 2016-11-23 MED ORDER — PANTOPRAZOLE SODIUM 40 MG PO TBEC
40.0000 mg | DELAYED_RELEASE_TABLET | Freq: Every day | ORAL | Status: DC
  Administered 2016-11-23 – 2016-11-24 (×2): 40 mg via ORAL
  Filled 2016-11-23 (×2): qty 1

## 2016-11-23 MED ORDER — IBUPROFEN 600 MG PO TABS
600.0000 mg | ORAL_TABLET | Freq: Four times a day (QID) | ORAL | Status: DC | PRN
Start: 2016-11-23 — End: 2016-11-24
  Administered 2016-11-23: 600 mg via ORAL
  Filled 2016-11-23: qty 1

## 2016-11-23 MED ORDER — ZOLPIDEM TARTRATE 5 MG PO TABS
10.0000 mg | ORAL_TABLET | Freq: Every day | ORAL | Status: DC
  Administered 2016-11-23: 10 mg via ORAL
  Filled 2016-11-23: qty 2

## 2016-11-23 MED ORDER — LISINOPRIL 5 MG PO TABS
5.0000 mg | ORAL_TABLET | Freq: Every day | ORAL | Status: DC
  Administered 2016-11-23 – 2016-11-24 (×2): 5 mg via ORAL
  Filled 2016-11-23 (×2): qty 1

## 2016-11-23 MED ORDER — LORAZEPAM 1 MG PO TABS
1.0000 mg | ORAL_TABLET | Freq: Four times a day (QID) | ORAL | Status: DC | PRN
  Administered 2016-11-23 – 2016-11-24 (×2): 1 mg via ORAL
  Filled 2016-11-23: qty 1

## 2016-11-23 MED ORDER — ASPIRIN EC 81 MG PO TBEC
DELAYED_RELEASE_TABLET | ORAL | Status: AC
  Filled 2016-11-23: qty 1

## 2016-11-23 MED ORDER — CLOPIDOGREL BISULFATE 75 MG PO TABS
75.0000 mg | ORAL_TABLET | Freq: Every day | ORAL | Status: DC
  Administered 2016-11-24: 75 mg via ORAL
  Filled 2016-11-23 (×2): qty 1

## 2016-11-23 MED ORDER — ASPIRIN EC 81 MG PO TBEC
81.0000 mg | DELAYED_RELEASE_TABLET | Freq: Every day | ORAL | Status: DC
  Administered 2016-11-23 – 2016-11-24 (×2): 81 mg via ORAL
  Filled 2016-11-23: qty 1

## 2016-11-23 MED ORDER — LORAZEPAM 1 MG PO TABS
ORAL_TABLET | ORAL | Status: AC
  Filled 2016-11-23: qty 1

## 2016-11-23 MED ORDER — GABAPENTIN 300 MG PO CAPS
300.0000 mg | ORAL_CAPSULE | Freq: Three times a day (TID) | ORAL | Status: DC
  Administered 2016-11-24: 300 mg via ORAL
  Filled 2016-11-23 (×2): qty 1

## 2016-11-23 MED ORDER — IBUPROFEN 600 MG PO TABS
600.0000 mg | ORAL_TABLET | ORAL | Status: AC
  Administered 2016-11-23: 600 mg via ORAL
  Filled 2016-11-23: qty 1

## 2016-11-23 NOTE — ED Triage Notes (Signed)
Patient presents to the ED very tearful.  Patient states, "I want to go into the hospital and figure out what's wrong.  I don't want to go home."  Patient states, "I have thought about suicidal, just general thinking but I wouldn't ever do that."   Patient states she has been crying for 6 weeks, since her daughter and her daughter's husband broke up.  Patient states, "since then things have been getting bigger and bigger and I'm so depressed."  Patient states she saw a counselor many years ago but none recently.  Patient states, "I'm on a whole bunch of medicines, maybe some of them I don't need or maybe some of them could be changed."

## 2016-11-23 NOTE — ED Notes (Signed)
Pt VOL/ Pending Placement downstairs to Behavioral Medicine
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
## 2016-11-23 NOTE — ED Notes (Addendum)
Pt agitated stating, "If I knew I'd be locked up I wouldn't have come here. I need to have something strong if I'm going to stay here."
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
## 2016-11-23 NOTE — ED Notes (Signed)
Pt VOL/ Pending Placement downstairs to Behavioral Medicine 

## 2016-11-23 NOTE — ED Provider Notes (Signed)
[facility] Emergency Department Provider Note  ____________________________________________   First MD Initiated Contact with Patient 11/23/16 1026     (approximate)  I have reviewed the triage vital signs and the nursing notes.   HISTORY  Chief Complaint Psychiatric Evaluation   HPI Erin Lynch is a 56 y.o. female here for evaluation of feeling very depressed.  Patient presents today reports that for about the last 6 weeks she's been feeling extremely depressed, she is contemplated that she just needs to be hospitalized as she is suffering from an incredible amounts of stress, depression, and feels at times that she is thinking about suicide from time to time but that she would never do it. She reports that she doesn't think she would harm herself, and she came in here voluntarily today  She denies any recent illness, except had a cough about a month ago that went away after treatment with prednisone. No fevers chills or any more cough or trouble breathing. No chest pain. She reports she is taking all of her medications regularly and as prescribed, except she was transitioned from Xanax to Klonopin by her primary doctor about a month ago and she reports a Klonopin does not help at all for her anxiety and depression   Past Medical History:  Diagnosis Date  . Anxiety   . Arthritis   . Bulging disc   . CAD (coronary artery disease)    a. NSTEMI 12/2011: BMS-LAD b. 09/2015: cath showing patent stent w/ mild nonobstructive CAD.  Marland Kitchen Chronic diastolic CHF (congestive heart failure) (HCC)   . Depression   . Fibromyalgia   . GERD (gastroesophageal reflux disease)   . Hyperlipidemia LDL goal < 70   . Hypertension   . Hypothyroid   . Obesity (BMI 30.0-34.9)     Patient Active Problem List   Diagnosis Date Noted  . Coronary artery disease involving native coronary artery   . Unstable angina (HCC) 10/10/2015  . CAD (coronary artery disease) 01/14/2012  .  Hyperlipidemia 01/14/2012  . Bipolar disorder, curr episode depressed, severe, w/psychotic features (HCC) 01/14/2012  . NSTEMI, initial episode of care [facility]) 01/02/2012    Past Surgical History:  Procedure Laterality Date  . CARDIAC CATHETERIZATION  01/01/2012    WITH CORONARY ANGIOGRAM, left heart   . CARDIAC CATHETERIZATION N/A 10/11/2015   Procedure: Left Heart Cath and Coronary Angiography;  Surgeon: Tonny Bollman, MD;  Location: [facility] INVASIVE CV LAB;  Service: Cardiovascular;  Laterality: N/A;  . CORONARY STENT PLACEMENT    . LEFT HEART CATHETERIZATION WITH CORONARY ANGIOGRAM N/A 01/01/2012   Procedure: LEFT HEART CATHETERIZATION WITH CORONARY ANGIOGRAM;  Surgeon: Iran Ouch, MD;  Location: [facility] CATH LAB;  Service: Cardiovascular;  Laterality: N/A;    Prior to Admission medications   Medication Sig Start Date End Date Taking? Authorizing Provider  aspirin EC 81 MG EC tablet Take 1 tablet (81 mg total) by mouth daily. 10/11/15  Yes Strader, [place] M, PA-C  atorvastatin (LIPITOR) 80 MG tablet Take 80 mg by mouth daily. 08/01/15  Yes [provider]  buPROPion (WELLBUTRIN SR) 150 MG 12 hr tablet Take 150 mg by mouth daily.   Yes [provider]  clonazePAM (KLONOPIN) 0.5 MG tablet Take 0.5 mg by mouth 3 (three) times daily as needed for anxiety.   Yes [provider]  clopidogrel (PLAVIX) 75 MG tablet TAKE 1 TABLET BY MOUTH EVERY DAY 05/04/13  Yes Laurey Morale, MD  gabapentin (NEURONTIN) 300 MG capsule  Take 300 mg by mouth 3 (three) times daily.   Yes [provider]  isosorbide mononitrate (IMDUR) 30 MG 24 hr tablet Take 0.5 tablets (15 mg total) by mouth daily. 10/12/15  Yes Strader, [place] M, PA-C  LATUDA 60 MG TABS Take 60 mg by mouth daily. 09/30/15  Yes [provider]  levothyroxine (SYNTHROID, LEVOTHROID) 125 MCG tablet Take 125 mcg by mouth daily. 10/09/15  Yes [provider]  lisinopril (PRINIVIL,ZESTRIL) 5 MG tablet  Take 5 mg by mouth daily. 07/20/15  Yes [provider]  omeprazole (PRILOSEC) 40 MG capsule Take 40 mg by mouth daily. 08/30/15  Yes [provider]  predniSONE (DELTASONE) 20 MG tablet Take 20 mg by mouth daily with breakfast.   Yes [provider]  ranitidine (ZANTAC) 300 MG tablet Take 1 tablet (300 mg total) by mouth at bedtime. 02/05/12  Yes Laurey Morale, MD  zolpidem (AMBIEN) 10 MG tablet Take 10 mg by mouth at bedtime as needed. for sleep 10/01/15  Yes [provider]  ALPRAZolam Prudy Feeler) 1 MG tablet Take 1 mg by mouth 3 (three) times daily. 09/28/15   [provider]  cyclobenzaprine (FLEXERIL) 10 MG tablet Take 10 mg by mouth 3 (three) times daily. 09/30/15   [provider]  DULoxetine (CYMBALTA) 60 MG capsule Take 60 mg by mouth daily. 07/20/15   [provider]  metoprolol succinate (TOPROL-XL) 25 MG 24 hr tablet Take 25 mg by mouth daily. 07/20/15   [provider]  nitroGLYCERIN (NITROSTAT) 0.4 MG SL tablet Place 1 tablet (0.4 mg total) under the tongue every 5 (five) minutes as needed for chest pain. 01/02/12 01/01/13  Barrett, Joline Salt, PA-C  pramipexole (MIRAPEX) 0.125 MG tablet Take 0.125 mg by mouth at bedtime as needed (restless legs).  08/08/15   [provider]    Allergies Patient has no known allergies.  Family History  Problem Relation Age of Onset  . Heart attack Mother   . Heart attack Father     Social History Social History  Substance Use Topics  . Smoking status: Current Every Day Smoker    Packs/day: 1.00    Years: 25.00    Types: Cigarettes  . Smokeless tobacco: Never Used     Comment: stop june 2013  . Alcohol use No    Review of Systems Constitutional: No fever/chills Eyes: No visual changes. ENT: No sore throat. Cardiovascular: Denies chest pain. Respiratory: Denies shortness of breath.See history of present illness regarding a recent cough about a month ago that is  improved. Gastrointestinal: No abdominal pain.  No nausea, no vomiting.  No diarrhea.  No constipation. Genitourinary: Negative for dysuria. Musculoskeletal: Negative for back pain. Skin: Negative for rash. Neurological: Negative for headaches, focal weakness or numbness.  Denies hallucinations. Denies there harm anyone else  10-point ROS otherwise negative.  ____________________________________________   PHYSICAL EXAM:  VITAL SIGNS: ED Triage Vitals  Enc Vitals Group     BP 11/23/16 0919 126/72     Pulse Rate 11/23/16 0919 87     Resp 11/23/16 0919 (!) 22     Temp 11/23/16 0919 98.1 F (36.7 C)     Temp Source 11/23/16 0919 Oral     SpO2 11/23/16 0919 98 %     Weight 11/23/16 0920 190 lb (86.2 kg)     Height 11/23/16 0920 5\' 3"  (1.6 m)     Head Circumference --      Peak Flow --  Pain Score 11/23/16 0932 6     Pain Loc --      Pain Edu? --      Excl. in GC? --     Constitutional: Alert and oriented. Well appearing and in no acute distress. Appears somewhat anxious and slightly tearful. She began surprise she talks about her grandchildren who she can no longer see because the father "took them away" and will no longer let her or her daughter be with them. Eyes: Conjunctivae are normal. PERRL. EOMI. Head: Atraumatic. Nose: No congestion/rhinnorhea. Mouth/Throat: Mucous membranes are moist.  Oropharynx non-erythematous. Neck: No stridor.   Cardiovascular: Normal rate, regular rhythm. Grossly normal heart sounds.  Good peripheral circulation. Respiratory: Normal respiratory effort.  No retractions. Lungs CTAB. Gastrointestinal: Soft and nontender. No distention.  Musculoskeletal: No lower extremity tenderness nor edema.  No joint effusions. Neurologic:  Normal speech and language. No gross focal neurologic deficits are appreciated.  Skin:  Skin is warm, dry and intact. No rash noted. Psychiatric: Mood and affect are slightly anxious and somewhat  sad.  ____________________________________________   LABS (all labs ordered are listed, but only abnormal results are displayed)  Labs Reviewed  COMPREHENSIVE METABOLIC PANEL - Abnormal; Notable for the following:       Result Value   Sodium 134 (*)    Glucose, Bld 148 (*)    Calcium 8.8 (*)    All other components within normal limits  ACETAMINOPHEN LEVEL - Abnormal; Notable for the following:    Acetaminophen (Tylenol), Serum <10 (*)    All other components within normal limits  CBC - Abnormal; Notable for the following:    WBC 15.6 (*)    Hemoglobin 11.7 (*)    RDW 16.4 (*)    Platelets 477 (*)    All other components within normal limits  URINE DRUG SCREEN, QUALITATIVE ([facility] ONLY) - Abnormal; Notable for the following:    Tricyclic, Ur Screen POSITIVE (*)    Benzodiazepine, Ur Scrn POSITIVE (*)    All other components within normal limits  URINALYSIS, COMPLETE (UACMP) WITH MICROSCOPIC - Abnormal; Notable for the following:    Color, Urine YELLOW (*)    APPearance CLEAR (*)    Bacteria, UA RARE (*)    Squamous Epithelial / LPF 0-5 (*)    All other components within normal limits  URINE CULTURE  ETHANOL  SALICYLATE LEVEL   ____________________________________________  EKG   ____________________________________________  RADIOLOGY   ____________________________________________   PROCEDURES  Procedure(s) performed: None  Procedures  Critical Care performed: No  ____________________________________________   INITIAL IMPRESSION / ASSESSMENT AND PLAN / ED COURSE  Pertinent labs & imaging results that were available during my care of the patient were reviewed by me and considered in my medical decision making (see chart for details).  Patient denies any acute medical symptoms.  She reports symptoms of depression, anxiety and multiple stressors around her grandchildren. She has passive thoughts of suicide but denies she would harm herself and she is  agreeable to staying voluntarily to see a psychiatrist here today.  White count slightly elevated, the patient denies any infectious symptomatology.    ----------------------------------------- 2:59 PM on 11/23/2016 -----------------------------------------  Spoke with psychiatry, they're recommending inpatient evaluation for severe depression. Patient is agreeable with this. Remains voluntary at this time. Patient medically clear for further care and the BHU.  ____________________________________________   FINAL CLINICAL IMPRESSION(S) / ED DIAGNOSES  Final diagnoses:  Current severe episode of major depressive disorder without psychotic features without  prior episode [facility](HCC)      NEW MEDICATIONS STARTED DURING THIS VISIT:  New Prescriptions   No medications on file     Note:  This document was prepared using Dragon voice recognition software and may include unintentional dictation errors.     Sharyn CreamerQuale, Bethenny Losee, MD 11/23/16 1500

## 2016-11-23 NOTE — ED Notes (Signed)
Patient stating, "I don't want to be here". EDP notified

## 2016-11-23 NOTE — ED Notes (Signed)
[facility]. Pt wanded and oriented to unit. Pt teary and reports worsening depression for the past couple months due to grandchildren being taken from daughter. Pt denies SI/HI and A/V hallucinations. Pt offered fluids. Pt remains safe with 15 minute checks

## 2016-11-23 NOTE — ED Provider Notes (Signed)
 -----------------------------------------   6:26 PM on 11/23/2016 -----------------------------------------  Patient insisting on leaving and feeling very anxious. I discussed with the psychiatrist evaluated earlier who feels like she is not safe to go home and recommends involuntary commitment if she is no longer willing to stay voluntarily. I initiated the paperwork. I have updated the patient who is willing to comply. We'll continue to manage her anxiety as best we can, I'll offer her additional oral Xanax, and other benzodiazepines for anxiolysis.   Sharman CheekStafford, Ericah Scotto, MD 11/23/16 [phone]

## 2016-11-23 NOTE — ED Notes (Signed)
Dinner brought to patient 

## 2016-11-23 NOTE — ED Notes (Signed)
Pt notified about the increase in medication for anxiety. Pt reports that she will give it a try

## 2016-11-23 NOTE — Progress Notes (Signed)
D: Pt denies SI/HI/AVH. Pt is pleasant and cooperative. Pt is visibly anxious and paranoid about being in the BHU. Pt stated she felt closed in. Pt stated she did not feel safe.   A: Pt was offered support and encouragement. Pt was given scheduled medications. Pt given 1:1 time talking about current situation and her safety.    R: Pt is taking medication. Pt has no complaints.Pt receptive to treatment and safety maintained on unit.

## 2016-11-23 NOTE — ED Notes (Signed)
Called lab per Dr. Lorenza ChickQuale's request to inquire as to progress of UA. Lab states insufficient quantity. Test reordered and second sample collected.

## 2016-11-24 ENCOUNTER — Emergency Department: Payer: Medicare Other

## 2016-11-24 DIAGNOSIS — F322 Major depressive disorder, single episode, severe without psychotic features: Secondary | ICD-10-CM | POA: Diagnosis not present

## 2016-11-24 LAB — URINE CULTURE: SPECIAL REQUESTS: NORMAL

## 2016-11-24 MED ORDER — PRAMIPEXOLE DIHYDROCHLORIDE 0.125 MG PO TABS: 0.1250 mg | ORAL_TABLET | Freq: Every evening | ORAL | Status: DC | PRN

## 2016-11-24 MED ORDER — LORAZEPAM 1 MG PO TABS: 1.0000 mg | ORAL_TABLET | Freq: Four times a day (QID) | ORAL | Status: DC | PRN

## 2016-11-24 MED ORDER — DULOXETINE HCL 60 MG PO CPEP
60.0000 mg | ORAL_CAPSULE | Freq: Every day | ORAL | Status: DC
  Administered 2016-11-24: 60 mg via ORAL
  Filled 2016-11-24: qty 1

## 2016-11-24 MED ORDER — LORAZEPAM 1 MG PO TABS
ORAL_TABLET | ORAL | Status: AC
  Filled 2016-11-24: qty 1

## 2016-11-24 MED ORDER — HALOPERIDOL 0.5 MG PO TABS
1.0000 mg | ORAL_TABLET | Freq: Once | ORAL | Status: AC
  Administered 2016-11-24: 1 mg via ORAL

## 2016-11-24 MED ORDER — HALOPERIDOL 0.5 MG PO TABS
ORAL_TABLET | ORAL | Status: AC
  Filled 2016-11-24: qty 2

## 2016-11-24 MED ORDER — ALPRAZOLAM 0.5 MG PO TABS
1.0000 mg | ORAL_TABLET | Freq: Three times a day (TID) | ORAL | Status: DC
  Administered 2016-11-24: 1 mg via ORAL
  Filled 2016-11-24: qty 2

## 2016-11-24 MED ORDER — LORAZEPAM 1 MG PO TABS
1.0000 mg | ORAL_TABLET | Freq: Once | ORAL | Status: AC
  Administered 2016-11-24: 1 mg via ORAL

## 2016-11-24 NOTE — ED Provider Notes (Signed)
-----------------------------------------   4:14 AM on 11/24/2016 -----------------------------------------   Blood pressure 134/89, pulse 74, temperature 97.8 F (36.6 C), temperature source Oral, resp. rate 16, height 5\' 3"  (1.6 m), weight 190 lb (86.2 kg), SpO2 100 %.  The patient had no acute events since last update.  Calm and cooperative at this time.  Disposition is pending Psychiatry/Behavioral Medicine team recommendations.     Merrily Brittleifenbark, Rondale Nies, MD 11/24/16 [phone]

## 2016-11-24 NOTE — ED Notes (Signed)
Patient resting quietly in room. No noted distress or abnormal behaviors noted. Will continue 15 minute checks and observation by security camera for safety. 

## 2016-11-24 NOTE — ED Notes (Signed)
Pt complaining of high levels of anxiety, shaking. "I can't stay back here much longer. If I had known it would be like this I never would have come."  Pt administered PRN anxiety medication at 0640, does not appear to have calmed patient.  Pt is tearful, restless. RN will notify EDP. Maintained on 15 minute checks and observation by security camera for safety.

## 2016-11-24 NOTE — ED Provider Notes (Signed)
ED ECG REPORT I, Willy EddyPatrick Hanson Medeiros, the attending physician, personally viewed and interpreted this ECG.   Date: 11/24/2016  EKG Time: 12L37  Rate: 110  Rhythm: sinus  Axis: normal   Intervals:normal intervals  ST&T Change: non specific st change    Willy Eddyobinson, Mistie Adney, MD 11/24/16 1243

## 2016-11-24 NOTE — Progress Notes (Signed)
   [facility]([phone]),Called and left message awaiting call back    [phone]),Spoke to [place] patient is under review ( follow up Monday) Usually accepts patient 55+   [facility]([phone]), [facility] try again on Monday   [phone]), As per Jabel No beds   [facility] [phone]) As per Selena BattenKim only accepts patient (5-17 or 50+) No beds   [facility]([phone]), [facility]    [facility] [phone]), They have some beds patients under review as per West[place] PamLacy   [facility]([phone]).Left a message awaiting a call back   [facility] per [facility]  [facility]   Delta Air LinesClaudine Dortha Neighbors LCSW [phone]

## 2016-11-24 NOTE — ED Notes (Signed)
Pt informed her [facility] was here [facility]. Pt accepting. Belongings will be  sent with officer. Pt will travel in scrubs. Maintained on 15 minute checks and observation by security camera for safety.

## 2016-11-24 NOTE — Progress Notes (Signed)
LCSW received a [facility] from [facility] and she has offered a bed for Ms Mayford Knifeurner  Accepting Physician is Estill Cot[place]omas [place] and Call report number is [phone]  Sp[facility] to [facility]  ED Secretary. Patient is currently getting tests and will be ready for transport by sherrif in 1-2 hours.  Delta Air LinesClaudine Delonda Coley LCSW [phone]

## 2016-11-24 NOTE — ED Notes (Signed)
MD speaking with husband

## 2016-11-24 NOTE — ED Notes (Addendum)
Pt visiting with her husband. Pt and husband told [facility].  Pt and husband not happy with this news. RN offered emotional support and encouragement.  Maintained on 15 minute checks and observation by security camera for safety.

## 2016-11-24 NOTE — ED Notes (Signed)
Pt IVC. [facility]. Belongings sent with officer.

## 2016-11-24 NOTE — ED Notes (Signed)
Patient asleep in room. No noted distress or abnormal behavior. Will continue 15 minute checks and observation by security cameras for safety. 

## 2016-11-24 NOTE — ED Provider Notes (Signed)
Patient examined and remains hemodynamically stable condition prior to [facility].   Willy Eddyobinson, Keiton Cosma, MD 11/24/16 [phone]

## 2016-11-24 NOTE — Progress Notes (Signed)
Referral information for Psychiatric Hospitalization faxed to;     [facility] [phone] or [phone]   [facility] [phone]),    [facility] [phone]),    [facility] [phone]),    [address] [phone]),    [facility] [phone]   [facility] [phone]),    [facility] [phone]),    [facility] [phone]),    [place] [place] [phone]).  Patient information was faxed out to the following facilities at 8:15 am. LCSW to call and follow up on bed availablility   Rushawn Capshaw [place] LCSW [phone]

## 2016-11-24 NOTE — ED Notes (Signed)
Pt's anxiety level is high. Pt is continuing to yell that she wants to go home.  Pt pacing from her room to the dayroom. Stated she is frightened by other patients on the unit. RN offered emotional support and encouragement.

## 2016-11-29 ENCOUNTER — Other Ambulatory Visit: Payer: Self-pay | Admitting: Student

## 2017-03-03 ENCOUNTER — Other Ambulatory Visit: Payer: Self-pay | Admitting: Student

## 2018-04-22 ENCOUNTER — Other Ambulatory Visit: Payer: Self-pay | Admitting: Student

## 2018-05-20 ENCOUNTER — Other Ambulatory Visit: Payer: Self-pay | Admitting: Student

## 2019-01-30 ENCOUNTER — Other Ambulatory Visit: Payer: Self-pay

## 2019-01-30 ENCOUNTER — Encounter (HOSPITAL_COMMUNITY): Payer: Self-pay

## 2019-01-30 ENCOUNTER — Inpatient Hospital Stay (HOSPITAL_COMMUNITY)
Admission: EM | Admit: 2019-01-30 | Discharge: 2019-02-02 | DRG: 193 | Disposition: A | Discharge status: Home / Self Care | Payer: Medicare HMO | Attending: Internal Medicine | Admitting: Internal Medicine

## 2019-01-30 ENCOUNTER — Emergency Department (HOSPITAL_COMMUNITY): Payer: Medicare HMO

## 2019-01-30 DIAGNOSIS — Z20828 Contact with and (suspected) exposure to other viral communicable diseases: Secondary | ICD-10-CM | POA: Diagnosis present

## 2019-01-30 DIAGNOSIS — R0602 Shortness of breath: Secondary | ICD-10-CM

## 2019-01-30 DIAGNOSIS — I2510 Atherosclerotic heart disease of native coronary artery without angina pectoris: Secondary | ICD-10-CM | POA: Diagnosis present

## 2019-01-30 DIAGNOSIS — J189 Pneumonia, unspecified organism: Secondary | ICD-10-CM | POA: Diagnosis not present

## 2019-01-30 DIAGNOSIS — Z79890 Hormone replacement therapy (postmenopausal): Secondary | ICD-10-CM

## 2019-01-30 DIAGNOSIS — E876 Hypokalemia: Secondary | ICD-10-CM | POA: Diagnosis present

## 2019-01-30 DIAGNOSIS — M549 Dorsalgia, unspecified: Secondary | ICD-10-CM | POA: Diagnosis present

## 2019-01-30 DIAGNOSIS — W19XXXD Unspecified fall, subsequent encounter: Secondary | ICD-10-CM | POA: Diagnosis present

## 2019-01-30 DIAGNOSIS — J690 Pneumonitis due to inhalation of food and vomit: Secondary | ICD-10-CM | POA: Diagnosis present

## 2019-01-30 DIAGNOSIS — E785 Hyperlipidemia, unspecified: Secondary | ICD-10-CM | POA: Diagnosis present

## 2019-01-30 DIAGNOSIS — G8929 Other chronic pain: Secondary | ICD-10-CM | POA: Diagnosis present

## 2019-01-30 DIAGNOSIS — Z7952 Long term (current) use of systemic steroids: Secondary | ICD-10-CM

## 2019-01-30 DIAGNOSIS — J9601 Acute respiratory failure with hypoxia: Secondary | ICD-10-CM | POA: Diagnosis present

## 2019-01-30 DIAGNOSIS — F315 Bipolar disorder, current episode depressed, severe, with psychotic features: Secondary | ICD-10-CM | POA: Diagnosis present

## 2019-01-30 DIAGNOSIS — F319 Bipolar disorder, unspecified: Secondary | ICD-10-CM | POA: Diagnosis present

## 2019-01-30 DIAGNOSIS — Z7902 Long term (current) use of antithrombotics/antiplatelets: Secondary | ICD-10-CM

## 2019-01-30 DIAGNOSIS — S81012D Laceration without foreign body, left knee, subsequent encounter: Secondary | ICD-10-CM

## 2019-01-30 DIAGNOSIS — M797 Fibromyalgia: Secondary | ICD-10-CM | POA: Diagnosis present

## 2019-01-30 DIAGNOSIS — Z7982 Long term (current) use of aspirin: Secondary | ICD-10-CM

## 2019-01-30 DIAGNOSIS — I110 Hypertensive heart disease with heart failure: Secondary | ICD-10-CM | POA: Diagnosis present

## 2019-01-30 DIAGNOSIS — E669 Obesity, unspecified: Secondary | ICD-10-CM | POA: Diagnosis present

## 2019-01-30 DIAGNOSIS — Z6832 Body mass index (BMI) 32.0-32.9, adult: Secondary | ICD-10-CM

## 2019-01-30 DIAGNOSIS — F419 Anxiety disorder, unspecified: Secondary | ICD-10-CM | POA: Diagnosis present

## 2019-01-30 DIAGNOSIS — D649 Anemia, unspecified: Secondary | ICD-10-CM | POA: Diagnosis present

## 2019-01-30 DIAGNOSIS — F17210 Nicotine dependence, cigarettes, uncomplicated: Secondary | ICD-10-CM | POA: Diagnosis present

## 2019-01-30 DIAGNOSIS — G894 Chronic pain syndrome: Secondary | ICD-10-CM | POA: Diagnosis present

## 2019-01-30 DIAGNOSIS — K219 Gastro-esophageal reflux disease without esophagitis: Secondary | ICD-10-CM | POA: Diagnosis present

## 2019-01-30 DIAGNOSIS — E039 Hypothyroidism, unspecified: Secondary | ICD-10-CM | POA: Diagnosis present

## 2019-01-30 DIAGNOSIS — I5032 Chronic diastolic (congestive) heart failure: Secondary | ICD-10-CM | POA: Diagnosis present

## 2019-01-30 DIAGNOSIS — I252 Old myocardial infarction: Secondary | ICD-10-CM

## 2019-01-30 DIAGNOSIS — J441 Chronic obstructive pulmonary disease with (acute) exacerbation: Secondary | ICD-10-CM | POA: Diagnosis present

## 2019-01-30 DIAGNOSIS — J440 Chronic obstructive pulmonary disease with acute lower respiratory infection: Secondary | ICD-10-CM | POA: Diagnosis present

## 2019-01-30 DIAGNOSIS — Z8249 Family history of ischemic heart disease and other diseases of the circulatory system: Secondary | ICD-10-CM

## 2019-01-30 LAB — POCT I-STAT EG7
Acid-base deficit: 5 mmol/L — ABNORMAL HIGH (ref 0.0–2.0)
Bicarbonate: 20.7 mmol/L (ref 20.0–28.0)
Calcium, Ion: 1.23 mmol/L (ref 1.15–1.40)
HCT: 28 % — ABNORMAL LOW (ref 36.0–46.0)
Hemoglobin: 9.5 g/dL — ABNORMAL LOW (ref 12.0–15.0)
O2 Saturation: 75 %
Potassium: 3.3 mmol/L — ABNORMAL LOW (ref 3.5–5.1)
Sodium: 141 mmol/L (ref 135–145)
TCO2: 22 mmol/L (ref 22–32)
pCO2, Ven: 38.7 mmHg — ABNORMAL LOW (ref 44.0–60.0)
pH, Ven: 7.337 (ref 7.250–7.430)
pO2, Ven: 42 mmHg (ref 32.0–45.0)

## 2019-01-30 LAB — CBC WITH DIFFERENTIAL/PLATELET
Abs Immature Granulocytes: 0.15 10*3/uL — ABNORMAL HIGH (ref 0.00–0.07)
Basophils Absolute: 0 10*3/uL (ref 0.0–0.1)
Basophils Relative: 0 %
Eosinophils Absolute: 0.1 10*3/uL (ref 0.0–0.5)
Eosinophils Relative: 1 %
HCT: 29.5 % — ABNORMAL LOW (ref 36.0–46.0)
Hemoglobin: 9 g/dL — ABNORMAL LOW (ref 12.0–15.0)
Immature Granulocytes: 1 %
Lymphocytes Relative: 7 %
Lymphs Abs: 1.1 10*3/uL (ref 0.7–4.0)
MCH: 26.6 pg (ref 26.0–34.0)
MCHC: 30.5 g/dL (ref 30.0–36.0)
MCV: 87.3 fL (ref 80.0–100.0)
Monocytes Absolute: 0.6 10*3/uL (ref 0.1–1.0)
Monocytes Relative: 4 %
Neutro Abs: 13.7 10*3/uL — ABNORMAL HIGH (ref 1.7–7.7)
Neutrophils Relative %: 87 %
Platelets: 418 10*3/uL — ABNORMAL HIGH (ref 150–400)
RBC: 3.38 MIL/uL — ABNORMAL LOW (ref 3.87–5.11)
RDW: 15.5 % (ref 11.5–15.5)
WBC: 15.6 10*3/uL — ABNORMAL HIGH (ref 4.0–10.5)
nRBC: 0 % (ref 0.0–0.2)

## 2019-01-30 MED ORDER — ALBUTEROL SULFATE HFA 108 (90 BASE) MCG/ACT IN AERS: 2.0000 | INHALATION_SPRAY | Freq: Once | RESPIRATORY_TRACT | Status: DC

## 2019-01-30 MED ORDER — ALBUTEROL SULFATE HFA 108 (90 BASE) MCG/ACT IN AERS
8.0000 | INHALATION_SPRAY | Freq: Once | RESPIRATORY_TRACT | Status: AC
  Administered 2019-01-30: 8 via RESPIRATORY_TRACT
  Filled 2019-01-30: qty 6.7

## 2019-01-30 NOTE — ED Notes (Signed)
Urine culture sent with urinalysis.  

## 2019-01-30 NOTE — ED Provider Notes (Signed)
[facility] Provider Note   CSN: [id number] Arrival date & time: 01/30/19  2213     History   Chief Complaint Chief Complaint  Patient presents with  . Shortness of Breath    HPI Erin Lynch is a 58 y.o. female.     HPI  Patient is a 58 year old female with past medical history significant for recently diagnosed COPD, CAD, osteoarthritis, chronic back pain, diastolic heart failure, HLD, anxiety disorder, fibromyalgia, current everyday smoker who presents to the emergency department today for shortness of breath, wheezing and 4-day cough.  She denies any fever or chills.  She denies any chest pain or palpitations.  She denies any nausea, vomiting, diarrhea.  She has not had any known exposure to anyone who has tested positive for COVID-19.  She recently completed a course of antibiotics for a UTI.  She arrived by EMS [facility]([place]) from home.  EMS treatment PTA included supplemental oxygen by [place] at 2 L/min, DuoNeb, Solu-Medrol.  Past Medical History:  Diagnosis Date  . Anxiety   . Arthritis   . Bulging disc   . CAD (coronary artery disease)    a. NSTEMI 12/2011: BMS-LAD b. 09/2015: cath showing patent stent w/ mild nonobstructive CAD.  Marland Kitchen. Chronic diastolic CHF (congestive heart failure) (HCC)   . Depression   . Fibromyalgia   . GERD (gastroesophageal reflux disease)   . Hyperlipidemia LDL goal < 70   . Hypertension   . Hypothyroid   . Obesity (BMI 30.0-34.9)     Patient Active Problem List   Diagnosis Date Noted  . Coronary artery disease involving native coronary artery   . Unstable angina (HCC) 10/10/2015  . CAD (coronary artery disease) 01/14/2012  . Hyperlipidemia 01/14/2012  . Bipolar disorder, curr episode depressed, severe, w/psychotic features (HCC) 01/14/2012  . NSTEMI, initial episode of care [facility](HCC) 01/02/2012    Past Surgical History:  Procedure Laterality Date  . CARDIAC CATHETERIZATION  01/01/2012    WITH  CORONARY ANGIOGRAM, left heart   . CARDIAC CATHETERIZATION N/A 10/11/2015   Procedure: Left Heart Cath and Coronary Angiography;  Surgeon: Tonny BollmanMichael Cooper, MD;  [facility];  Service: Cardiovascular;  Laterality: N/A;  . CORONARY STENT PLACEMENT    . LEFT HEART CATHETERIZATION WITH CORONARY ANGIOGRAM N/A 01/01/2012   Procedure: LEFT HEART CATHETERIZATION WITH CORONARY ANGIOGRAM;  Surgeon: Iran OuchMuhammad A Arida, MD;  Location: [facility] CATH LAB;  Service: Cardiovascular;  Laterality: N/A;     OB History   No obstetric history on file.      Home Medications    Prior to Admission medications   Medication Sig Start Date End Date Taking? Authorizing Provider  ALPRAZolam Prudy Feeler(XANAX) 1 MG tablet Take 1 mg by mouth 3 (three) times daily. 09/28/15   [provider]  aspirin EC 81 MG EC tablet Take 1 tablet (81 mg total) by mouth daily. 10/11/15   Strader, Lennart PallBrittany M, PA-C  atorvastatin (LIPITOR) 80 MG tablet Take 80 mg by mouth daily. 08/01/15   [provider]  buPROPion (WELLBUTRIN SR) 150 MG 12 hr tablet Take 150 mg by mouth daily.    [provider]  clonazePAM (KLONOPIN) 0.5 MG tablet Take 0.5 mg by mouth 3 (three) times daily as needed for anxiety.    [provider]  clopidogrel (PLAVIX) 75 MG tablet TAKE 1 TABLET BY MOUTH EVERY DAY 05/04/13   Laurey MoraleMcLean, Dalton S, MD  cyclobenzaprine (FLEXERIL) 10 MG tablet Take 10 mg by mouth  3 (three) times daily. 09/30/15   [provider]  DULoxetine (CYMBALTA) 60 MG capsule Take 60 mg by mouth daily. 07/20/15   [provider]  gabapentin (NEURONTIN) 300 MG capsule Take 300 mg by mouth 3 (three) times daily.    [provider]  isosorbide mononitrate (IMDUR) 30 MG 24 hr tablet TAKE 0.5 TABLETS (15 MG TOTAL) BY MOUTH DAILY. 03/03/17   Strader, Lennart PallBrittany M, PA-C  LATUDA 60 MG TABS Take 60 mg by mouth daily. 09/30/15   [provider]  levothyroxine (SYNTHROID, LEVOTHROID) 125 MCG tablet Take 125  mcg by mouth daily. 10/09/15   [provider]  lisinopril (PRINIVIL,ZESTRIL) 5 MG tablet Take 5 mg by mouth daily. 07/20/15   [provider]  metoprolol succinate (TOPROL-XL) 25 MG 24 hr tablet Take 25 mg by mouth daily. 07/20/15   [provider]  nitroGLYCERIN (NITROSTAT) 0.4 MG SL tablet Place 1 tablet (0.4 mg total) under the tongue every 5 (five) minutes as needed for chest pain. 01/02/12 01/01/13  Barrett, Joline Salthonda G, PA-C  omeprazole (PRILOSEC) 40 MG capsule Take 40 mg by mouth daily. 08/30/15   [provider]  pramipexole (MIRAPEX) 0.125 MG tablet Take 0.125 mg by mouth at bedtime as needed (restless legs).  08/08/15   [provider]  predniSONE (DELTASONE) 20 MG tablet Take 20 mg by mouth daily with breakfast.    [provider]  ranitidine (ZANTAC) 300 MG tablet Take 1 tablet (300 mg total) by mouth at bedtime. 02/05/12   Laurey MoraleMcLean, Dalton S, MD  zolpidem (AMBIEN) 10 MG tablet Take 10 mg by mouth at bedtime as needed. for sleep 10/01/15   [provider]    Family History Family History  Problem Relation Age of Onset  . Heart attack Mother   . Heart attack Father     Social History Social History   Tobacco Use  . Smoking status: Current Every Day Smoker    Packs/day: 1.00    Years: 25.00    Pack years: 25.00    Types: Cigarettes  . Smokeless tobacco: Never Used  . Tobacco comment: stop june 2013  Substance Use Topics  . Alcohol use: No  . Drug use: No     Allergies   Patient has no known allergies.   Review of Systems Review of Systems ROS negative except for HPI as above  Physical Exam ED Triage Vitals  Enc Vitals Group     BP 01/30/19 2215 128/63     Pulse Rate 01/30/19 2215 78     Resp 01/30/19 2215 14     Temp 01/30/19 2220 (!) 97.5 F (36.4 C)     Temp Source 01/30/19 2220 Oral     SpO2 01/30/19 2215 97 %     Updated Vital Signs BP 128/63 (BP Location: Left Arm)   Pulse 74   Temp (!) 97.5 F  (36.4 C) (Oral)   Resp (!) 22   Physical Exam Vitals signs and nursing note reviewed.  Constitutional:      General: She is not in acute distress.    Appearance: She is overweight. She is ill-appearing.     Comments: Appears older than stated age.  Appears chronically ill.  HENT:     Head: Normocephalic and atraumatic.     Mouth/Throat:     Mouth: Mucous membranes are moist.  Eyes:     Extraocular Movements: Extraocular movements intact.     Conjunctiva/sclera: Conjunctivae normal.  Neck:  Musculoskeletal: Neck supple.     Vascular: No JVD.  Cardiovascular:     Rate and Rhythm: Normal rate and regular rhythm.     Pulses: No decreased pulses.     Heart sounds: No murmur.  Pulmonary:     Effort: Tachypnea present. No accessory muscle usage.     Breath sounds: Examination of the left-upper field reveals rhonchi. Examination of the left-middle field reveals rhonchi. Examination of the left-lower field reveals rhonchi. Decreased breath sounds (throughout), wheezing (marked expiratory wheezes) and rhonchi present.  Abdominal:     Palpations: Abdomen is soft.     Tenderness: There is no abdominal tenderness.  Musculoskeletal: Normal range of motion.  Skin:    General: Skin is warm and dry.  Neurological:     General: No focal deficit present.     Mental Status: She is alert and oriented to person, place, and time.      ED Treatments / Results  Labs (all labs ordered are listed, but only abnormal results are displayed) Labs Reviewed  COMPREHENSIVE METABOLIC PANEL - Abnormal; Notable for the following components:      Result Value   Potassium 3.3 (*)    CO2 20 (*)    Glucose, Bld 113 (*)    Calcium 8.7 (*)    Total Protein 5.9 (*)    Albumin 2.7 (*)    AST 14 (*)    Total Bilirubin 0.2 (*)    All other components within normal limits  BRAIN NATRIURETIC PEPTIDE - Abnormal; Notable for the following components:   B Natriuretic Peptide 190.6 (*)    All other components  within normal limits  CBC WITH DIFFERENTIAL/PLATELET - Abnormal; Notable for the following components:   WBC 15.6 (*)    RBC 3.38 (*)    Hemoglobin 9.0 (*)    HCT 29.5 (*)    Platelets 418 (*)    Neutro Abs 13.7 (*)    Abs Immature Granulocytes 0.15 (*)    All other components within normal limits  URINALYSIS, ROUTINE W REFLEX MICROSCOPIC - Abnormal; Notable for the following components:   Leukocytes,Ua MODERATE (*)    Bacteria, UA FEW (*)    All other components within normal limits  POCT I-STAT EG7 - Abnormal; Notable for the following components:   pCO2, Ven 38.7 (*)    Acid-base deficit 5.0 (*)    Potassium 3.3 (*)    HCT 28.0 (*)    Hemoglobin 9.5 (*)    All other components within normal limits  TROPONIN I (HIGH SENSITIVITY) - Abnormal; Notable for the following components:   Troponin I (High Sensitivity) 33 (*)    All other components within normal limits  SARS CORONAVIRUS 2 (HOSPITAL ORDER, [place] [facility])  I-STAT VENOUS BLOOD GAS, ED  TROPONIN I (HIGH SENSITIVITY)    EKG EKG Interpretation  Date/Time:  Saturday January 30 2019 22:14:51 EDT Ventricular Rate:  77 PR Interval:    QRS Duration: 102 QT Interval:  457 QTC Calculation: 518 R Axis:   14 Text Interpretation:  Sinus rhythm Prolonged QT interval Interpretation limited secondary to artifact Confirmed by Ripley Fraise [phone]) on 01/30/2019 11:57:22 PM   Radiology Dg Chest Portable 1 View  Result Date: 01/30/2019 CLINICAL DATA:  58 year old female with shortness of breath. EXAM: PORTABLE CHEST 1 VIEW COMPARISON:  Chest radiograph dated 12/25/2018 FINDINGS: Left lung base hazy density may represent combination of small pleural effusion and associated atelectasis versus an area of developing infiltrate.  Clinical correlation is recommended. The right lung is clear. There is no pneumothorax. The cardiac silhouette is within normal limits. No acute osseous pathology. IMPRESSION: Small left  pleural effusion and left lung base atelectasis versus developing infiltrate. Electronically Signed   By: Elgie Collard M.D.   On: 01/30/2019 23:10    Procedures Procedures (including critical care time)  Medications Ordered in ED Medications  albuterol (VENTOLIN HFA) 108 (90 Base) MCG/ACT inhaler 8 puff (8 puffs Inhalation Given 01/30/19 2341)  cefTRIAXone (ROCEPHIN) 1 g in sodium chloride 0.9 % 100 mL IVPB (0 g Intravenous Stopped 01/31/19 0217)  azithromycin (ZITHROMAX) 500 mg in sodium chloride 0.9 % 250 mL IVPB (500 mg Intravenous New Bag/Given 01/31/19 0140)  albuterol (VENTOLIN HFA) 108 (90 Base) MCG/ACT inhaler 4 puff (4 puffs Inhalation Given 01/31/19 0144)  oxyCODONE-acetaminophen (PERCOCET/ROXICET) 5-325 MG per tablet 2 tablet (2 tablets Oral Given 01/31/19 0159)     Initial Impression / Assessment and Plan / ED Course  I have reviewed the triage vital signs and the nursing notes.  Pertinent labs & imaging results that were available during my care of the patient were reviewed by me and considered in my medical decision making (see chart for details).  Of note, this patient was evaluated in the Emergency Department for the symptoms described in the history of present illness. She was evaluated in the context of the global COVID-19 pandemic, which necessitated consideration that the patient might be at risk for infection with the SARS-CoV-2 virus that causes COVID-19. Institutional protocols and algorithms that pertain to the evaluation of patients at risk for COVID-19 are in a state of rapid change based on information released by regulatory bodies including the CDC and federal and state organizations. These policies and algorithms were followed during the patient's care in the ED.  During this patient encounter, the patient was wearing a mask, and throughout this encounter I was wearing at least a surgical mask.  I was not within 6 feet of this patient for more than 15 minutes without  eye protection when they were not wearing mask.   Differentials considered: Community-acquired pneumonia, COPD exacerbation, acute heart failure exacerbation, pleural effusions, COVID-19  EM Physician interpretation of Labs & Imaging: . CBC with neutrophilic leukocytosis . Metabolic panel with mild hypokalemia, decreased bicarb with CO2 of 20, hypoalbuminemia . Urinalysis with moderate leukocytes and few bacteria . Elevated serum BNP of 190 . Reassuring venous i-STAT blood gas, with pH 7.3, PCO2 38.7, PO2 42, bicarb 20.7 . Initial serum troponin 11  Medical Decision Making:  Virna Livengood is a 58 y.o. female with the above past medical history, significant for recent COPD diagnosis and current daily tobacco smoker who presented to the emergency department today with a cough x4 days, progressive shortness of breath.  She arrived afebrile, hemodynamically stable, though was tachypneic with increased work of breathing and significantly diminished lung sounds throughout with expiratory wheezes and diminished tidal volume.  Chest x-ray showed a small left pleural effusion and opacity in the left lung base representative of atelectasis versus developing infiltrate.  Chest x-ray findings in combination with the patient's history and physical examination are more concerning for acute/developing infiltrate.  COVID-19 test pending.  She was given 8 puffs of albuterol inhaler with marked subjective and objective improvement in ventilatory effort and air movement.  I doubt ACS or heart failure exacerbation.  Clinical picture most consistent with COPD exacerbation with pneumonia.  She was given IV Rocephin and azithromycin and will be  admitted to the [facility] service.   The plan for this patient was discussed with my attending physician, Dr. Blane OharaJoshua Zavitz, who voiced agreement and who oversaw evaluation and treatment of this patient.   CLINICAL IMPRESSION: 1. Shortness of breath   2. Community  acquired pneumonia of left lower lobe of lung (HCC)      Disposition: Admit  Genella Bas A. Mayford KnifeWilliams, MD Resident Physician, PGY-3 Emergency Medicine [facility]   Saverio DankerWilliams, Kassadi Presswood A, MD 01/31/19 [id number]    Blane OharaZavitz, Joshua, MD 01/31/19 2342

## 2019-01-30 NOTE — ED Notes (Signed)
Pt has been placed on a purewick. 

## 2019-01-30 NOTE — ED Triage Notes (Signed)
Pt arrives via [place] ems with SOB. Pt recently diagnoses with COPD. Bilateral wheezing lung sounds. Pt placed on 2L O2 via [place] with SPO2 96%. Pt with new onset of couch a few days ago, denies CP, and/or fever.  Approximately a week ago pt had a fall that required 14 stitches in her left leg. At that time pt tested negavite for covid.  EMS VS 104/66, HR 72, CBG 160, TEMP 97.57F, RR 24.

## 2019-01-31 DIAGNOSIS — E876 Hypokalemia: Secondary | ICD-10-CM | POA: Diagnosis present

## 2019-01-31 DIAGNOSIS — J189 Pneumonia, unspecified organism: Secondary | ICD-10-CM | POA: Diagnosis present

## 2019-01-31 DIAGNOSIS — Z8249 Family history of ischemic heart disease and other diseases of the circulatory system: Secondary | ICD-10-CM | POA: Diagnosis not present

## 2019-01-31 DIAGNOSIS — Z20828 Contact with and (suspected) exposure to other viral communicable diseases: Secondary | ICD-10-CM | POA: Diagnosis present

## 2019-01-31 DIAGNOSIS — J181 Lobar pneumonia, unspecified organism: Secondary | ICD-10-CM

## 2019-01-31 DIAGNOSIS — F419 Anxiety disorder, unspecified: Secondary | ICD-10-CM | POA: Diagnosis present

## 2019-01-31 DIAGNOSIS — J9601 Acute respiratory failure with hypoxia: Secondary | ICD-10-CM | POA: Diagnosis present

## 2019-01-31 DIAGNOSIS — W19XXXD Unspecified fall, subsequent encounter: Secondary | ICD-10-CM | POA: Diagnosis present

## 2019-01-31 DIAGNOSIS — Z7952 Long term (current) use of systemic steroids: Secondary | ICD-10-CM | POA: Diagnosis not present

## 2019-01-31 DIAGNOSIS — Z7982 Long term (current) use of aspirin: Secondary | ICD-10-CM | POA: Diagnosis not present

## 2019-01-31 DIAGNOSIS — F315 Bipolar disorder, current episode depressed, severe, with psychotic features: Secondary | ICD-10-CM

## 2019-01-31 DIAGNOSIS — R0602 Shortness of breath: Secondary | ICD-10-CM | POA: Diagnosis present

## 2019-01-31 DIAGNOSIS — Z79890 Hormone replacement therapy (postmenopausal): Secondary | ICD-10-CM | POA: Diagnosis not present

## 2019-01-31 DIAGNOSIS — F17210 Nicotine dependence, cigarettes, uncomplicated: Secondary | ICD-10-CM | POA: Diagnosis present

## 2019-01-31 DIAGNOSIS — I2510 Atherosclerotic heart disease of native coronary artery without angina pectoris: Secondary | ICD-10-CM | POA: Diagnosis present

## 2019-01-31 DIAGNOSIS — Z7902 Long term (current) use of antithrombotics/antiplatelets: Secondary | ICD-10-CM | POA: Diagnosis not present

## 2019-01-31 DIAGNOSIS — I110 Hypertensive heart disease with heart failure: Secondary | ICD-10-CM | POA: Diagnosis present

## 2019-01-31 DIAGNOSIS — F319 Bipolar disorder, unspecified: Secondary | ICD-10-CM | POA: Diagnosis present

## 2019-01-31 DIAGNOSIS — G894 Chronic pain syndrome: Secondary | ICD-10-CM | POA: Diagnosis present

## 2019-01-31 DIAGNOSIS — J440 Chronic obstructive pulmonary disease with acute lower respiratory infection: Secondary | ICD-10-CM | POA: Diagnosis present

## 2019-01-31 DIAGNOSIS — J441 Chronic obstructive pulmonary disease with (acute) exacerbation: Secondary | ICD-10-CM | POA: Diagnosis present

## 2019-01-31 DIAGNOSIS — I252 Old myocardial infarction: Secondary | ICD-10-CM | POA: Diagnosis not present

## 2019-01-31 DIAGNOSIS — E7849 Other hyperlipidemia: Secondary | ICD-10-CM

## 2019-01-31 DIAGNOSIS — E039 Hypothyroidism, unspecified: Secondary | ICD-10-CM | POA: Diagnosis present

## 2019-01-31 DIAGNOSIS — E669 Obesity, unspecified: Secondary | ICD-10-CM | POA: Diagnosis present

## 2019-01-31 DIAGNOSIS — E785 Hyperlipidemia, unspecified: Secondary | ICD-10-CM | POA: Diagnosis present

## 2019-01-31 DIAGNOSIS — K219 Gastro-esophageal reflux disease without esophagitis: Secondary | ICD-10-CM | POA: Diagnosis present

## 2019-01-31 DIAGNOSIS — I5032 Chronic diastolic (congestive) heart failure: Secondary | ICD-10-CM | POA: Diagnosis present

## 2019-01-31 DIAGNOSIS — Z6832 Body mass index (BMI) 32.0-32.9, adult: Secondary | ICD-10-CM | POA: Diagnosis not present

## 2019-01-31 DIAGNOSIS — F17209 Nicotine dependence, unspecified, with unspecified nicotine-induced disorders: Secondary | ICD-10-CM

## 2019-01-31 DIAGNOSIS — J690 Pneumonitis due to inhalation of food and vomit: Secondary | ICD-10-CM | POA: Diagnosis present

## 2019-01-31 LAB — TROPONIN I (HIGH SENSITIVITY)
Troponin I (High Sensitivity): 11 ng/L (ref <18)
Troponin I (High Sensitivity): 33 ng/L — ABNORMAL HIGH (ref <18)

## 2019-01-31 LAB — COMPREHENSIVE METABOLIC PANEL
ALT: 11 U/L (ref 0–44)
ALT: 11 U/L (ref 0–44)
AST: 12 U/L — ABNORMAL LOW (ref 15–41)
AST: 14 U/L — ABNORMAL LOW (ref 15–41)
Albumin: 2.7 g/dL — ABNORMAL LOW (ref 3.5–5.0)
Albumin: 2.9 g/dL — ABNORMAL LOW (ref 3.5–5.0)
Alkaline Phosphatase: 115 U/L (ref 38–126)
Alkaline Phosphatase: 117 U/L (ref 38–126)
Anion gap: 10 (ref 5–15)
Anion gap: 12 (ref 5–15)
BUN: 6 mg/dL (ref 6–20)
BUN: 8 mg/dL (ref 6–20)
CO2: 18 mmol/L — ABNORMAL LOW (ref 22–32)
CO2: 20 mmol/L — ABNORMAL LOW (ref 22–32)
Calcium: 8.4 mg/dL — ABNORMAL LOW (ref 8.9–10.3)
Calcium: 8.7 mg/dL — ABNORMAL LOW (ref 8.9–10.3)
Chloride: 108 mmol/L (ref 98–111)
Chloride: 108 mmol/L (ref 98–111)
Creatinine, Ser: 0.74 mg/dL (ref 0.44–1.00)
Creatinine, Ser: 0.82 mg/dL (ref 0.44–1.00)
GFR calc Af Amer: >60 mL/min (ref >60)
GFR calc Af Amer: >60 mL/min (ref >60)
GFR calc non Af Amer: >60 mL/min (ref >60)
GFR calc non Af Amer: >60 mL/min (ref >60)
Glucose, Bld: 113 mg/dL — ABNORMAL HIGH (ref 70–99)
Glucose, Bld: 213 mg/dL — ABNORMAL HIGH (ref 70–99)
Potassium: 3.3 mmol/L — ABNORMAL LOW (ref 3.5–5.1)
Potassium: 3.5 mmol/L (ref 3.5–5.1)
Sodium: 138 mmol/L (ref 135–145)
Sodium: 138 mmol/L (ref 135–145)
Total Bilirubin: 0.2 mg/dL — ABNORMAL LOW (ref 0.3–1.2)
Total Bilirubin: 0.2 mg/dL — ABNORMAL LOW (ref 0.3–1.2)
Total Protein: 5.9 g/dL — ABNORMAL LOW (ref 6.5–8.1)
Total Protein: 6.1 g/dL — ABNORMAL LOW (ref 6.5–8.1)

## 2019-01-31 LAB — URINALYSIS, ROUTINE W REFLEX MICROSCOPIC
Bilirubin Urine: NEGATIVE
Glucose, UA: NEGATIVE mg/dL
Hgb urine dipstick: NEGATIVE
Ketones, ur: NEGATIVE mg/dL
Nitrite: NEGATIVE
Protein, ur: NEGATIVE mg/dL
Specific Gravity, Urine: 1.011 (ref 1.005–1.030)
pH: 6 (ref 5.0–8.0)

## 2019-01-31 LAB — BRAIN NATRIURETIC PEPTIDE: B Natriuretic Peptide: 190.6 pg/mL — ABNORMAL HIGH (ref 0.0–100.0)

## 2019-01-31 LAB — CBC
HCT: 28.6 % — ABNORMAL LOW (ref 36.0–46.0)
Hemoglobin: 8.8 g/dL — ABNORMAL LOW (ref 12.0–15.0)
MCH: 26.2 pg (ref 26.0–34.0)
MCHC: 30.8 g/dL (ref 30.0–36.0)
MCV: 85.1 fL (ref 80.0–100.0)
Platelets: 361 10*3/uL (ref 150–400)
RBC: 3.36 MIL/uL — ABNORMAL LOW (ref 3.87–5.11)
RDW: 15.3 % (ref 11.5–15.5)
WBC: 7.7 10*3/uL (ref 4.0–10.5)
nRBC: 0 % (ref 0.0–0.2)

## 2019-01-31 LAB — SARS CORONAVIRUS 2 BY RT PCR (HOSPITAL ORDER, PERFORMED IN ~~LOC~~ HOSPITAL LAB): SARS Coronavirus 2: NEGATIVE

## 2019-01-31 LAB — HIV ANTIBODY (ROUTINE TESTING W REFLEX): HIV Screen 4th Generation wRfx: NONREACTIVE

## 2019-01-31 LAB — STREP PNEUMONIAE URINARY ANTIGEN: Strep Pneumo Urinary Antigen: NEGATIVE

## 2019-01-31 MED ORDER — METOPROLOL SUCCINATE ER 25 MG PO TB24
25.0000 mg | ORAL_TABLET | Freq: Every day | ORAL | Status: DC
  Administered 2019-01-31 – 2019-02-02 (×3): 25 mg via ORAL
  Filled 2019-01-31 (×3): qty 1

## 2019-01-31 MED ORDER — SODIUM CHLORIDE 0.9 % IV SOLN
INTRAVENOUS | Status: DC | PRN
  Administered 2019-01-31: 500 mL via INTRAVENOUS

## 2019-01-31 MED ORDER — ALPRAZOLAM 0.5 MG PO TABS
1.0000 mg | ORAL_TABLET | Freq: Three times a day (TID) | ORAL | Status: DC | PRN
  Administered 2019-01-31 – 2019-02-02 (×4): 1 mg via ORAL
  Filled 2019-01-31 (×4): qty 2

## 2019-01-31 MED ORDER — NITROGLYCERIN 0.4 MG SL SUBL: 0.4000 mg | SUBLINGUAL_TABLET | SUBLINGUAL | Status: DC | PRN

## 2019-01-31 MED ORDER — GABAPENTIN 300 MG PO CAPS
300.0000 mg | ORAL_CAPSULE | Freq: Every day | ORAL | Status: DC
  Administered 2019-01-31 – 2019-02-01 (×2): 300 mg via ORAL
  Filled 2019-01-31 (×2): qty 1

## 2019-01-31 MED ORDER — SODIUM CHLORIDE 0.9 % IV SOLN
1.0000 g | Freq: Once | INTRAVENOUS | Status: AC
  Administered 2019-01-31: 02:00:00 1 g via INTRAVENOUS
  Filled 2019-01-31: qty 10

## 2019-01-31 MED ORDER — ALBUTEROL SULFATE (2.5 MG/3ML) 0.083% IN NEBU: 3.0000 mL | INHALATION_SOLUTION | Freq: Four times a day (QID) | RESPIRATORY_TRACT | Status: DC | PRN

## 2019-01-31 MED ORDER — BUPROPION HCL ER (SR) 150 MG PO TB12
150.0000 mg | ORAL_TABLET | Freq: Every day | ORAL | Status: DC
  Administered 2019-01-31 – 2019-02-02 (×3): 150 mg via ORAL
  Filled 2019-01-31 (×3): qty 1

## 2019-01-31 MED ORDER — ATORVASTATIN CALCIUM 80 MG PO TABS
80.0000 mg | ORAL_TABLET | Freq: Every day | ORAL | Status: DC
  Administered 2019-01-31 – 2019-02-02 (×3): 80 mg via ORAL
  Filled 2019-01-31 (×3): qty 1

## 2019-01-31 MED ORDER — FAMOTIDINE 20 MG PO TABS
20.0000 mg | ORAL_TABLET | Freq: Two times a day (BID) | ORAL | Status: DC
  Administered 2019-01-31 – 2019-02-02 (×5): 20 mg via ORAL
  Filled 2019-01-31 (×5): qty 1

## 2019-01-31 MED ORDER — ENOXAPARIN SODIUM 40 MG/0.4ML ~~LOC~~ SOLN
40.0000 mg | SUBCUTANEOUS | Status: DC
  Administered 2019-01-31 – 2019-02-02 (×3): 40 mg via SUBCUTANEOUS
  Filled 2019-01-31 (×3): qty 0.4

## 2019-01-31 MED ORDER — ALBUTEROL SULFATE HFA 108 (90 BASE) MCG/ACT IN AERS
4.0000 | INHALATION_SPRAY | Freq: Once | RESPIRATORY_TRACT | Status: AC
  Administered 2019-01-31: 4 via RESPIRATORY_TRACT

## 2019-01-31 MED ORDER — PANTOPRAZOLE SODIUM 40 MG PO TBEC
40.0000 mg | DELAYED_RELEASE_TABLET | Freq: Every day | ORAL | Status: DC
  Administered 2019-01-31 – 2019-02-02 (×3): 40 mg via ORAL
  Filled 2019-01-31 (×3): qty 1

## 2019-01-31 MED ORDER — OXYCODONE-ACETAMINOPHEN 5-325 MG PO TABS
2.0000 | ORAL_TABLET | Freq: Once | ORAL | Status: AC
  Administered 2019-01-31: 02:00:00 2 via ORAL
  Filled 2019-01-31: qty 2

## 2019-01-31 MED ORDER — SODIUM CHLORIDE 0.9 % IV SOLN
500.0000 mg | INTRAVENOUS | Status: DC
  Administered 2019-01-31 – 2019-02-01 (×2): 500 mg via INTRAVENOUS
  Filled 2019-01-31 (×3): qty 500

## 2019-01-31 MED ORDER — ALBUTEROL SULFATE (2.5 MG/3ML) 0.083% IN NEBU
3.0000 mL | INHALATION_SOLUTION | RESPIRATORY_TRACT | Status: DC | PRN
  Administered 2019-02-01: 3 mL via RESPIRATORY_TRACT
  Filled 2019-01-31: qty 3

## 2019-01-31 MED ORDER — SODIUM CHLORIDE 0.9 % IV SOLN
500.0000 mg | Freq: Once | INTRAVENOUS | Status: AC
  Administered 2019-01-31: 02:00:00 500 mg via INTRAVENOUS
  Filled 2019-01-31: qty 500

## 2019-01-31 MED ORDER — LURASIDONE HCL 20 MG PO TABS
30.0000 mg | ORAL_TABLET | Freq: Every day | ORAL | Status: DC
  Administered 2019-01-31 – 2019-02-02 (×3): 30 mg via ORAL
  Filled 2019-01-31 (×3): qty 2

## 2019-01-31 MED ORDER — OXYCODONE HCL 5 MG PO TABS
15.0000 mg | ORAL_TABLET | Freq: Three times a day (TID) | ORAL | Status: DC
  Administered 2019-01-31 – 2019-02-02 (×7): 15 mg via ORAL
  Filled 2019-01-31 (×7): qty 3

## 2019-01-31 MED ORDER — SODIUM CHLORIDE 0.9 % IV SOLN
INTRAVENOUS | Status: DC
  Administered 2019-01-31: 05:00:00 via INTRAVENOUS

## 2019-01-31 MED ORDER — SODIUM CHLORIDE 0.9 % IV SOLN
2.0000 g | INTRAVENOUS | Status: DC
  Administered 2019-01-31 – 2019-02-01 (×2): 2 g via INTRAVENOUS
  Filled 2019-01-31 (×2): qty 20

## 2019-01-31 MED ORDER — LISINOPRIL 5 MG PO TABS
5.0000 mg | ORAL_TABLET | Freq: Every day | ORAL | Status: DC
  Administered 2019-01-31 – 2019-02-02 (×3): 5 mg via ORAL
  Filled 2019-01-31 (×3): qty 1

## 2019-01-31 MED ORDER — FLUTICASONE FUROATE-VILANTEROL 100-25 MCG/INH IN AEPB
1.0000 | INHALATION_SPRAY | Freq: Every day | RESPIRATORY_TRACT | Status: DC
  Administered 2019-01-31 – 2019-02-02 (×3): 1 via RESPIRATORY_TRACT
  Filled 2019-01-31: qty 28

## 2019-01-31 MED ORDER — ZOLPIDEM TARTRATE 5 MG PO TABS
5.0000 mg | ORAL_TABLET | Freq: Every evening | ORAL | Status: DC | PRN
  Administered 2019-01-31 – 2019-02-01 (×2): 5 mg via ORAL
  Filled 2019-01-31 (×2): qty 1

## 2019-01-31 MED ORDER — FLUTICASONE-UMECLIDIN-VILANT 100-62.5-25 MCG/INH IN AEPB: 1.0000 | INHALATION_SPRAY | Freq: Every day | RESPIRATORY_TRACT | Status: DC

## 2019-01-31 MED ORDER — DULOXETINE HCL 60 MG PO CPEP
60.0000 mg | ORAL_CAPSULE | Freq: Every day | ORAL | Status: DC
  Administered 2019-01-31 – 2019-02-02 (×3): 60 mg via ORAL
  Filled 2019-01-31 (×3): qty 1

## 2019-01-31 MED ORDER — UMECLIDINIUM BROMIDE 62.5 MCG/INH IN AEPB
1.0000 | INHALATION_SPRAY | Freq: Every day | RESPIRATORY_TRACT | Status: DC
  Administered 2019-01-31 – 2019-02-02 (×3): 1 via RESPIRATORY_TRACT
  Filled 2019-01-31: qty 7

## 2019-01-31 MED ORDER — LEVOTHYROXINE SODIUM 25 MCG PO TABS
125.0000 ug | ORAL_TABLET | Freq: Every day | ORAL | Status: DC
  Administered 2019-01-31 – 2019-02-02 (×3): 125 ug via ORAL
  Filled 2019-01-31 (×3): qty 1

## 2019-01-31 MED ORDER — NICOTINE 21 MG/24HR TD PT24
21.0000 mg | MEDICATED_PATCH | Freq: Every day | TRANSDERMAL | Status: DC
  Administered 2019-01-31 – 2019-02-02 (×3): 21 mg via TRANSDERMAL
  Filled 2019-01-31 (×3): qty 1

## 2019-01-31 MED ORDER — METHYLPREDNISOLONE SODIUM SUCC 40 MG IJ SOLR
40.0000 mg | Freq: Two times a day (BID) | INTRAMUSCULAR | Status: DC
  Administered 2019-01-31 – 2019-02-02 (×5): 40 mg via INTRAVENOUS
  Filled 2019-01-31 (×5): qty 1

## 2019-01-31 MED ORDER — CLOPIDOGREL BISULFATE 75 MG PO TABS
75.0000 mg | ORAL_TABLET | Freq: Every day | ORAL | Status: DC
  Administered 2019-01-31 – 2019-02-02 (×3): 75 mg via ORAL
  Filled 2019-01-31 (×3): qty 1

## 2019-01-31 MED ORDER — ISOSORBIDE MONONITRATE ER 30 MG PO TB24
15.0000 mg | ORAL_TABLET | Freq: Every day | ORAL | Status: DC
  Administered 2019-01-31 – 2019-02-02 (×3): 15 mg via ORAL
  Filled 2019-01-31 (×3): qty 1

## 2019-01-31 NOTE — Progress Notes (Signed)
Notified by lab at 0351 patient troponin 33. Patient assessed; no distress only tenderness and muscle soreness in abdominal area and upper chest due to coughing. Paged NP [place] at [place] and notified of troponin and no distress. Will continue to monitor.

## 2019-01-31 NOTE — Progress Notes (Signed)
Erin Lynch is a 58 y.o. female patient admitted from ED awake, alert - oriented  X 4 - no acute distress noted.  VSS - Blood pressure (!) 136/95, pulse 78, temperature (!) 97.5 F (36.4 C), temperature source Oral, resp. rate 20, SpO2 100 %.    IV in place, occlusive dsg intact without redness.  Orientation to room, and floor completed with information packet given to patient/family.  Patient declined safety video at this time.  Admission INP armband ID verified with patient/family, and in place.    SR up x 2, fall assessment complete, with patient and family able to verbalize understanding of risk associated with falls, and verbalized understanding to call nsg before up out of bed.  Call light within reach, patient able to voice, and demonstrate understanding.    Skin to skin assessment completed with second RN. Skin integrity documented in chart. Skin, clean-dry- intact.   No evidence of skin break down noted on exam.     Will cont to eval and treat per MD orders.  Howard Pouch, RN 01/31/2019 4:27 AM

## 2019-01-31 NOTE — ED Notes (Signed)
ED TO INPATIENT HANDOFF REPORT  ED Nurse Name and Phone #: Jeannett Senior [phone]  S Name/Age/Gender Erin Lynch 58 y.o. female Room/Bed: 023C/023C  Code Status   Code Status: Prior  Home/SNF/Other Home Patient oriented to: self, place, time and situation Is this baseline? Yes   Triage Complete: Triage complete  Chief Complaint [facility]  Triage Note Pt arrives via [place] [facility] with SOB. Pt recently diagnoses with COPD. Bilateral wheezing lung sounds. Pt placed on 2L O2 via [place] with SPO2 96%. Pt with new onset of couch a few days ago, denies CP, and/or fever.  Approximately a week ago pt had a fall that required 14 stitches in her left leg. At that time pt tested negavite for covid.  EMS VS 104/66, HR 72, CBG 160, TEMP 97.89F, RR 24.    Allergies No Known Allergies  Level of Care/Admitting Diagnosis ED Disposition    ED Disposition Condition Comment   Admit  Hospital Area: [facility] [[id number]]  Level of Care: Telemetry Medical [104]  Covid Evaluation: Confirmed COVID Negative  Diagnosis: Pneumonia [227785]  Admitting Physician: Rometta Emery [[id number]]  Attending Physician: Rometta Emery [[id number]]  Estimated length of stay: past midnight tomorrow  Certification:: I certify this patient will need inpatient services for at least 2 midnights  PT Class (Do Not Modify): Inpatient [101]  PT Acc Code (Do Not Modify): Private [1]       B Medical/Surgery History Past Medical History:  Diagnosis Date  . Anxiety   . Arthritis   . Bulging disc   . CAD (coronary artery disease)    a. NSTEMI 12/2011: BMS-LAD b. 09/2015: cath showing patent stent w/ mild nonobstructive CAD.  Marland Kitchen Chronic diastolic CHF (congestive heart failure) (HCC)   . Depression   . Fibromyalgia   . GERD (gastroesophageal reflux disease)   . Hyperlipidemia LDL goal < 70   . Hypertension   . Hypothyroid   . Obesity (BMI 30.0-34.9)    Past Surgical History:  Procedure Laterality Date  . CARDIAC  CATHETERIZATION  01/01/2012    WITH CORONARY ANGIOGRAM, left heart   . CARDIAC CATHETERIZATION N/A 10/11/2015   Procedure: Left Heart Cath and Coronary Angiography;  Surgeon: Tonny Bollman, MD;  Location: [facility] INVASIVE CV LAB;  Service: Cardiovascular;  Laterality: N/A;  . CORONARY STENT PLACEMENT    . LEFT HEART CATHETERIZATION WITH CORONARY ANGIOGRAM N/A 01/01/2012   Procedure: LEFT HEART CATHETERIZATION WITH CORONARY ANGIOGRAM;  Surgeon: Iran Ouch, MD;  Location: [facility] CATH LAB;  Service: Cardiovascular;  Laterality: N/A;     A IV Location/Drains/Wounds Patient Lines/Drains/Airways Status   Active Line/Drains/Airways    Name:   Placement date:   Placement time:   Site:   Days:   Peripheral IV 01/30/19 Anterior;Proximal;Right Forearm   01/30/19    2215    Forearm   1   Peripheral IV 01/30/19 Left Antecubital   01/30/19    2324    Antecubital   1          Intake/Output Last 24 hours No intake or output data in the 24 hours ending 01/31/19 0200  Labs/Imaging Results for orders placed or performed during the hospital encounter of 01/30/19 (from the past 48 hour(s))  SARS Coronavirus 2 (CEPHEID- Performed in [facility] hospital lab), Hosp Order     Status: None   Collection Time: 01/30/19 11:27 PM   Specimen: Nasopharyngeal Swab  Result Value Ref Range   SARS Coronavirus 2 NEGATIVE NEGATIVE  Comment: (NOTE) If result is NEGATIVE SARS-CoV-2 target nucleic acids are NOT DETECTED. The SARS-CoV-2 RNA is generally detectable in upper and lower  respiratory specimens during the acute phase of infection. The lowest  concentration of SARS-CoV-2 viral copies this assay can detect is 250  copies / mL. A negative result does not preclude SARS-CoV-2 infection  and should not be used as the sole basis for treatment or other  patient management decisions.  A negative result may occur with  improper specimen collection / handling, submission of specimen other  than nasopharyngeal swab,  presence of viral mutation(s) within the  areas targeted by this assay, and inadequate number of viral copies  (<250 copies / mL). A negative result must be combined with clinical  observations, patient history, and epidemiological information. If result is POSITIVE SARS-CoV-2 target nucleic acids are DETECTED. The SARS-CoV-2 RNA is generally detectable in upper and lower  respiratory specimens dur ing the acute phase of infection.  Positive  results are indicative of active infection with SARS-CoV-2.  Clinical  correlation with patient history and other diagnostic information is  necessary to determine patient infection status.  Positive results do  not rule out bacterial infection or co-infection with other viruses. If result is PRESUMPTIVE POSTIVE SARS-CoV-2 nucleic acids MAY BE PRESENT.   A presumptive positive result was obtained on the submitted specimen  and confirmed on repeat testing.  While 2019 novel coronavirus  (SARS-CoV-2) nucleic acids may be present in the submitted sample  additional confirmatory testing may be necessary for epidemiological  and / or clinical management purposes  to differentiate between  SARS-CoV-2 and other Sarbecovirus currently known to infect humans.  If clinically indicated additional testing with an alternate test  methodology [phone] is advised. The SARS-CoV-2 RNA is generally  detectable in upper and lower respiratory sp ecimens during the acute  phase of infection. The expected result is Negative. Fact Sheet for Patients:  BoilerBrush.com.cyhttps://www.fda.gov/media/136312/download Fact Sheet for Healthcare Providers: https://pope.com/https://www.fda.gov/media/136313/download This test is not yet approved or cleared by the [place] FDA and has been authorized for detection and/or diagnosis of SARS-CoV-2 by FDA under an Emergency Use Authorization (EUA).  This EUA will remain in effect (meaning this test can be used) for the duration of the COVID-19 declaration under  Section 564(b)(1) of the Act, 21 U.S.C. section 360bbb-3(b)(1), unless the authorization is terminated or revoked sooner. Performed at [facility], [address] [place], [place] [address]   POCT I-Stat EG7     Status: Abnormal   Collection Time: 01/30/19 11:38 PM  Result Value Ref Range   pH, Ven 7.337 7.250 - 7.430   pCO2, Ven 38.7 (L) 44.0 - 60.0 mmHg   pO2, Ven 42.0 32.0 - 45.0 mmHg   Bicarbonate 20.7 20.0 - 28.0 mmol/L   TCO2 22 22 - 32 mmol/L   O2 Saturation 75.0 %   Acid-base deficit 5.0 (H) 0.0 - 2.0 mmol/L   Sodium 141 135 - 145 mmol/L   Potassium 3.3 (L) 3.5 - 5.1 mmol/L   Calcium, Ion 1.23 1.15 - 1.40 mmol/L   HCT 28.0 (L) 36.0 - 46.0 %   Hemoglobin 9.5 (L) 12.0 - 15.0 g/dL   Patient temperature HIDE    Sample type VENOUS   Comprehensive metabolic panel     Status: Abnormal   Collection Time: 01/30/19 11:42 PM  Result Value Ref Range   Sodium 138 135 - 145 mmol/L   Potassium 3.3 (L) 3.5 - 5.1 mmol/L  Chloride 108 98 - 111 mmol/L   CO2 20 (L) 22 - 32 mmol/L   Glucose, Bld 113 (H) 70 - 99 mg/dL   BUN 8 6 - 20 mg/dL   Creatinine, Ser 1.300.82 0.44 - 1.00 mg/dL   Calcium 8.7 (L) 8.9 - 10.3 mg/dL   Total Protein 5.9 (L) 6.5 - 8.1 g/dL   Albumin 2.7 (L) 3.5 - 5.0 g/dL   AST 14 (L) 15 - 41 U/L   ALT 11 0 - 44 U/L   Alkaline Phosphatase 115 38 - 126 U/L   Total Bilirubin 0.2 (L) 0.3 - 1.2 mg/dL   GFR calc non Af Amer >60 >60 mL/min   GFR calc Af Amer >60 >60 mL/min   Anion gap 10 5 - 15    Comment: Performed at [facility], [address] [place], [place] [address]  CBC WITH DIFFERENTIAL     Status: Abnormal   Collection Time: 01/30/19 11:42 PM  Result Value Ref Range   WBC 15.6 (H) 4.0 - 10.5 K/uL   RBC 3.38 (L) 3.87 - 5.11 MIL/uL   Hemoglobin 9.0 (L) 12.0 - 15.0 g/dL   HCT 46.929.5 (L) 62.936.0 - 52.846.0 %   MCV 87.3 80.0 - 100.0 fL   MCH 26.6 26.0 - 34.0 pg   MCHC 30.5 30.0 - 36.0 g/dL   RDW 41.315.5 24.411.5 - 01.015.5 %   Platelets 418 (H) 150 - 400 K/uL   nRBC  0.0 0.0 - 0.2 %   Neutrophils Relative % 87 %   Neutro Abs 13.7 (H) 1.7 - 7.7 K/uL   Lymphocytes Relative 7 %   Lymphs Abs 1.1 0.7 - 4.0 K/uL   Monocytes Relative 4 %   Monocytes Absolute 0.6 0.1 - 1.0 K/uL   Eosinophils Relative 1 %   Eosinophils Absolute 0.1 0.0 - 0.5 K/uL   Basophils Relative 0 %   Basophils Absolute 0.0 0.0 - 0.1 K/uL   Immature Granulocytes 1 %   Abs Immature Granulocytes 0.15 (H) 0.00 - 0.07 K/uL    Comment: Performed at [facility], [address] [place], [place] [address]  Troponin I (High Sensitivity)     Status: None   Collection Time: 01/30/19 11:42 PM  Result Value Ref Range   Troponin I (High Sensitivity) 11 <18 ng/L    Comment: (NOTE) Elevated high sensitivity troponin I (hsTnI) values and significant  changes across serial measurements may suggest ACS but many other  chronic and acute conditions are known to elevate hsTnI results.  Refer to the Links section for chest pain algorithms and additional  guidance. Performed at [facility], [address] [place], [place] [address]   Brain natriuretic peptide     Status: Abnormal   Collection Time: 01/30/19 11:43 PM  Result Value Ref Range   B Natriuretic Peptide 190.6 (H) 0.0 - 100.0 pg/mL    Comment: Performed at [facility], [address] [place], [place] [address]  Urinalysis, Routine w reflex microscopic     Status: Abnormal   Collection Time: 01/30/19 11:46 PM  Result Value Ref Range   Color, Urine YELLOW YELLOW   APPearance CLEAR CLEAR   Specific Gravity, Urine 1.011 1.005 - 1.030   pH 6.0 5.0 - 8.0   Glucose, UA NEGATIVE NEGATIVE mg/dL   Hgb urine dipstick NEGATIVE NEGATIVE   Bilirubin Urine NEGATIVE NEGATIVE   Ketones, ur NEGATIVE NEGATIVE mg/dL   Protein, ur NEGATIVE NEGATIVE mg/dL  Nitrite NEGATIVE NEGATIVE   Leukocytes,Ua MODERATE (A) NEGATIVE   RBC / HPF 0-5 0 - 5 RBC/hpf   WBC, UA 0-5 0 - 5 WBC/hpf   Bacteria, UA FEW (A) NONE SEEN   Squamous Epithelial  / LPF 0-5 0 - 5    Comment: Performed at [facility], [place] [address], [place], [place] [address]   Dg Chest Portable 1 View  Result Date: 01/30/2019 CLINICAL DATA:  58 year old female with shortness of breath. EXAM: PORTABLE CHEST 1 VIEW COMPARISON:  Chest radiograph dated 12/25/2018 FINDINGS: Left lung base hazy density may represent combination of small pleural effusion and associated atelectasis versus an area of developing infiltrate. Clinical correlation is recommended. The right lung is clear. There is no pneumothorax. The cardiac silhouette is within normal limits. No acute osseous pathology. IMPRESSION: Small left pleural effusion and left lung base atelectasis versus developing infiltrate. Electronically Signed   By: Anner Crete M.D.   On: 01/30/2019 23:10    Pending Labs [employer] (From admission, onward)    Start     Ordered   Signed and Held  Culture, blood (routine x 2) Call MD if unable to obtain prior to antibiotics being given  BLOOD CULTURE X 2,   R    Comments: If blood cultures drawn in Emergency Department - Do not draw and cancel order    Signed and Held   Signed and Held  Culture, sputum-assessment  Once,   R     Signed and Held   Signed and Held  HIV antibody (Routine Screening)  Once,   R     Signed and Held   Signed and Held  Strep pneumoniae urinary antigen  Once,   R     Signed and Held   Signed and Held  Comprehensive metabolic panel  Tomorrow morning,   R     Signed and Held   Signed and Held  CBC  (enoxaparin (LOVENOX)    CrCl >/= 30 ml/min)  Once,   R    Comments: Baseline for enoxaparin therapy IF NOT ALREADY DRAWN.  Notify MD if PLT < 100 K.    Signed and Held   Signed and Held  Creatinine, serum  (enoxaparin (LOVENOX)    CrCl >/= 30 ml/min)  Once,   R    Comments: Baseline for enoxaparin therapy IF NOT ALREADY DRAWN.    Signed and Held   Signed and Held  Creatinine, serum  (enoxaparin (LOVENOX)    CrCl >/= 30 ml/min)  Weekly,   R     Comments: while on enoxaparin therapy    Signed and Held          Vitals/Pain Today's Vitals   01/30/19 2330 01/30/19 2345 01/31/19 0000 01/31/19 0054  BP: (!) 144/87 (!) 146/97 (!) 150/109 128/72  Pulse: 75 78 78 78  Resp: (!) 22 (!) 26 (!) 31 (!) 22  Temp:      TempSrc:      SpO2: 94% 96% 96% 94%  PainSc:        Isolation Precautions Airborne and Contact precautions  Medications Medications  cefTRIAXone (ROCEPHIN) 1 g in sodium chloride 0.9 % 100 mL IVPB (1 g Intravenous New Bag/Given 01/31/19 0138)  azithromycin (ZITHROMAX) 500 mg in sodium chloride 0.9 % 250 mL IVPB (500 mg Intravenous New Bag/Given 01/31/19 0140)  albuterol (VENTOLIN HFA) 108 (90 Base) MCG/ACT inhaler 8 puff (8 puffs Inhalation Given 01/30/19 2341)  albuterol (VENTOLIN HFA) 108 (90  Base) MCG/ACT inhaler 4 puff (4 puffs Inhalation Given 01/31/19 0144)  oxyCODONE-acetaminophen (PERCOCET/ROXICET) 5-325 MG per tablet 2 tablet (2 tablets Oral Given 01/31/19 0159)    Mobility walks with device High fall risk   Focused Assessments Pulmonary Assessment Handoff:  Lung sounds: Bilateral Breath Sounds: Expiratory wheezes, Inspiratory wheezes L Breath Sounds: Inspiratory wheezes, Expiratory wheezes R Breath Sounds: Expiratory wheezes, Inspiratory wheezes O2 Device: Nasal Cannula        R Recommendations: See Admitting Provider Note  Report given to:   Additional Notes:

## 2019-01-31 NOTE — H&P (Signed)
History and Physical   Erin Lynch TFT:[id number] DOB: 12-Jan-1961 DOA: 01/30/2019  Referring MD/NP/PA: Dr. Christy Gentles  PCP: Irven Shelling, MD   Outpatient Specialists: Dr. Aundra Dubin cardiology  Patient coming from: Home  Chief Complaint: Shortness of breath  HPI: Erin Lynch is a 58 y.o. female with medical history significant of recently diagnosed COPD, coronary artery disease, osteoarthritis, chronic pain syndrome, chronic diastolic CHF, fibromyalgia, hyperlipidemia, anxiety disorder presenting with shortness of breath cough and wheezing.  She has been having significant cough.  No fever or chills.  She was evaluated in the ER and found to have pneumonia with COPD exacerbation.  Patient being admitted for treatment..  ED Course: Temperature 97.5 blood pressure 160/89 pulse 81 respiratory 31 oxygen sats 94% room air.  White count is 15.6 hemoglobin 9.0 platelet count of 418.  Sodium 138 potassium 3.3 chloride 108 CO2 20 BUN 8 creatinine 0.82 calcium 8.7.  Urinalysis negative.  BNP 190.6. Review of Systems: As per HPI otherwise 10 point review of systems negative.    Past Medical History:  Diagnosis Date  . Anxiety   . Arthritis   . Bulging disc   . CAD (coronary artery disease)    a. NSTEMI 12/2011: BMS-LAD b. 09/2015: cath showing patent stent w/ mild nonobstructive CAD.  Marland Kitchen Chronic diastolic CHF (congestive heart failure) ([place])   . Depression   . Fibromyalgia   . GERD (gastroesophageal reflux disease)   . Hyperlipidemia LDL goal < 70   . Hypertension   . Hypothyroid   . Obesity (BMI 30.0-34.9)     Past Surgical History:  Procedure Laterality Date  . CARDIAC CATHETERIZATION  01/01/2012    WITH CORONARY ANGIOGRAM, left heart   . CARDIAC CATHETERIZATION N/A 10/11/2015   Procedure: Left Heart Cath and Coronary Angiography;  Surgeon: Sherren Mocha, MD;  Location: [facility];  Service: Cardiovascular;  Laterality: N/A;  . CORONARY STENT PLACEMENT    . LEFT HEART  CATHETERIZATION WITH CORONARY ANGIOGRAM N/A 01/01/2012   Procedure: LEFT HEART CATHETERIZATION WITH CORONARY ANGIOGRAM;  Surgeon: Wellington Hampshire, MD;  Location: [facility];  Service: Cardiovascular;  Laterality: N/A;     reports that she has been smoking cigarettes. She has a 25.00 pack-year smoking history. She has never used smokeless tobacco. She reports that she does not drink alcohol or use drugs.  No Known Allergies  Family History  Problem Relation Age of Onset  . Heart attack Mother   . Heart attack Father      Prior to Admission medications   Medication Sig Start Date End Date Taking? Authorizing Provider  ALPRAZolam Duanne Moron) 1 MG tablet Take 1 mg by mouth 3 (three) times daily. 09/28/15   [provider]  aspirin EC 81 MG EC tablet Take 1 tablet (81 mg total) by mouth daily. 10/11/15   Strader, Fransisco Hertz, PA-C  atorvastatin (LIPITOR) 80 MG tablet Take 80 mg by mouth daily. 08/01/15   [provider]  buPROPion (WELLBUTRIN SR) 150 MG 12 hr tablet Take 150 mg by mouth daily.    [provider]  clonazePAM (KLONOPIN) 0.5 MG tablet Take 0.5 mg by mouth 3 (three) times daily as needed for anxiety.    [provider]  clopidogrel (PLAVIX) 75 MG tablet TAKE 1 TABLET BY MOUTH EVERY DAY 05/04/13   Larey Dresser, MD  cyclobenzaprine (FLEXERIL) 10 MG tablet Take 10 mg by mouth 3 (three) times daily. 09/30/15   [provider]  DULoxetine (CYMBALTA) 60 MG  capsule Take 60 mg by mouth daily. 07/20/15   [provider]  gabapentin (NEURONTIN) 300 MG capsule Take 300 mg by mouth 3 (three) times daily.    [provider]  isosorbide mononitrate (IMDUR) 30 MG 24 hr tablet TAKE 0.5 TABLETS (15 MG TOTAL) BY MOUTH DAILY. 03/03/17   Strader, Lennart Pall, PA-C  LATUDA 60 MG TABS Take 60 mg by mouth daily. 09/30/15   [provider]  levothyroxine (SYNTHROID, LEVOTHROID) 125 MCG tablet Take 125 mcg by mouth daily. 10/09/15   [provider]  lisinopril (PRINIVIL,ZESTRIL) 5 MG tablet Take 5 mg by mouth daily. 07/20/15   [provider]  metoprolol succinate (TOPROL-XL) 25 MG 24 hr tablet Take 25 mg by mouth daily. 07/20/15   [provider]  nitroGLYCERIN (NITROSTAT) 0.4 MG SL tablet Place 1 tablet (0.4 mg total) under the tongue every 5 (five) minutes as needed for chest pain. 01/02/12 01/01/13  Barrett, Joline Salt, PA-C  omeprazole (PRILOSEC) 40 MG capsule Take 40 mg by mouth daily. 08/30/15   [provider]  pramipexole (MIRAPEX) 0.125 MG tablet Take 0.125 mg by mouth at bedtime as needed (restless legs).  08/08/15   [provider]  predniSONE (DELTASONE) 20 MG tablet Take 20 mg by mouth daily with breakfast.    [provider]  ranitidine (ZANTAC) 300 MG tablet Take 1 tablet (300 mg total) by mouth at bedtime. 02/05/12   Laurey Morale, MD  zolpidem (AMBIEN) 10 MG tablet Take 10 mg by mouth at bedtime as needed. for sleep 10/01/15   [provider]    Physical Exam: Vitals:   01/30/19 2330 01/30/19 2345 01/31/19 0000 01/31/19 0054  BP: (!) 144/87 (!) 146/97 (!) 150/109 128/72  Pulse: 75 78 78 78  Resp: (!) 22 (!) 26 (!) 31 (!) 22  Temp:      TempSrc:      SpO2: 94% 96% 96% 94%      Constitutional: NAD, calm, comfortable Vitals:   01/30/19 2330 01/30/19 2345 01/31/19 0000 01/31/19 0054  BP: (!) 144/87 (!) 146/97 (!) 150/109 128/72  Pulse: 75 78 78 78  Resp: (!) 22 (!) 26 (!) 31 (!) 22  Temp:      TempSrc:      SpO2: 94% 96% 96% 94%   Eyes: PERRL, lids and conjunctivae normal ENMT: Mucous membranes are moist. Posterior pharynx clear of any exudate or lesions.Normal dentition.  Neck: normal, supple, no masses, no thyromegaly Respiratory: Decreased air entry bilaterally with marked expiratory wheezing, no crackles. Normal respiratory effort. No accessory muscle use.  Cardiovascular: Regular rate and rhythm, no murmurs / rubs / gallops. No extremity edema.  2+ pedal pulses. No carotid bruits.  Abdomen: no tenderness, no masses palpated. No hepatosplenomegaly. Bowel sounds positive.  Musculoskeletal: no clubbing / cyanosis. No joint deformity upper and lower extremities. Good ROM, no contractures. Normal muscle tone.  Skin: no rashes, lesions, ulcers. No induration Neurologic: CN 2-12 grossly intact. Sensation intact, DTR normal. Strength 5/5 in all 4.  Psychiatric: Normal judgment and insight. Alert and oriented x 3. Normal mood.     Labs on Admission: I have personally reviewed following labs and imaging studies  CBC: Recent Labs  Lab 01/30/19 2338 01/30/19 2342  WBC  --  15.6*  NEUTROABS  --  13.7*  HGB 9.5* 9.0*  HCT 28.0* 29.5*  MCV  --  87.3  PLT  --  418*   Basic Metabolic Panel: Recent Labs  Lab 01/30/19 2338 01/30/19 2342  NA 141 138  K 3.3* 3.3*  CL  --  108  CO2  --  20*  GLUCOSE  --  113*  BUN  --  8  CREATININE  --  0.82  CALCIUM  --  8.7*   GFR: CrCl cannot be calculated (Unknown ideal weight.). Liver Function Tests: Recent Labs  Lab 01/30/19 2342  AST 14*  ALT 11  ALKPHOS 115  BILITOT 0.2*  PROT 5.9*  ALBUMIN 2.7*   No results for input(s): LIPASE, AMYLASE in the last 168 hours. No results for input(s): AMMONIA in the last 168 hours. Coagulation Profile: No results for input(s): INR, PROTIME in the last 168 hours. Cardiac Enzymes: No results for input(s): CKTOTAL, CKMB, CKMBINDEX, TROPONINI in the last 168 hours. BNP (last 3 results) No results for input(s): PROBNP in the last 8760 hours. HbA1C: No results for input(s): HGBA1C in the last 72 hours. CBG: No results for input(s): GLUCAP in the last 168 hours. Lipid Profile: No results for input(s): CHOL, HDL, LDLCALC, TRIG, CHOLHDL, LDLDIRECT in the last 72 hours. Thyroid Function Tests: No results for input(s): TSH, T4TOTAL, FREET4, T3FREE, THYROIDAB in the last 72 hours. Anemia Panel: No results for input(s): VITAMINB12, FOLATE, FERRITIN,  TIBC, IRON, RETICCTPCT in the last 72 hours. Urine analysis:    Component Value Date/Time   COLORURINE YELLOW 01/30/2019 2346   APPEARANCEUR CLEAR 01/30/2019 2346   LABSPEC 1.011 01/30/2019 2346   PHURINE 6.0 01/30/2019 2346   GLUCOSEU NEGATIVE 01/30/2019 2346   HGBUR NEGATIVE 01/30/2019 2346   BILIRUBINUR NEGATIVE 01/30/2019 2346   KETONESUR NEGATIVE 01/30/2019 2346   PROTEINUR NEGATIVE 01/30/2019 2346   NITRITE NEGATIVE 01/30/2019 2346   LEUKOCYTESUR MODERATE (A) 01/30/2019 2346   Sepsis Labs: @LABRCNTIP (procalcitonin:4,lacticidven:4) ) Recent Results (from the past 240 hour(s))  SARS Coronavirus 2 (CEPHEID- Performed [facility]), Hosp Order     Status: None   Collection Time: 01/30/19 11:27 PM   Specimen: Nasopharyngeal Swab  Result Value Ref Range Status   SARS Coronavirus 2 NEGATIVE NEGATIVE Final    Comment: (NOTE) If result is NEGATIVE SARS-CoV-2 target nucleic acids are NOT DETECTED. The SARS-CoV-2 RNA is generally detectable in upper and lower  respiratory specimens during the acute phase of infection. The lowest  concentration of SARS-CoV-2 viral copies this assay can detect is 250  copies / mL. A negative result does not preclude SARS-CoV-2 infection  and should not be used as the sole basis for treatment or other  patient management decisions.  A negative result may occur with  improper specimen collection / handling, submission of specimen other  than nasopharyngeal swab, presence of viral mutation(s) within the  areas targeted by this assay, and inadequate number of viral copies  (<250 copies / mL). A negative result must be combined with clinical  observations, patient history, and epidemiological information. If result is POSITIVE SARS-CoV-2 target nucleic acids are DETECTED. The SARS-CoV-2 RNA is generally detectable in upper and lower  respiratory specimens dur ing the acute phase of infection.  Positive  results are indicative of active  infection with SARS-CoV-2.  Clinical  correlation with patient history and other diagnostic information is  necessary to determine patient infection status.  Positive results do  not rule out bacterial infection or co-infection with other viruses. If result is PRESUMPTIVE POSTIVE SARS-CoV-2 nucleic acids MAY BE PRESENT.   A presumptive positive result was obtained on the submitted specimen  and confirmed on repeat  testing.  While 2019 novel coronavirus  (SARS-CoV-2) nucleic acids may be present in the submitted sample  additional confirmatory testing may be necessary for epidemiological  and / or clinical management purposes  to differentiate between  SARS-CoV-2 and other Sarbecovirus currently known to infect humans.  If clinically indicated additional testing with an alternate test  methodology [phone]([id number]) is advised. The SARS-CoV-2 RNA is generally  detectable in upper and lower respiratory sp ecimens during the acute  phase of infection. The expected result is Negative. Fact Sheet for Patients:  BoilerBrush.com.cyhttps://www.fda.gov/media/136312/download Fact Sheet for Healthcare Providers: https://pope.com/https://www.fda.gov/media/136313/download This test is not yet approved or cleared by the [place] FDA and has been authorized for detection and/or diagnosis of SARS-CoV-2 by FDA under an Emergency Use Authorization (EUA).  This EUA will remain in effect (meaning this test can be used) for the duration of the COVID-19 declaration under Section 564(b)(1) of the Act, 21 U.S.C. section 360bbb-3(b)(1), unless the authorization is terminated or revoked sooner. Performed at [facility], [address] [place], [place] [address]      Radiological Exams on Admission: Dg Chest Portable 1 View  Result Date: 01/30/2019 CLINICAL DATA:  58 year old female with shortness of breath. EXAM: PORTABLE CHEST 1 VIEW COMPARISON:  Chest radiograph dated 12/25/2018 FINDINGS: Left lung base hazy density may represent  combination of small pleural effusion and associated atelectasis versus an area of developing infiltrate. Clinical correlation is recommended. The right lung is clear. There is no pneumothorax. The cardiac silhouette is within normal limits. No acute osseous pathology. IMPRESSION: Small left pleural effusion and left lung base atelectasis versus developing infiltrate. Electronically Signed   By: Elgie CollardArash  Radparvar M.D.   On: 01/30/2019 23:10    EKG: Independently reviewed.  It shows sinus rhythm with a rate of 78, prolonged QT interval.  Assessment/Plan Principal Problem:   Pneumonia Active Problems:   Hyperlipidemia   Bipolar disorder, curr episode depressed, severe, w/psychotic features (HCC)   Coronary artery disease involving native coronary artery     #1 Healthcare associated pneumonia: Patient has chest x-ray showing small left pleural effusion and left lung base atelectasis versus developing infiltrate.  She had similar findings including May of this year.  Will be treated with Vanco and cefepime.  Blood cultures and sputum cultures will be done.  #2 acute exacerbation of COPD: Patient will be initiated on IV steroid antibiotic and continue with breathing treatment.  #3 coronary artery disease: Appears stable.  Continue treatment  #4 bipolar disorder: Stable.  Continue close monitoring.   #5 leukocytosis: Most likely due to pneumonia.  Continue treatment  #6 normocytic anemia: H&H stable.  #7 chronic pain syndrome: Resume home pain medications and continue  DVT prophylaxis: Lovenox Code Status: Full code Family Communication: No family at bedside Disposition Plan: Home Consults called: None Admission status: Inpatient  Severity of Illness: The appropriate patient status for this patient is INPATIENT. Inpatient status is judged to be reasonable and necessary in order to provide the required intensity of service to ensure the patient's safety. The patient's presenting  symptoms, physical exam findings, and initial radiographic and laboratory data in the context of their chronic comorbidities is felt to place them at high risk for further clinical deterioration. Furthermore, it is not anticipated that the patient will be medically stable for discharge from the hospital within 2 midnights of admission. The following factors support the patient status of inpatient.   " The patient's presenting symptoms include shortness of breath. " The  worrisome physical exam findings include bilateral expiratory wheezing. " The initial radiographic and laboratory data are worrisome because of evidence of pneumonia on chest x-ray. " The chronic co-morbidities include coronary artery disease.   * I certify that at the point of admission it is my clinical judgment that the patient will require inpatient hospital care spanning beyond 2 midnights from the point of admission due to high intensity of service, high risk for further deterioration and high frequency of surveillance required.Lonia Blood*    GARBA,LAWAL MD [facility] Pager [phone]  If 7PM-7AM, please contact night-coverage [email or web address] Password [email or web address]  01/31/2019, 1:28 AM

## 2019-01-31 NOTE — Progress Notes (Signed)
Same day note  Patient seen and examined at bedside.  Patient was admitted to the hospital for pneumonia.  History of COPD, coronary artery disease.  At the time of my evaluation, patient complains of cough and mild shortness of breath.  On 2 L of nasal cannula.  She also complains of left knee pain from her recent fall   Physical examination reveals obese female on nasal cannula is reading 97%.  Chest examination shows diminished breath sounds with mild expiratory wheezes.  Lower extremity with left knee laceration status post sutures.  COVID-19 test was negative.  Vitals, laboratory data and imaging was reviewed.  X-ray showed some left pleural effusion and left lung base atelectasis versus developing infiltrate.  Vitals:   01/31/19 0442 01/31/19 0838  BP: 126/72   Pulse: 72   Resp: 20   Temp: 98 F (36.7 C)   SpO2: 97% 97%   Assessment and Plan.  community-acquired pneumonia with acute exacerbation of COPD. continue antibiotic to Rocephin and Zithromax for now.  Follow blood cultures.  Continue oxygen, nebulizer and supportive care.   With the patient's husband on the phone and updated both the clinical condition of the patient.  Patient was not on oxygen at home but she smokes constantly as per the husband.  We will add the patient on nicotine patch while in the hospital.  Continue nebulizers.  Increase frequency.  Continue IV Solu-Medrol for now.  No Charge    Signed,  Delila Pereyra, MD [facility]

## 2019-01-31 NOTE — ED Notes (Signed)
Attempted report x1. 

## 2019-02-01 ENCOUNTER — Inpatient Hospital Stay (HOSPITAL_COMMUNITY): Payer: Medicare HMO

## 2019-02-01 DIAGNOSIS — R0602 Shortness of breath: Secondary | ICD-10-CM

## 2019-02-01 LAB — CBC
HCT: 25.9 % — ABNORMAL LOW (ref 36.0–46.0)
Hemoglobin: 8 g/dL — ABNORMAL LOW (ref 12.0–15.0)
MCH: 26.1 pg (ref 26.0–34.0)
MCHC: 30.9 g/dL (ref 30.0–36.0)
MCV: 84.6 fL (ref 80.0–100.0)
Platelets: 376 10*3/uL (ref 150–400)
RBC: 3.06 MIL/uL — ABNORMAL LOW (ref 3.87–5.11)
RDW: 15.5 % (ref 11.5–15.5)
WBC: 8 10*3/uL (ref 4.0–10.5)
nRBC: 0 % (ref 0.0–0.2)

## 2019-02-01 LAB — ECHOCARDIOGRAM COMPLETE
Height: 63 in
Weight: 2910.07 oz

## 2019-02-01 LAB — BASIC METABOLIC PANEL
Anion gap: 8 (ref 5–15)
BUN: 10 mg/dL (ref 6–20)
CO2: 23 mmol/L (ref 22–32)
Calcium: 8.5 mg/dL — ABNORMAL LOW (ref 8.9–10.3)
Chloride: 107 mmol/L (ref 98–111)
Creatinine, Ser: 0.65 mg/dL (ref 0.44–1.00)
GFR calc Af Amer: >60 mL/min (ref >60)
GFR calc non Af Amer: >60 mL/min (ref >60)
Glucose, Bld: 125 mg/dL — ABNORMAL HIGH (ref 70–99)
Potassium: 3.8 mmol/L (ref 3.5–5.1)
Sodium: 138 mmol/L (ref 135–145)

## 2019-02-01 LAB — BRAIN NATRIURETIC PEPTIDE: B Natriuretic Peptide: 253.7 pg/mL — ABNORMAL HIGH (ref 0.0–100.0)

## 2019-02-01 MED ORDER — POLYETHYLENE GLYCOL 3350 17 G PO PACK
17.0000 g | PACK | Freq: Every day | ORAL | Status: DC
  Administered 2019-02-01 – 2019-02-02 (×2): 17 g via ORAL
  Filled 2019-02-01 (×2): qty 1

## 2019-02-01 MED ORDER — FUROSEMIDE 10 MG/ML IJ SOLN
40.0000 mg | Freq: Once | INTRAMUSCULAR | Status: AC
  Administered 2019-02-01: 40 mg via INTRAVENOUS
  Filled 2019-02-01: qty 4

## 2019-02-01 NOTE — Progress Notes (Signed)
  Echocardiogram 2D Echocardiogram has been performed.  Yasser Hepp L Androw 02/01/2019, 2:04 PM

## 2019-02-01 NOTE — Progress Notes (Signed)
PROGRESS NOTE  Erin Lynch WUJ:[id number] DOB: 11/22/1960 DOA: 01/30/2019 PCP: Clelia CroftGeorge, Robert, MD   LOS: 1 day   Brief narrative: Erin JudeJoan Malachi is a 58 y.o. female with medical history significant of recently diagnosed COPD, coronary artery disease, osteoarthritis, chronic pain syndrome, chronic diastolic CHF, fibromyalgia, hyperlipidemia, anxiety disorder presenting with shortness of breath cough and wheezing.  She has been having significant cough.  No fever or chills.  She was evaluated in the ER and found to have pneumonia with COPD exacerbation.  Patient being admitted for treatment..  ED Course: Temperature 97.5 blood pressure 160/89 pulse 81 respiratory 31 oxygen sats 94% room air.  White count is 15.6 hemoglobin 9.0 platelet count of 418.  Sodium 138 potassium 3.3 chloride 108 CO2 20 BUN 8 creatinine 0.82 calcium 8.7.  Urinalysis negative.  BNP 190.6.  Assessment/Plan:  Principal Problem:   Pneumonia Active Problems:   Hyperlipidemia   Bipolar disorder, curr episode depressed, severe, w/psychotic features (HCC)   Coronary artery disease involving native coronary artery  community-acquired pneumonia with acute exacerbation of COPD. continue antibiotic to Rocephin and Zithromax for now.  Follow blood cultures.  Continue oxygen, nebulizer and supportive care.   Community-acquired pneumonia.  Patient still has acute hypoxic respiratory failure and has not significantly improved.  Continue oxygen nebulizers and antibiotics.  We continue IV steroids.  Patient was extensively counseled regarding quitting smoking. Chest x-ray showing small left pleural effusion and left lung base atelectasis versus developing infiltrate.  She had similar findings including May of this year.   Blood cultures negative in less than 12 hours at this time. COVID19 test was negative.  Patient is afebrile.  No leukocytosis today.  Strep urinary antigen was negative.  HIV was negative.  Hypokalemia mild improved with  replacement.  Acute hypoxic respiratory failure possibly secondary to COPD exacerbation.  Patient does have history of CAD in the past.  No chest pain at this time.  Will do 2D echocardiogram to assess for cardiac issues causing hypoxia.  Check BNP as well.  Will wean oxygen as able.  acute exacerbation of COPD:  on IV steroid, antibiotic and continue with breathing treatment.  Continue nicotine patch and incentive spirometer.  coronary artery disease: Appears stable.  Continue treatment.  No chest pain  bipolar disorder: Stable.  Continue outpatient medication  leukocytosis: Most likely due to pneumonia.  Improved at this time.  normocytic anemia: H&H stable.  No evidence of bleeding.  chronic pain syndrome: Resume home pain medications  Chronic heavy tobacco smoker.  On nicotine patch.  VTE Prophylaxis: Lovenox  Code Status: Full code  Family Communication: Spoke with the patient's husband on the phone yesterday  Disposition Plan: Home likely in 1 to 2 days.  We will try to wean oxygen as able.  Continue to ambulate the patient as tolerated.  Check 2D echocardiogram BNP.  We will try 1 dose of IV Lasix today.    Consultants:  None  Procedures:  None  Antibiotics: Anti-infectives (From admission, onward)   Start     Dose/Rate Route Frequency Ordered Stop   01/31/19 2200  cefTRIAXone (ROCEPHIN) 2 g in sodium chloride 0.9 % 100 mL IVPB     2 g 200 mL/hr over 30 Minutes Intravenous Every 24 hours 01/31/19 0342 02/05/19 2159   01/31/19 2200  azithromycin (ZITHROMAX) 500 mg in sodium chloride 0.9 % 250 mL IVPB     500 mg 250 mL/hr over 60 Minutes Intravenous Every 24 hours 01/31/19 0342 02/05/19 2159  01/31/19 0115  cefTRIAXone (ROCEPHIN) 1 g in sodium chloride 0.9 % 100 mL IVPB     1 g 200 mL/hr over 30 Minutes Intravenous  Once 01/31/19 0103 01/31/19 0217   01/31/19 0115  azithromycin (ZITHROMAX) 500 mg in sodium chloride 0.9 % 250 mL IVPB     500 mg 250 mL/hr  over 60 Minutes Intravenous  Once 01/31/19 0103 01/31/19 0240      Subjective: Complains of not feeling well, still has dyspnea cough.  She complains constipation for several days.  Objective: Vitals:   02/01/19 0538 02/01/19 0921  BP: (!) 148/71   Pulse: 67   Resp: 16   Temp: 98.6 F (37 C)   SpO2: 99% 98%    Intake/Output Summary (Last 24 hours) at 02/01/2019 1056 Last data filed at 01/31/2019 1300 Gross per 24 hour  Intake 0.72 ml  Output 400 ml  Net -399.28 ml   Filed Weights   01/31/19 0442  Weight: 82.5 kg   Body mass index is 32.22 kg/m.   Physical Exam: GENERAL: Patient is alert awake and oriented.  On nasal cannula oxygen.  Appears to be slightly tachypneic  HENT: No scleral pallor or icterus. Pupils equally reactive to light. Oral mucosa is moist NECK: is supple, no palpable thyroid enlargement. CHEST: Diminished breath sounds bilaterally, mild rhonchi. CVS: S1 and S2 heard, no murmur. Regular rate and rhythm. No pericardial rub. ABDOMEN: Soft, non-tender, bowel sounds are present. No palpable hepato-splenomegaly. EXTREMITIES: Left knee sutures from recent laceration. CNS: Cranial nerves are intact. No focal motor or sensory deficits. SKIN: warm and dry without rashes.  Data Review: I have personally reviewed the following laboratory data and studies,  CBC: Recent Labs  Lab 01/30/19 2338 01/30/19 2342 01/31/19 0430 02/01/19 0221  WBC  --  15.6* 7.7 8.0  NEUTROABS  --  13.7*  --   --   HGB 9.5* 9.0* 8.8* 8.0*  HCT 28.0* 29.5* 28.6* 25.9*  MCV  --  87.3 85.1 84.6  PLT  --  418* 361 160   Basic Metabolic Panel: Recent Labs  Lab 01/30/19 2338 01/30/19 2342 01/31/19 0430 02/01/19 0221  NA 141 138 138 138  K 3.3* 3.3* 3.5 3.8  CL  --  108 108 107  CO2  --  20* 18* 23  GLUCOSE  --  113* 213* 125*  BUN  --  8 6 10   CREATININE  --  0.82 0.74 0.65  CALCIUM  --  8.7* 8.4* 8.5*   Liver Function Tests: Recent Labs  Lab 01/30/19 2342 01/31/19  0430  AST 14* 12*  ALT 11 11  ALKPHOS 115 117  BILITOT 0.2* 0.2*  PROT 5.9* 6.1*  ALBUMIN 2.7* 2.9*   No results for input(s): LIPASE, AMYLASE in the last 168 hours. No results for input(s): AMMONIA in the last 168 hours. Cardiac Enzymes: No results for input(s): CKTOTAL, CKMB, CKMBINDEX, TROPONINI in the last 168 hours. BNP (last 3 results) Recent Labs    01/30/19 2343  BNP 190.6*    ProBNP (last 3 results) No results for input(s): PROBNP in the last 8760 hours.  CBG: No results for input(s): GLUCAP in the last 168 hours. Recent Results (from the past 240 hour(s))  SARS Coronavirus 2 (CEPHEID- Performed in [facility] lab), Hosp Order     Status: None   Collection Time: 01/30/19 11:27 PM   Specimen: Nasopharyngeal Swab  Result Value Ref Range Status   SARS Coronavirus 2 NEGATIVE NEGATIVE Final  Comment: (NOTE) If result is NEGATIVE SARS-CoV-2 target nucleic acids are NOT DETECTED. The SARS-CoV-2 RNA is generally detectable in upper and lower  respiratory specimens during the acute phase of infection. The lowest  concentration of SARS-CoV-2 viral copies this assay can detect is 250  copies / mL. A negative result does not preclude SARS-CoV-2 infection  and should not be used as the sole basis for treatment or other  patient management decisions.  A negative result may occur with  improper specimen collection / handling, submission of specimen other  than nasopharyngeal swab, presence of viral mutation(s) within the  areas targeted by this assay, and inadequate number of viral copies  (<250 copies / mL). A negative result must be combined with clinical  observations, patient history, and epidemiological information. If result is POSITIVE SARS-CoV-2 target nucleic acids are DETECTED. The SARS-CoV-2 RNA is generally detectable in upper and lower  respiratory specimens dur ing the acute phase of infection.  Positive  results are indicative of active infection  with SARS-CoV-2.  Clinical  correlation with patient history and other diagnostic information is  necessary to determine patient infection status.  Positive results do  not rule out bacterial infection or co-infection with other viruses. If result is PRESUMPTIVE POSTIVE SARS-CoV-2 nucleic acids MAY BE PRESENT.   A presumptive positive result was obtained on the submitted specimen  and confirmed on repeat testing.  While 2019 novel coronavirus  (SARS-CoV-2) nucleic acids may be present in the submitted sample  additional confirmatory testing may be necessary for epidemiological  and / or clinical management purposes  to differentiate between  SARS-CoV-2 and other Sarbecovirus currently known to infect humans.  If clinically indicated additional testing with an alternate test  methodology [phone]) is advised. The SARS-CoV-2 RNA is generally  detectable in upper and lower respiratory sp ecimens during the acute  phase of infection. The expected result is Negative. Fact Sheet for Patients:  BoilerBrush.com.cyhttps://www.fda.gov/media/136312/download Fact Sheet for Healthcare Providers: https://pope.com/https://www.fda.gov/media/136313/download This test is not yet approved or cleared by the [place] FDA and has been authorized for detection and/or diagnosis of SARS-CoV-2 by FDA under an Emergency Use Authorization (EUA).  This EUA will remain in effect (meaning this test can be used) for the duration of the COVID-19 declaration under Section 564(b)(1) of the Act, 21 U.S.C. section 360bbb-3(b)(1), unless the authorization is terminated or revoked sooner. Performed at [facility], [address] [place], [place] [address]   Culture, blood (routine x 2) Call MD if unable to obtain prior to antibiotics being given     Status: None (Preliminary result)   Collection Time: 01/31/19  5:56 PM   Specimen: BLOOD LEFT FOREARM  Result Value Ref Range Status   Specimen Description BLOOD LEFT FOREARM  Final   Special  Requests AEROBIC BOTTLE ONLY Blood Culture adequate volume  Final   Culture   Final    NO GROWTH < 12 HOURS Performed at [facility], [address] [place], [place] [address]    Report Status PENDING  Incomplete  Culture, blood (routine x 2) Call MD if unable to obtain prior to antibiotics being given     Status: None (Preliminary result)   Collection Time: 01/31/19  5:56 PM   Specimen: BLOOD RIGHT HAND  Result Value Ref Range Status   Specimen Description BLOOD RIGHT HAND  Final   Special Requests Blood Culture adequate volume  Final   Culture   Final    NO GROWTH <  12 HOURS Performed at [facility], [address] [place], [place] [address]    Report Status PENDING  Incomplete     Studies: Dg Chest Portable 1 View  Result Date: 01/30/2019 CLINICAL DATA:  58 year old female with shortness of breath. EXAM: PORTABLE CHEST 1 VIEW COMPARISON:  Chest radiograph dated 12/25/2018 FINDINGS: Left lung base hazy density may represent combination of small pleural effusion and associated atelectasis versus an area of developing infiltrate. Clinical correlation is recommended. The right lung is clear. There is no pneumothorax. The cardiac silhouette is within normal limits. No acute osseous pathology. IMPRESSION: Small left pleural effusion and left lung base atelectasis versus developing infiltrate. Electronically Signed   By: Elgie CollardArash  Radparvar M.D.   On: 01/30/2019 23:10    Scheduled Meds: . atorvastatin  80 mg Oral Daily  . buPROPion  150 mg Oral Daily  . clopidogrel  75 mg Oral Daily  . DULoxetine  60 mg Oral Daily  . enoxaparin (LOVENOX) injection  40 mg Subcutaneous Q24H  . famotidine  20 mg Oral BID  . fluticasone furoate-vilanterol  1 puff Inhalation Daily  . gabapentin  300 mg Oral QHS  . isosorbide mononitrate  15 mg Oral Daily  . levothyroxine  125 mcg Oral Daily  . lisinopril  5 mg Oral Daily  . lurasidone  30 mg Oral Daily  . methylPREDNISolone (SOLU-MEDROL)  injection  40 mg Intravenous Q12H  . metoprolol succinate  25 mg Oral Daily  . nicotine  21 mg Transdermal Daily  . oxyCODONE  15 mg Oral TID  . pantoprazole  40 mg Oral Daily  . umeclidinium bromide  1 puff Inhalation Daily    Continuous Infusions: . sodium chloride 500 mL (01/31/19 2225)  . azithromycin 500 mg (01/31/19 2229)  . cefTRIAXone (ROCEPHIN)  IV 2 g (01/31/19 2227)     Joycelyn DasLaxman Sukhmani Fetherolf, MD  [facility] 02/01/2019

## 2019-02-01 NOTE — Progress Notes (Signed)
Upon Solumedrol med administration patient wheezing and stated felt like she smoked 2 packs of cigarettes. Called resp at [phone] and spoke with Josh, notified of patient wheezing and SOB and requested PRN Albuterol nebulizer. Nebulizer given at [phone]

## 2019-02-02 LAB — BASIC METABOLIC PANEL
Anion gap: 9 (ref 5–15)
BUN: 19 mg/dL (ref 6–20)
CO2: 28 mmol/L (ref 22–32)
Calcium: 8.6 mg/dL — ABNORMAL LOW (ref 8.9–10.3)
Chloride: 103 mmol/L (ref 98–111)
Creatinine, Ser: 0.74 mg/dL (ref 0.44–1.00)
GFR calc Af Amer: >60 mL/min (ref >60)
GFR calc non Af Amer: >60 mL/min (ref >60)
Glucose, Bld: 99 mg/dL (ref 70–99)
Potassium: 3.6 mmol/L (ref 3.5–5.1)
Sodium: 140 mmol/L (ref 135–145)

## 2019-02-02 LAB — CBC
HCT: 28.3 % — ABNORMAL LOW (ref 36.0–46.0)
Hemoglobin: 8.9 g/dL — ABNORMAL LOW (ref 12.0–15.0)
MCH: 26.6 pg (ref 26.0–34.0)
MCHC: 31.4 g/dL (ref 30.0–36.0)
MCV: 84.7 fL (ref 80.0–100.0)
Platelets: 431 10*3/uL — ABNORMAL HIGH (ref 150–400)
RBC: 3.34 MIL/uL — ABNORMAL LOW (ref 3.87–5.11)
RDW: 15.7 % — ABNORMAL HIGH (ref 11.5–15.5)
WBC: 8.2 10*3/uL (ref 4.0–10.5)
nRBC: 0 % (ref 0.0–0.2)

## 2019-02-02 LAB — MAGNESIUM: Magnesium: 2.1 mg/dL (ref 1.7–2.4)

## 2019-02-02 MED ORDER — PREDNISONE 10 MG (21) PO TBPK: ORAL_TABLET | ORAL | 0 refills | Status: DC

## 2019-02-02 MED ORDER — NICOTINE 21 MG/24HR TD PT24: 21.0000 mg | MEDICATED_PATCH | Freq: Every day | TRANSDERMAL | 0 refills | Status: DC

## 2019-02-02 MED ORDER — GUAIFENESIN-DM 100-10 MG/5ML PO SYRP: 5.0000 mL | ORAL_SOLUTION | ORAL | 0 refills | Status: DC | PRN

## 2019-02-02 MED ORDER — LEVOFLOXACIN 750 MG PO TABS: 750.0000 mg | ORAL_TABLET | Freq: Every day | ORAL | 0 refills | Status: AC

## 2019-02-02 NOTE — Progress Notes (Signed)
SATURATION QUALIFICATIONS: (This note is used to comply with regulatory documentation for home oxygen)  Patient Saturations on Room Air at Rest = 96%  Patient Saturations on Room Air while Ambulating = 97%  Patient Saturations on 2 Liters of oxygen while Ambulating = 99%  Please briefly explain why patient needs home oxygen:  Easily fatigued but not in need of home O2 per assessment.

## 2019-02-02 NOTE — Progress Notes (Signed)
Pt seen by MD, orders written for discharge. RN went over discharge instructions with pt and answered all questions. RN removed IV's. Escorted for discharge via wheelchair with all belongings. Pt will follow up outpatient with MD.  

## 2019-02-02 NOTE — Discharge Summary (Signed)
Physician Discharge Summary  Erin Lynch ZOX:[id number] DOB: 06/09/1961 DOA: 01/30/2019  PCP: Clelia CroftGeorge, Robert, MD  Admit date: 01/30/2019 Discharge date: 02/02/2019  Admitted From: Home  Discharge disposition: Home   Recommendations for Outpatient Follow-Up:    Follow up with your primary care provider in one week.   Discharge Diagnosis:   Principal Problem:   Pneumonia Active Problems:   Hyperlipidemia   Bipolar disorder, curr episode depressed, severe, w/psychotic features (HCC)   Coronary artery disease involving native coronary artery COPD   Discharge Condition: Improved.  Diet recommendation: Low sodium, heart healthy.    Wound care: None.  Code status: Full.   History of Present Illness:   Erin DikesJoan Turneris a 58 y.o.femalewith medical history significant ofrecently diagnosed COPD, coronary artery disease, osteoarthritis, chronic pain syndrome, chronic diastolic CHF, fibromyalgia, hyperlipidemia, anxiety disorder presented to the hospital with shortness of breath cough and wheezing. She has been having significant cough. No fever or chills. She was evaluated in the ER and found to have pneumonia with COPD exacerbation. Patient being admitted for treatment.  Her initial vitals were, temperature 97.5 blood pressure 160/89 pulse 81 respiratory 31 oxygen sats 94% room air. White count is 15.6 hemoglobin 9.0 platelet count of 418. Sodium 138 potassium 3.3 chloride 108 CO2 20 BUN 8 creatinine 0.82 calcium 8.7. Urinalysis negative.BNP 190.6.    Hospital Course:  Following conditions were addressed during hospitalization,  Community-acquired pneumonia with acute exacerbation of COPD.  Patient was given Rocephin and Zithromax hospitalization and will be given 3 more days of Levaquin on discharge to complete the course of antibiotic.   Patient was also continued on oxygen nebulizer and steroids during hospitalization.  She was subsequently weaned off the oxygen. Chest  x-ray showing small left pleural effusion and left lung base atelectasis versus developing infiltrate.Patient will need outpatient follow-up chest x-ray with her primary care physician during her outpatient visit.  Patient continued with similar symptoms  May of this year and I think it is likely is related to heavy cigarette smoking..  Blood cultures  were negative in 2 days.  COVID19 test was negative.  Patient remained afebrile.  No leukocytosis today.  Strep urinary antigen was negative.  HIV was negative.  ABG was reviewed.  BNP was 190  Hypokalemia mild improved with replacement.  Acute hypoxic respiratory failure possibly secondary to COPD exacerbation.  Patient does have history of CAD in the past.   2D echocardiogram with preserved LV function.    acute exacerbation of COPD: on IV steroid, antibiotic and continue with breathing treatment.  Continue nicotine patch and incentive spirometer.  coronary artery disease: stable. Did not have any chest pain.  Resume home medication.  bipolar disorder with anxiety:Stable.  Patient is on multiple outpatient medication. Continue outpatient medication  leukocytosis:Most likely due to pneumonia.    Resolved  normocytic anemia:H&H stable.  No evidence of bleeding.  chronic pain syndrome:Pain was under control while in the hospital.  Chronic heavy tobacco smoker.    Patient was extensively counseled about it.  We will continue on nicotine patch.   Code Status: Full code  Disposition :  At this time, patient is a stable for disposition home.  She was weaned off the oxygen and has ambulated.   Patient was advised to follow-up with her primary care physician within 1 week.  She was strongly emphasized on quitting smoking   Medical Consultants:    None.   Subjective:   Today, patient feels overall better.  Patient  has ambulated without need for oxygen.  Has mild cough.  Discharge Exam:   Vitals:   02/02/19 0730  02/02/19 1143  BP:  140/81  Pulse: 65 68  Resp: 18 18  Temp:  98 F (36.7 C)  SpO2: 96% 97%   Vitals:   02/01/19 2123 02/02/19 0519 02/02/19 0730 02/02/19 1143  BP: 121/64 (!) 150/83  140/81  Pulse: 76 (!) 57 65 68  Resp: Temp: 97.8 F (36.6 C) 98.1 F (36.7 C)  98 F (36.7 C)  TempSrc:      SpO2: 95% 95% 96% 97%  Weight:      Height:        General exam: Appears calm and comfortable ,Not in distress HEENT:PERRL,Oral mucosa moist Respiratory system: Bilateral equal air entry, normal vesicular breath sounds, no wheezes or crackles  Cardiovascular system: S1 & S2 heard, RRR.  Gastrointestinal system: Abdomen is nondistended, soft and nontender. No organomegaly or masses felt. Normal bowel sounds heard. Central nervous system: Alert and oriented. No focal neurological deficits. Extremities: No edema, no clubbing ,no cyanosis, distal peripheral pulses palpable. Skin: No rashes, lesions or ulcers,no icterus ,no pallor MSK: Normal muscle bulk,tone ,power    Procedures:    None  The results of significant diagnostics from this hospitalization (including imaging, microbiology, ancillary and laboratory) are listed below for reference.     Diagnostic Studies:   Dg Chest Portable 1 View  Result Date: 01/30/2019 CLINICAL DATA:  58 year old female with shortness of breath. EXAM: PORTABLE CHEST 1 VIEW COMPARISON:  Chest radiograph dated 12/25/2018 FINDINGS: Left lung base hazy density may represent combination of small pleural effusion and associated atelectasis versus an area of developing infiltrate. Clinical correlation is recommended. The right lung is clear. There is no pneumothorax. The cardiac silhouette is within normal limits. No acute osseous pathology. IMPRESSION: Small left pleural effusion and left lung base atelectasis versus developing infiltrate. Electronically Signed   By: Elgie Collard M.D.   On: 01/30/2019 23:10     Labs:   Basic Metabolic  Panel: Recent Labs  Lab 01/30/19 2338 01/30/19 2342 01/31/19 0430 02/01/19 0221 02/02/19 0305  NA 141 138 138 138 140  K 3.3* 3.3* 3.5 3.8 3.6  CL  --  108 108 107 103  CO2  --  20* 18* 23 28  GLUCOSE  --  113* 213* 125* 99  BUN  --  CREATININE  --  0.82 0.74 0.65 0.74  CALCIUM  --  8.7* 8.4* 8.5* 8.6*  MG  --   --   --   --  2.1   GFR Estimated Creatinine Clearance: 76 mL/min (by C-G formula based on SCr of 0.74 mg/dL). Liver Function Tests: Recent Labs  Lab 01/30/19 2342 01/31/19 0430  AST 14* 12*  ALT 11 11  ALKPHOS 115 117  BILITOT 0.2* 0.2*  PROT 5.9* 6.1*  ALBUMIN 2.7* 2.9*   No results for input(s): LIPASE, AMYLASE in the last 168 hours. No results for input(s): AMMONIA in the last 168 hours. Coagulation profile No results for input(s): INR, PROTIME in the last 168 hours.  CBC: Recent Labs  Lab 01/30/19 2338 01/30/19 2342 01/31/19 0430 02/01/19 0221 02/02/19 0305  WBC  --  15.6* 7.7 8.0 8.2  NEUTROABS  --  13.7*  --   --   --   HGB 9.5* 9.0* 8.8* 8.0* 8.9*  HCT 28.0* 29.5* 28.6* 25.9* 28.3*  MCV  --  87.3 85.1 84.6 84.7  PLT  --  418* 361 376 431*   Cardiac Enzymes: No results for input(s): CKTOTAL, CKMB, CKMBINDEX, TROPONINI in the last 168 hours. BNP: Invalid input(s): POCBNP CBG: No results for input(s): GLUCAP in the last 168 hours. D-Dimer No results for input(s): DDIMER in the last 72 hours. Hgb A1c No results for input(s): HGBA1C in the last 72 hours. Lipid Profile No results for input(s): CHOL, HDL, LDLCALC, TRIG, CHOLHDL, LDLDIRECT in the last 72 hours. Thyroid function studies No results for input(s): TSH, T4TOTAL, T3FREE, THYROIDAB in the last 72 hours.  Invalid input(s): FREET3 Anemia work up No results for input(s): VITAMINB12, FOLATE, FERRITIN, TIBC, IRON, RETICCTPCT in the last 72 hours. Microbiology Recent Results (from the past 240 hour(s))  SARS Coronavirus 2 (CEPHEID- Performed in [facility] hospital lab),  Hosp Order     Status: None   Collection Time: 01/30/19 11:27 PM   Specimen: Nasopharyngeal Swab  Result Value Ref Range Status   SARS Coronavirus 2 NEGATIVE NEGATIVE Final    Comment: (NOTE) If result is NEGATIVE SARS-CoV-2 target nucleic acids are NOT DETECTED. The SARS-CoV-2 RNA is generally detectable in upper and lower  respiratory specimens during the acute phase of infection. The lowest  concentration of SARS-CoV-2 viral copies this assay can detect is 250  copies / mL. A negative result does not preclude SARS-CoV-2 infection  and should not be used as the sole basis for treatment or other  patient management decisions.  A negative result may occur with  improper specimen collection / handling, submission of specimen other  than nasopharyngeal swab, presence of viral mutation(s) within the  areas targeted by this assay, and inadequate number of viral copies  (<250 copies / mL). A negative result must be combined with clinical  observations, patient history, and epidemiological information. If result is POSITIVE SARS-CoV-2 target nucleic acids are DETECTED. The SARS-CoV-2 RNA is generally detectable in upper and lower  respiratory specimens dur ing the acute phase of infection.  Positive  results are indicative of active infection with SARS-CoV-2.  Clinical  correlation with patient history and other diagnostic information is  necessary to determine patient infection status.  Positive results do  not rule out bacterial infection or co-infection with other viruses. If result is PRESUMPTIVE POSTIVE SARS-CoV-2 nucleic acids MAY BE PRESENT.   A presumptive positive result was obtained on the submitted specimen  and confirmed on repeat testing.  While 2019 novel coronavirus  (SARS-CoV-2) nucleic acids may be present in the submitted sample  additional confirmatory testing may be necessary for epidemiological  and / or clinical management purposes  to differentiate between    SARS-CoV-2 and other Sarbecovirus currently known to infect humans.  If clinically indicated additional testing with an alternate test  methodology [phone]([id number]) is advised. The SARS-CoV-2 RNA is generally  detectable in upper and lower respiratory sp ecimens during the acute  phase of infection. The expected result is Negative. Fact Sheet for Patients:  BoilerBrush.com.cyhttps://www.fda.gov/media/136312/download Fact Sheet for Healthcare Providers: https://pope.com/https://www.fda.gov/media/136313/download This test is not yet approved or cleared by the [place] FDA and has been authorized for detection and/or diagnosis of SARS-CoV-2 by FDA under an Emergency Use Authorization (EUA).  This EUA will remain in effect (meaning this test can be used) for the duration of the COVID-19 declaration under Section 564(b)(1) of the Act, 21 U.S.C. section 360bbb-3(b)(1), unless the authorization is terminated or revoked sooner. Performed at [facility], [address] [place],  [place] [id number]   Culture, blood (routine x 2) Call MD if unable to obtain prior to antibiotics being given     Status: None (Preliminary result)   Collection Time: 01/31/19  5:56 PM   Specimen: BLOOD LEFT FOREARM  Result Value Ref Range Status   Specimen Description BLOOD LEFT FOREARM  Final   Special Requests AEROBIC BOTTLE ONLY Blood Culture adequate volume  Final   Culture   Final    NO GROWTH 2 DAYS Performed at [facility], [address] [place], [place] [address]    Report Status PENDING  Incomplete  Culture, blood (routine x 2) Call MD if unable to obtain prior to antibiotics being given     Status: None (Preliminary result)   Collection Time: 01/31/19  5:56 PM   Specimen: BLOOD RIGHT HAND  Result Value Ref Range Status   Specimen Description BLOOD RIGHT HAND  Final   Special Requests Blood Culture adequate volume  Final   Culture   Final    NO GROWTH 2 DAYS Performed at [facility], [address]  [place], [place] [address]    Report Status PENDING  Incomplete     Discharge Instructions:   Discharge Instructions    Call MD for:  temperature >100.4   Complete by: As directed    Diet - low sodium heart healthy   Complete by: As directed    Discharge instructions   Complete by: As directed    Please complete the course of antibiotic.  Follow-up with your primary care physician within 1 week.  Please do not to smoke.  You have been prescribed nicotine patch to help you quit smoking.   Increase activity slowly   Complete by: As directed            Allergies as of 02/02/2019   No Known Allergies     Medication List    TAKE these medications   ALPRAZolam 1 MG tablet Commonly known as: XANAX Take 1 mg by mouth 3 (three) times daily as needed for anxiety.   aspirin 81 MG EC tablet Take 1 tablet (81 mg total) by mouth daily.   atorvastatin 80 MG tablet Commonly known as: LIPITOR Take 80 mg by mouth daily.   buPROPion 150 MG 12 hr tablet Commonly known as: WELLBUTRIN SR Take 150 mg by mouth daily.   clopidogrel 75 MG tablet Commonly known as: PLAVIX TAKE 1 TABLET BY MOUTH EVERY DAY   DULoxetine 60 MG capsule Commonly known as: CYMBALTA Take 60 mg by mouth daily.   gabapentin 300 MG capsule Commonly known as: NEURONTIN Take 300-900 mg by mouth at bedtime.   guaiFENesin-dextromethorphan 100-10 MG/5ML syrup Commonly known as: ROBITUSSIN DM Take 5 mLs by mouth every 4 (four) hours as needed for cough.   isosorbide mononitrate 30 MG 24 hr tablet Commonly known as: IMDUR TAKE 0.5 TABLETS (15 MG TOTAL) BY MOUTH DAILY.   Latuda 60 MG Tabs Generic drug: Lurasidone HCl Take 40 mg by mouth daily.   levofloxacin 750 MG tablet Commonly known as: Levaquin Take 1 tablet (750 mg total) by mouth daily for 3 days.   levothyroxine 125 MCG tablet Commonly known as: SYNTHROID Take 125 mcg by mouth daily.   lisinopril 5 MG tablet Commonly known as: ZESTRIL Take 5 mg by  mouth daily.   metoprolol succinate 25 MG 24 hr tablet Commonly known as: TOPROL-XL Take 25 mg by mouth daily.   nicotine 21 mg/24hr patch  Commonly known as: NICODERM CQ - dosed in mg/24 hours Place 1 patch (21 mg total) onto the skin daily. Start taking on: February 03, 2019   nitroGLYCERIN 0.4 MG SL tablet Commonly known as: Nitrostat Place 1 tablet (0.4 mg total) under the tongue every 5 (five) minutes as needed for chest pain.   omeprazole 40 MG capsule Commonly known as: PRILOSEC Take 40 mg by mouth daily.   oxyCODONE 5 MG immediate release tablet Commonly known as: Oxy IR/ROXICODONE Take 15 mg by mouth 3 (three) times daily.   predniSONE 10 MG (21) Tbpk tablet Commonly known as: STERAPRED UNI-PAK 21 TAB 3 tab orally daily x 2 days then 2 tab po daily x 2 days then 1 tab po daily x 2 days   ranitidine 300 MG tablet Commonly known as: ZANTAC Take 1 tablet (300 mg total) by mouth at bedtime.   Trelegy Ellipta 100-62.5-25 MCG/INH Aepb Generic drug: Fluticasone-Umeclidin-Vilant Inhale 1 puff into the lungs daily.   Ventolin HFA 108 (90 Base) MCG/ACT inhaler Generic drug: albuterol Inhale 2 puffs into the lungs every 6 (six) hours as needed for shortness of breath.   zolpidem 10 MG tablet Commonly known as: AMBIEN Take 10 mg by mouth at bedtime as needed for sleep.       Time coordinating discharge: 39 minutes  Signed:  Santresa Levett  [facility] 02/02/2019, 1:01 PM

## 2019-02-05 LAB — CULTURE, BLOOD (ROUTINE X 2)
Culture: NO GROWTH
Culture: NO GROWTH
Special Requests: ADEQUATE
Special Requests: ADEQUATE

## 2019-04-22 ENCOUNTER — Other Ambulatory Visit: Payer: Self-pay

## 2019-04-22 ENCOUNTER — Emergency Department (HOSPITAL_COMMUNITY)
Admission: EM | Admit: 2019-04-22 | Discharge: 2019-04-22 | Disposition: A | Discharge status: Home / Self Care | Payer: Medicare HMO | Attending: Emergency Medicine | Admitting: Emergency Medicine

## 2019-04-22 ENCOUNTER — Encounter (HOSPITAL_COMMUNITY): Payer: Self-pay

## 2019-04-22 DIAGNOSIS — R0602 Shortness of breath: Secondary | ICD-10-CM | POA: Diagnosis present

## 2019-04-22 DIAGNOSIS — Z5321 Procedure and treatment not carried out due to patient leaving prior to being seen by health care provider: Secondary | ICD-10-CM | POA: Diagnosis not present

## 2019-04-22 NOTE — ED Notes (Signed)
Patient reports she is leaving. EMS IV removed. Speaking in full sentences and ambulatory without difficulty in NAD.

## 2019-04-22 NOTE — ED Triage Notes (Signed)
EMS reports from home, called out for SOB, Hx of COPD. Pt admits to currently smoking.  BP 128/78 HR 76 RR 26 Sp02 96 RA CBG 128 Temp 98.4

## 2019-10-25 DIAGNOSIS — G4700 Insomnia, unspecified: Secondary | ICD-10-CM | POA: Diagnosis not present

## 2019-10-25 DIAGNOSIS — F39 Unspecified mood [affective] disorder: Secondary | ICD-10-CM | POA: Diagnosis not present

## 2019-10-25 DIAGNOSIS — R69 Illness, unspecified: Secondary | ICD-10-CM | POA: Diagnosis not present

## 2019-10-25 DIAGNOSIS — F419 Anxiety disorder, unspecified: Secondary | ICD-10-CM | POA: Diagnosis not present

## 2019-11-01 DIAGNOSIS — K219 Gastro-esophageal reflux disease without esophagitis: Secondary | ICD-10-CM | POA: Diagnosis not present

## 2019-11-01 DIAGNOSIS — E782 Mixed hyperlipidemia: Secondary | ICD-10-CM | POA: Diagnosis not present

## 2019-11-01 DIAGNOSIS — I10 Essential (primary) hypertension: Secondary | ICD-10-CM | POA: Diagnosis not present

## 2019-11-01 DIAGNOSIS — E038 Other specified hypothyroidism: Secondary | ICD-10-CM | POA: Diagnosis not present

## 2019-11-04 DIAGNOSIS — E782 Mixed hyperlipidemia: Secondary | ICD-10-CM | POA: Diagnosis not present

## 2019-11-04 DIAGNOSIS — E038 Other specified hypothyroidism: Secondary | ICD-10-CM | POA: Diagnosis not present

## 2019-11-04 DIAGNOSIS — I10 Essential (primary) hypertension: Secondary | ICD-10-CM | POA: Diagnosis not present

## 2019-11-04 DIAGNOSIS — M150 Primary generalized (osteo)arthritis: Secondary | ICD-10-CM | POA: Diagnosis not present

## 2019-11-05 DIAGNOSIS — J449 Chronic obstructive pulmonary disease, unspecified: Secondary | ICD-10-CM | POA: Diagnosis not present

## 2019-11-17 DIAGNOSIS — J449 Chronic obstructive pulmonary disease, unspecified: Secondary | ICD-10-CM | POA: Diagnosis not present

## 2019-11-29 DIAGNOSIS — M150 Primary generalized (osteo)arthritis: Secondary | ICD-10-CM | POA: Diagnosis not present

## 2019-11-29 DIAGNOSIS — E038 Other specified hypothyroidism: Secondary | ICD-10-CM | POA: Diagnosis not present

## 2019-11-29 DIAGNOSIS — I10 Essential (primary) hypertension: Secondary | ICD-10-CM | POA: Diagnosis not present

## 2019-11-29 DIAGNOSIS — K219 Gastro-esophageal reflux disease without esophagitis: Secondary | ICD-10-CM | POA: Diagnosis not present

## 2019-11-29 DIAGNOSIS — E782 Mixed hyperlipidemia: Secondary | ICD-10-CM | POA: Diagnosis not present

## 2019-11-29 DIAGNOSIS — J449 Chronic obstructive pulmonary disease, unspecified: Secondary | ICD-10-CM | POA: Diagnosis not present

## 2019-12-05 DIAGNOSIS — J449 Chronic obstructive pulmonary disease, unspecified: Secondary | ICD-10-CM | POA: Diagnosis not present

## 2019-12-20 DIAGNOSIS — J449 Chronic obstructive pulmonary disease, unspecified: Secondary | ICD-10-CM | POA: Diagnosis not present

## 2019-12-27 DIAGNOSIS — K219 Gastro-esophageal reflux disease without esophagitis: Secondary | ICD-10-CM | POA: Diagnosis not present

## 2019-12-27 DIAGNOSIS — D518 Other vitamin B12 deficiency anemias: Secondary | ICD-10-CM | POA: Diagnosis not present

## 2019-12-27 DIAGNOSIS — E782 Mixed hyperlipidemia: Secondary | ICD-10-CM | POA: Diagnosis not present

## 2019-12-27 DIAGNOSIS — E119 Type 2 diabetes mellitus without complications: Secondary | ICD-10-CM | POA: Diagnosis not present

## 2019-12-27 DIAGNOSIS — J449 Chronic obstructive pulmonary disease, unspecified: Secondary | ICD-10-CM | POA: Diagnosis not present

## 2019-12-27 DIAGNOSIS — D519 Vitamin B12 deficiency anemia, unspecified: Secondary | ICD-10-CM | POA: Diagnosis not present

## 2019-12-27 DIAGNOSIS — I10 Essential (primary) hypertension: Secondary | ICD-10-CM | POA: Diagnosis not present

## 2019-12-27 DIAGNOSIS — E559 Vitamin D deficiency, unspecified: Secondary | ICD-10-CM | POA: Diagnosis not present

## 2019-12-27 DIAGNOSIS — Z79899 Other long term (current) drug therapy: Secondary | ICD-10-CM | POA: Diagnosis not present

## 2019-12-27 DIAGNOSIS — M150 Primary generalized (osteo)arthritis: Secondary | ICD-10-CM | POA: Diagnosis not present

## 2019-12-27 DIAGNOSIS — N951 Menopausal and female climacteric states: Secondary | ICD-10-CM | POA: Diagnosis not present

## 2019-12-27 DIAGNOSIS — E038 Other specified hypothyroidism: Secondary | ICD-10-CM | POA: Diagnosis not present

## 2020-01-05 DIAGNOSIS — J449 Chronic obstructive pulmonary disease, unspecified: Secondary | ICD-10-CM | POA: Diagnosis not present

## 2020-02-04 DIAGNOSIS — J449 Chronic obstructive pulmonary disease, unspecified: Secondary | ICD-10-CM | POA: Diagnosis not present

## 2020-02-08 DIAGNOSIS — B372 Candidiasis of skin and nail: Secondary | ICD-10-CM | POA: Diagnosis not present

## 2020-02-08 DIAGNOSIS — E039 Hypothyroidism, unspecified: Secondary | ICD-10-CM | POA: Diagnosis not present

## 2020-02-08 DIAGNOSIS — I2510 Atherosclerotic heart disease of native coronary artery without angina pectoris: Secondary | ICD-10-CM | POA: Diagnosis not present

## 2020-02-08 DIAGNOSIS — M797 Fibromyalgia: Secondary | ICD-10-CM | POA: Diagnosis not present

## 2020-02-08 DIAGNOSIS — R21 Rash and other nonspecific skin eruption: Secondary | ICD-10-CM | POA: Diagnosis not present

## 2020-02-08 DIAGNOSIS — I110 Hypertensive heart disease with heart failure: Secondary | ICD-10-CM | POA: Diagnosis not present

## 2020-02-08 DIAGNOSIS — I509 Heart failure, unspecified: Secondary | ICD-10-CM | POA: Diagnosis not present

## 2020-02-08 DIAGNOSIS — R69 Illness, unspecified: Secondary | ICD-10-CM | POA: Diagnosis not present

## 2020-02-08 DIAGNOSIS — Z955 Presence of coronary angioplasty implant and graft: Secondary | ICD-10-CM | POA: Diagnosis not present

## 2020-02-08 DIAGNOSIS — J449 Chronic obstructive pulmonary disease, unspecified: Secondary | ICD-10-CM | POA: Diagnosis not present

## 2020-02-08 DIAGNOSIS — E785 Hyperlipidemia, unspecified: Secondary | ICD-10-CM | POA: Diagnosis not present

## 2020-02-21 DIAGNOSIS — K219 Gastro-esophageal reflux disease without esophagitis: Secondary | ICD-10-CM | POA: Diagnosis not present

## 2020-02-21 DIAGNOSIS — J449 Chronic obstructive pulmonary disease, unspecified: Secondary | ICD-10-CM | POA: Diagnosis not present

## 2020-02-21 DIAGNOSIS — M109 Gout, unspecified: Secondary | ICD-10-CM | POA: Diagnosis not present

## 2020-02-21 DIAGNOSIS — I10 Essential (primary) hypertension: Secondary | ICD-10-CM | POA: Diagnosis not present

## 2020-02-24 ENCOUNTER — Emergency Department (HOSPITAL_COMMUNITY): Payer: Medicare HMO

## 2020-02-24 ENCOUNTER — Other Ambulatory Visit: Payer: Self-pay

## 2020-02-24 ENCOUNTER — Encounter (HOSPITAL_COMMUNITY): Payer: Self-pay | Admitting: Emergency Medicine

## 2020-02-24 ENCOUNTER — Inpatient Hospital Stay (HOSPITAL_COMMUNITY)
Admission: EM | Admit: 2020-02-24 | Discharge: 2020-03-02 | DRG: 871 | Disposition: A | Discharge status: Home Health | Payer: Medicare HMO | Attending: Internal Medicine | Admitting: Internal Medicine

## 2020-02-24 DIAGNOSIS — G9341 Metabolic encephalopathy: Secondary | ICD-10-CM | POA: Diagnosis present

## 2020-02-24 DIAGNOSIS — I517 Cardiomegaly: Secondary | ICD-10-CM | POA: Diagnosis not present

## 2020-02-24 DIAGNOSIS — K219 Gastro-esophageal reflux disease without esophagitis: Secondary | ICD-10-CM | POA: Diagnosis present

## 2020-02-24 DIAGNOSIS — Z7902 Long term (current) use of antithrombotics/antiplatelets: Secondary | ICD-10-CM

## 2020-02-24 DIAGNOSIS — M797 Fibromyalgia: Secondary | ICD-10-CM | POA: Diagnosis present

## 2020-02-24 DIAGNOSIS — I2510 Atherosclerotic heart disease of native coronary artery without angina pectoris: Secondary | ICD-10-CM | POA: Diagnosis present

## 2020-02-24 DIAGNOSIS — F419 Anxiety disorder, unspecified: Secondary | ICD-10-CM | POA: Diagnosis present

## 2020-02-24 DIAGNOSIS — E872 Acidosis: Secondary | ICD-10-CM | POA: Diagnosis present

## 2020-02-24 DIAGNOSIS — J189 Pneumonia, unspecified organism: Secondary | ICD-10-CM | POA: Diagnosis present

## 2020-02-24 DIAGNOSIS — Z955 Presence of coronary angioplasty implant and graft: Secondary | ICD-10-CM

## 2020-02-24 DIAGNOSIS — J440 Chronic obstructive pulmonary disease with acute lower respiratory infection: Secondary | ICD-10-CM | POA: Diagnosis present

## 2020-02-24 DIAGNOSIS — E785 Hyperlipidemia, unspecified: Secondary | ICD-10-CM | POA: Diagnosis present

## 2020-02-24 DIAGNOSIS — F17210 Nicotine dependence, cigarettes, uncomplicated: Secondary | ICD-10-CM | POA: Diagnosis present

## 2020-02-24 DIAGNOSIS — R0689 Other abnormalities of breathing: Secondary | ICD-10-CM | POA: Diagnosis not present

## 2020-02-24 DIAGNOSIS — R918 Other nonspecific abnormal finding of lung field: Secondary | ICD-10-CM | POA: Diagnosis not present

## 2020-02-24 DIAGNOSIS — N179 Acute kidney failure, unspecified: Secondary | ICD-10-CM | POA: Diagnosis present

## 2020-02-24 DIAGNOSIS — G9340 Encephalopathy, unspecified: Secondary | ICD-10-CM | POA: Diagnosis not present

## 2020-02-24 DIAGNOSIS — I110 Hypertensive heart disease with heart failure: Secondary | ICD-10-CM | POA: Diagnosis present

## 2020-02-24 DIAGNOSIS — J441 Chronic obstructive pulmonary disease with (acute) exacerbation: Secondary | ICD-10-CM | POA: Diagnosis present

## 2020-02-24 DIAGNOSIS — J1520 Pneumonia due to staphylococcus, unspecified: Secondary | ICD-10-CM | POA: Diagnosis present

## 2020-02-24 DIAGNOSIS — Z781 Physical restraint status: Secondary | ICD-10-CM

## 2020-02-24 DIAGNOSIS — K449 Diaphragmatic hernia without obstruction or gangrene: Secondary | ICD-10-CM | POA: Diagnosis not present

## 2020-02-24 DIAGNOSIS — R42 Dizziness and giddiness: Secondary | ICD-10-CM | POA: Diagnosis not present

## 2020-02-24 DIAGNOSIS — Z452 Encounter for adjustment and management of vascular access device: Secondary | ICD-10-CM

## 2020-02-24 DIAGNOSIS — S0990XA Unspecified injury of head, initial encounter: Secondary | ICD-10-CM | POA: Diagnosis not present

## 2020-02-24 DIAGNOSIS — E861 Hypovolemia: Secondary | ICD-10-CM | POA: Diagnosis present

## 2020-02-24 DIAGNOSIS — A419 Sepsis, unspecified organism: Secondary | ICD-10-CM | POA: Diagnosis not present

## 2020-02-24 DIAGNOSIS — E876 Hypokalemia: Secondary | ICD-10-CM | POA: Diagnosis present

## 2020-02-24 DIAGNOSIS — R6521 Severe sepsis with septic shock: Secondary | ICD-10-CM | POA: Diagnosis present

## 2020-02-24 DIAGNOSIS — N170 Acute kidney failure with tubular necrosis: Secondary | ICD-10-CM | POA: Diagnosis present

## 2020-02-24 DIAGNOSIS — F315 Bipolar disorder, current episode depressed, severe, with psychotic features: Secondary | ICD-10-CM | POA: Diagnosis present

## 2020-02-24 DIAGNOSIS — R4182 Altered mental status, unspecified: Secondary | ICD-10-CM | POA: Diagnosis not present

## 2020-02-24 DIAGNOSIS — A4159 Other Gram-negative sepsis: Principal | ICD-10-CM | POA: Diagnosis present

## 2020-02-24 DIAGNOSIS — I5032 Chronic diastolic (congestive) heart failure: Secondary | ICD-10-CM | POA: Diagnosis present

## 2020-02-24 DIAGNOSIS — E871 Hypo-osmolality and hyponatremia: Secondary | ICD-10-CM | POA: Diagnosis present

## 2020-02-24 DIAGNOSIS — R0902 Hypoxemia: Secondary | ICD-10-CM | POA: Diagnosis not present

## 2020-02-24 DIAGNOSIS — G2581 Restless legs syndrome: Secondary | ICD-10-CM | POA: Diagnosis present

## 2020-02-24 DIAGNOSIS — R410 Disorientation, unspecified: Secondary | ICD-10-CM | POA: Diagnosis present

## 2020-02-24 DIAGNOSIS — J690 Pneumonitis due to inhalation of food and vomit: Secondary | ICD-10-CM | POA: Diagnosis present

## 2020-02-24 DIAGNOSIS — E039 Hypothyroidism, unspecified: Secondary | ICD-10-CM | POA: Diagnosis present

## 2020-02-24 DIAGNOSIS — Z20822 Contact with and (suspected) exposure to covid-19: Secondary | ICD-10-CM | POA: Diagnosis present

## 2020-02-24 DIAGNOSIS — D649 Anemia, unspecified: Secondary | ICD-10-CM | POA: Diagnosis present

## 2020-02-24 DIAGNOSIS — R0602 Shortness of breath: Secondary | ICD-10-CM | POA: Diagnosis not present

## 2020-02-24 DIAGNOSIS — I959 Hypotension, unspecified: Secondary | ICD-10-CM | POA: Diagnosis not present

## 2020-02-24 DIAGNOSIS — J9601 Acute respiratory failure with hypoxia: Secondary | ICD-10-CM | POA: Diagnosis present

## 2020-02-24 DIAGNOSIS — J181 Lobar pneumonia, unspecified organism: Secondary | ICD-10-CM | POA: Diagnosis not present

## 2020-02-24 LAB — I-STAT VENOUS BLOOD GAS, ED
Acid-base deficit: 5 mmol/L — ABNORMAL HIGH (ref 0.0–2.0)
Bicarbonate: 23 mmol/L (ref 20.0–28.0)
Calcium, Ion: 1.13 mmol/L — ABNORMAL LOW (ref 1.15–1.40)
HCT: 34 % — ABNORMAL LOW (ref 36.0–46.0)
Hemoglobin: 11.6 g/dL — ABNORMAL LOW (ref 12.0–15.0)
O2 Saturation: 96 %
Potassium: 2.7 mmol/L — CL (ref 3.5–5.1)
Sodium: 134 mmol/L — ABNORMAL LOW (ref 135–145)
TCO2: 25 mmol/L (ref 22–32)
pCO2, Ven: 52.3 mmHg (ref 44.0–60.0)
pH, Ven: 7.25 (ref 7.250–7.430)
pO2, Ven: 96 mmHg — ABNORMAL HIGH (ref 32.0–45.0)

## 2020-02-24 LAB — SARS CORONAVIRUS 2 BY RT PCR (HOSPITAL ORDER, PERFORMED IN ~~LOC~~ HOSPITAL LAB): SARS Coronavirus 2: NEGATIVE

## 2020-02-24 LAB — LACTIC ACID, PLASMA: Lactic Acid, Venous: 2.7 mmol/L (ref 0.5–1.9)

## 2020-02-24 MED ORDER — VANCOMYCIN HCL IN DEXTROSE 1-5 GM/200ML-% IV SOLN: 1000.0000 mg | Freq: Once | INTRAVENOUS | Status: DC

## 2020-02-24 MED ORDER — SODIUM CHLORIDE 0.9 % IV SOLN
100.0000 mg | Freq: Once | INTRAVENOUS | Status: AC
  Administered 2020-02-24: 100 mg via INTRAVENOUS
  Filled 2020-02-24 (×2): qty 100

## 2020-02-24 MED ORDER — NOREPINEPHRINE 4 MG/250ML-% IV SOLN
0.0000 ug/min | INTRAVENOUS | Status: DC
  Administered 2020-02-24: 5 ug/min via INTRAVENOUS
  Administered 2020-02-25 (×2): 17 ug/min via INTRAVENOUS
  Filled 2020-02-24 (×2): qty 250

## 2020-02-24 MED ORDER — LACTATED RINGERS IV BOLUS (SEPSIS)
1000.0000 mL | Freq: Once | INTRAVENOUS | Status: AC
  Administered 2020-02-24: 1000 mL via INTRAVENOUS

## 2020-02-24 MED ORDER — EPINEPHRINE 1 MG/10ML IJ SOSY
0.1000 mg | PREFILLED_SYRINGE | Freq: Once | INTRAMUSCULAR | Status: AC
  Administered 2020-02-24: 0.1 mg via INTRAVENOUS

## 2020-02-24 MED ORDER — LACTATED RINGERS IV SOLN: INTRAVENOUS | Status: AC

## 2020-02-24 MED ORDER — SODIUM CHLORIDE 0.9 % IV SOLN
1.0000 g | Freq: Once | INTRAVENOUS | Status: AC
  Administered 2020-02-24: 1 g via INTRAVENOUS
  Filled 2020-02-24: qty 10

## 2020-02-24 MED ORDER — FUROSEMIDE 10 MG/ML IJ SOLN
60.0000 mg | Freq: Once | INTRAMUSCULAR | Status: AC
  Administered 2020-02-24: 60 mg via INTRAVENOUS
  Filled 2020-02-24: qty 6

## 2020-02-24 NOTE — ED Notes (Signed)
Codee Bloodworth husband [phone] would like an update on the pt

## 2020-02-24 NOTE — ED Triage Notes (Addendum)
Pt presents to ED BIB [facility] from home. Pt c/c AMS. Per EMS pt LKW 0600 this morning. Husband found pt in chair when he got home this afternoon. Pt reports falling, unknown down time pt reports falling out of chair and hit head. Pt reports that she takes plavix. Pt found 87% RA, hypotensive w/ SBP 80's

## 2020-02-24 NOTE — ED Provider Notes (Signed)
[facility] EMERGENCY DEPARTMENT Provider Note   CSN: [id number] Arrival date & time: 02/24/20  1959     History CC: Fall, respiratory distress, confusion  Erin Lynch is a 59 y.o. female history of chronic diastolic heart failure, depression, coronary artery disease, hypothyroidism, hypertension, presented to emergency department with mechanical fall and hypoxia.  Patient reports that she lost her balance and fell today striking the back of her head on the ground.  She remembers doing so.  She is on the ground for a long period of time was able to get herself up into a chair.  Her husband found her at home when she is complaining shortness of breath.  EMS was called out to the house.  They noted it appeared she had vomited when they arrived.  She tells me she does not wear oxygen at home.  Here she is requiring 6 L nasal cannula.  She was hypoxic to 85% on room air upon arrival.  Patient is a current smoker.  She does report that she received both Covid vaccines.  Last echo was in July 2020, as below  IMPRESSIONS    1. The left ventricle has normal systolic function, with an ejection  fraction of 55-60%. The cavity size was normal. Left ventricular diastolic  Doppler parameters are consistent with impaired relaxation.  2. The right ventricle has normal systolic function. The cavity was  normal.  3. The mitral valve is abnormal. Mild thickening of the mitral valve  leaflet.  4. The tricuspid valve is grossly normal.  5. The aortic root is normal in size and structure.  6. Normal LV systolic function; mild diastolic dysfunction; moderate AI;  mild MR.   HPI     Past Medical History:  Diagnosis Date  . Anxiety   . Arthritis   . Bulging disc   . CAD (coronary artery disease)    a. NSTEMI 12/2011: BMS-LAD b. 09/2015: cath showing patent stent w/ mild nonobstructive CAD.  Marland Kitchen Chronic diastolic CHF (congestive heart failure) (HCC)   . Depression   .  Fibromyalgia   . GERD (gastroesophageal reflux disease)   . Hyperlipidemia LDL goal < 70   . Hypertension   . Hypothyroid   . Obesity (BMI 30.0-34.9)     Patient Active Problem List   Diagnosis Date Noted  . Septic shock (HCC) 02/25/2020  . Pneumonia 01/31/2019  . Coronary artery disease involving native coronary artery   . Unstable angina (HCC) 10/10/2015  . CAD (coronary artery disease) 01/14/2012  . Hyperlipidemia 01/14/2012  . Bipolar disorder, curr episode depressed, severe, w/psychotic features (HCC) 01/14/2012  . NSTEMI, initial episode of care [facility]) 01/02/2012    Past Surgical History:  Procedure Laterality Date  . CARDIAC CATHETERIZATION  01/01/2012    WITH CORONARY ANGIOGRAM, left heart   . CARDIAC CATHETERIZATION N/A 10/11/2015   Procedure: Left Heart Cath and Coronary Angiography;  Surgeon: Tonny Bollman, MD;  Location: [facility] INVASIVE CV LAB;  Service: Cardiovascular;  Laterality: N/A;  . CORONARY STENT PLACEMENT    . LEFT HEART CATHETERIZATION WITH CORONARY ANGIOGRAM N/A 01/01/2012   Procedure: LEFT HEART CATHETERIZATION WITH CORONARY ANGIOGRAM;  Surgeon: Iran Ouch, MD;  Location: [facility] CATH LAB;  Service: Cardiovascular;  Laterality: N/A;     OB History   No obstetric history on file.     Family History  Problem Relation Age of Onset  . Heart attack Mother   . Heart attack Father     Social History  Tobacco Use  . Smoking status: Current Every Day Smoker    Packs/day: 1.00    Years: 25.00    Pack years: 25.00    Types: Cigarettes  . Smokeless tobacco: Never Used  . Tobacco comment: stop june 2013  Substance Use Topics  . Alcohol use: No  . Drug use: No    Home Medications Prior to Admission medications   Medication Sig Start Date End Date Taking? Authorizing Provider  ALPRAZolam Prudy Feeler) 1 MG tablet Take 1 mg by mouth 3 (three) times daily as needed for anxiety.  09/28/15  Yes [provider]  atorvastatin (LIPITOR) 80 MG tablet Take  80 mg by mouth daily. 08/01/15  Yes [provider]  clopidogrel (PLAVIX) 75 MG tablet TAKE 1 TABLET BY MOUTH EVERY DAY Patient taking differently: Take 75 mg by mouth daily.  05/04/13  Yes Laurey Morale, MD  desvenlafaxine (PRISTIQ) 50 MG 24 hr tablet Take 50 mg by mouth daily. 11/03/19  Yes [provider]  DULoxetine (CYMBALTA) 60 MG capsule Take 60 mg by mouth daily. 07/20/15  Yes [provider]  furosemide (LASIX) 40 MG tablet Take 40 mg by mouth daily. 02/23/20  Yes [provider]  gabapentin (NEURONTIN) 600 MG tablet Take 600 mg by mouth at bedtime.    Yes [provider]  LATUDA 40 MG TABS tablet Take 40 mg by mouth daily.  09/30/15  Yes [provider]  levothyroxine (SYNTHROID, LEVOTHROID) 125 MCG tablet Take 125 mcg by mouth daily. 10/09/15  Yes [provider]  lisinopril (PRINIVIL,ZESTRIL) 5 MG tablet Take 5 mg by mouth daily. 07/20/15  Yes [provider]  metoprolol succinate (TOPROL-XL) 25 MG 24 hr tablet Take 25 mg by mouth daily. 07/20/15  Yes [provider]  nitroGLYCERIN (NITROSTAT) 0.4 MG SL tablet Place 1 tablet (0.4 mg total) under the tongue every 5 (five) minutes as needed for chest pain. 01/02/12 02/25/20 Yes Barrett, Joline Salt, PA-C  omeprazole (PRILOSEC) 40 MG capsule Take 40 mg by mouth daily. 08/30/15  Yes [provider]  TRELEGY ELLIPTA 100-62.5-25 MCG/INH AEPB Inhale 1 puff into the lungs daily. 01/18/19  Yes [provider]  VENTOLIN HFA 108 (90 Base) MCG/ACT inhaler Inhale 2 puffs into the lungs every 6 (six) hours as needed for shortness of breath. 12/30/18  Yes [provider]  zolpidem (AMBIEN) 10 MG tablet Take 10 mg by mouth at bedtime as needed for sleep.  10/01/15  Yes [provider]  aspirin EC 81 MG EC tablet Take 1 tablet (81 mg total) by mouth daily. Patient not taking: Reported on 01/31/2019 10/11/15   Ellsworth Lennox, PA-C  isosorbide mononitrate  (IMDUR) 30 MG 24 hr tablet TAKE 0.5 TABLETS (15 MG TOTAL) BY MOUTH DAILY. Patient not taking: Reported on 02/25/2020 03/03/17   Ellsworth Lennox, PA-C  nicotine (NICODERM CQ - DOSED IN MG/24 HOURS) 21 mg/24hr patch Place 1 patch (21 mg total) onto the skin daily. Patient not taking: Reported on 02/25/2020 02/03/19   Joycelyn Das, MD  rosuvastatin (CRESTOR) 10 MG tablet Take 10 mg by mouth daily. 02/05/20   [provider]    Allergies    Patient has no known allergies.  Review of Systems   Review of Systems  Constitutional: Negative for chills and fever.  HENT: Negative for ear pain and sore throat.   Eyes: Negative for pain and visual disturbance.  Respiratory: Positive for cough and shortness of breath.   Cardiovascular:  Negative for chest pain and palpitations.  Gastrointestinal: Positive for nausea. Negative for abdominal pain and vomiting.  Genitourinary: Negative for dysuria and hematuria.  Musculoskeletal: Negative for arthralgias and back pain.  Skin: Negative for color change and rash.  Neurological: Positive for light-headedness. Negative for syncope.  All other systems reviewed and are negative.   Physical Exam Updated Vital Signs BP (!) 86/52   Pulse 73   Temp (!) 97.5 F (36.4 C) (Oral)   Resp (!) 22   SpO2 100%   Physical Exam Vitals and nursing note reviewed.  Constitutional:      General: She is not in acute distress.    Appearance: She is well-developed. She is obese.  HENT:     Head: Normocephalic.      Comments: Small hematoma in occiput Eyes:     Conjunctiva/sclera: Conjunctivae normal.  Cardiovascular:     Rate and Rhythm: Normal rate and regular rhythm.     Pulses: Normal pulses.  Pulmonary:     Effort: Pulmonary effort is normal.     Comments: 95% on 6L [place] Coarse, wet cough Crackles bilaterally in lungs Abdominal:     Palpations: Abdomen is soft.     Tenderness: There is no abdominal tenderness.  Musculoskeletal:     Cervical  back: Neck supple.     Right lower leg: Edema present.     Left lower leg: Edema present.  Skin:    General: Skin is warm and dry.  Neurological:     General: No focal deficit present.     Mental Status: She is alert and oriented to person, place, and time.     ED Results / Procedures / Treatments   Labs (all labs ordered are listed, but only abnormal results are displayed) Labs Reviewed  LACTIC ACID, PLASMA - Abnormal; Notable for the following components:      Result Value   Lactic Acid, Venous 2.7 (*)    All other components within normal limits  COMPREHENSIVE METABOLIC PANEL - Abnormal; Notable for the following components:   Sodium 133 (*)    Potassium 2.7 (*)    CO2 21 (*)    Glucose, Bld 111 (*)    Creatinine, Ser 2.39 (*)    Calcium 8.1 (*)    Total Protein 5.1 (*)    Albumin 2.5 (*)    GFR calc non Af Amer 21 (*)    GFR calc Af Amer 25 (*)    All other components within normal limits  CBC WITH DIFFERENTIAL/PLATELET - Abnormal; Notable for the following components:   WBC 38.7 (*)    RBC 3.34 (*)    Hemoglobin 9.3 (*)    HCT 30.3 (*)    Neutro Abs 33.2 (*)    Monocytes Absolute 1.8 (*)    Abs Immature Granulocytes 0.56 (*)    All other components within normal limits  BRAIN NATRIURETIC PEPTIDE - Abnormal; Notable for the following components:   B Natriuretic Peptide 201.2 (*)    All other components within normal limits  I-STAT VENOUS BLOOD GAS, ED - Abnormal; Notable for the following components:   pO2, Ven 96.0 (*)    Acid-base deficit 5.0 (*)    Sodium 134 (*)    Potassium 2.7 (*)    Calcium, Ion 1.13 (*)    HCT 34.0 (*)    Hemoglobin 11.6 (*)    All other components within normal limits  SARS CORONAVIRUS 2 BY RT PCR (HOSPITAL ORDER, PERFORMED IN [facility])  CULTURE, BLOOD (ROUTINE X 2)  CULTURE, BLOOD (ROUTINE X 2)  URINE CULTURE  RESPIRATORY PANEL BY PCR  EXPECTORATED SPUTUM ASSESSMENT W REFEX TO RESP CULTURE  PROTIME-INR  APTT   CBC WITH DIFFERENTIAL/PLATELET  LACTIC ACID, PLASMA  CBC WITH DIFFERENTIAL/PLATELET  URINALYSIS, ROUTINE W REFLEX MICROSCOPIC  STREP PNEUMONIAE URINARY ANTIGEN  HIV ANTIBODY (ROUTINE TESTING W REFLEX)  CBC  CREATININE, SERUM  BASIC METABOLIC PANEL  LACTIC ACID, PLASMA  LACTIC ACID, PLASMA  PROCALCITONIN  CBC  LEGIONELLA PNEUMOPHILA SEROGP 1 UR AG  TROPONIN I (HIGH SENSITIVITY)    EKG None  Radiology CT Head Wo Contrast  Result Date: 02/24/2020 CLINICAL DATA:  Fall, hit back of head.  On blood thinners. EXAM: CT HEAD WITHOUT CONTRAST TECHNIQUE: Contiguous axial images were obtained from the base of the skull through the vertex without intravenous contrast. COMPARISON:  01/19/2019 FINDINGS: Brain: No acute intracranial abnormality. Specifically, no hemorrhage, hydrocephalus, mass lesion, acute infarction, or significant intracranial injury. Vascular: No hyperdense vessel or unexpected calcification. Skull: No acute calvarial abnormality. Sinuses/Orbits: Visualized paranasal sinuses and mastoids clear. Orbital soft tissues unremarkable. Other: None IMPRESSION: Normal study. Electronically Signed   By: Charlett NoseKevin  Dover M.D.   On: 02/24/2020 20:46   CT Chest Wo Contrast  Result Date: 02/24/2020 CLINICAL DATA:  Chest pain EXAM: CT CHEST WITHOUT CONTRAST TECHNIQUE: Multidetector CT imaging of the chest was performed following the standard protocol without IV contrast. COMPARISON:  10/02/2018 FINDINGS: Cardiovascular: Left anterior descending coronary artery stenting has been performed. Global cardiac size within normal limits. Central pulmonary arteries are of normal caliber. Thoracic aorta is unremarkable. Mediastinum/Nodes: No pathologic thoracic adenopathy. Small hiatal hernia. Esophagus unremarkable. Thyroid gland absent, possibly postsurgical or post ablated in nature. Lungs/Pleura: Multifocal peribronchial nodular infiltrates and scattered areas of pulmonary consolidation are identified most  in keeping with atypical infection, more severe within the right lung. No pneumothorax or pleural effusion. Central airways are widely patent. Upper Abdomen: Unremarkable Musculoskeletal: Unremarkable IMPRESSION: Multifocal peribronchial nodular infiltrates and scattered areas of pulmonary consolidation are identified most in keeping with atypical infection, more severe within the right lung. The findings are not pathognomonic of COVID-19 pneumonia and additional organism should be considered. Electronically Signed   By: Helyn NumbersAshesh  Parikh MD   On: 02/24/2020 20:50   DG Chest Port 1 View  Result Date: 02/24/2020 CLINICAL DATA:  CVC placement EXAM: PORTABLE CHEST 1 VIEW COMPARISON:  02/24/2020, CT 02/24/2020 FINDINGS: Right-sided central venous catheter with tip projecting over the distal SVC. There is no pneumothorax identified. Vague bilateral foci of airspace disease corresponding to CT areas of pneumonia. Mild cardiomegaly. IMPRESSION: Right-sided central venous catheter tip overlies the distal SVC. No pneumothorax. Mild cardiomegaly. Scattered foci of airspace disease corresponding to CT demonstrated pneumonia. Electronically Signed   By: Jasmine PangKim  Fujinaga M.D.   On: 02/24/2020 22:55   DG Chest Portable 1 View  Result Date: 02/24/2020 CLINICAL DATA:  Dyspnea EXAM: PORTABLE CHEST 1 VIEW COMPARISON:  None. FINDINGS: Triangular opacity within the right upper lung zone is new from prior examination and is indeterminate. This may represent pleural plaque, better visualized due to rotation of the patient on this examination, however, a focal pulmonary infiltrate or pulmonary nodule is difficult to exclude. Lungs are otherwise clear. No pneumothorax or pleural effusion. Cardiac size within normal limits. Remote left humeral neck fracture again noted. No acute bone abnormality. IMPRESSION: Triangular opacity within the right upper lung zone is new from prior examination and is indeterminate. This  may represent pleural  plaque, however, a focal pulmonary infiltrate or pulmonary nodule is difficult to exclude. This could be further assessed with dedicated two view chest radiograph or CT examination once the patient's acute issues have resolved. Electronically Signed   By: Helyn Numbers MD   On: 02/24/2020 21:13    Procedures .Critical Care Performed by: Terald Sleeper, MD Authorized by: Terald Sleeper, MD   Critical care provider statement:    Critical care time (minutes):  45   Critical care was necessary to treat or prevent imminent or life-threatening deterioration of the following conditions:  Sepsis   Critical care was time spent personally by me on the following activities:  Discussions with consultants, evaluation of patient's response to treatment, examination of patient, ordering and performing treatments and interventions, ordering and review of laboratory studies, ordering and review of radiographic studies, pulse oximetry, re-evaluation of patient's condition, obtaining history from patient or surrogate and review of old charts .Central Line  Date/Time: 02/25/2020 12:48 AM Performed by: Terald Sleeper, MD Authorized by: Terald Sleeper, MD   Consent:    Consent obtained:  Emergent situation Universal protocol:    Procedure explained and questions answered to patient or proxy's satisfaction: yes     Relevant documents present and verified: yes     Test results available and properly labeled: yes     Imaging studies available: yes     Required blood products, implants, devices, and special equipment available: yes     Site/side marked: yes     Immediately prior to procedure, a time out was called: yes     Patient identity confirmed:  Arm band and hospital-assigned identification number Pre-procedure details:    Hand hygiene: Hand hygiene performed prior to insertion     Sterile barrier technique: All elements of maximal sterile technique followed     Skin preparation:   ChloraPrep   Skin preparation agent: Skin preparation agent completely dried prior to procedure   Anesthesia (see MAR for exact dosages):    Anesthesia method:  Local infiltration   Local anesthetic:  Lidocaine 1% w/o epi Procedure details:    Location:  R internal jugular   Patient position:  Trendelenburg   Procedural supplies:  Triple lumen   Catheter size:  7.5 Fr   Landmarks identified: yes     Ultrasound guidance: yes     Sterile ultrasound techniques: Sterile gel and sterile probe covers were used     Number of attempts:  1   Successful placement: yes   Post-procedure details:    Post-procedure:  Dressing applied and line sutured   Assessment:  Blood return through all ports, no pneumothorax on x-ray, free fluid flow and placement verified by x-ray   Patient tolerance of procedure:  Tolerated well, no immediate complications   (including critical care time)  Medications Ordered in ED Medications  doxycycline (VIBRAMYCIN) 100 mg in sodium chloride 0.9 % 250 mL IVPB (100 mg Intravenous New Bag/Given 02/24/20 2312)  norepinephrine (LEVOPHED)  in premix infusion (18 mcg/min Intravenous Rate/Dose Change 02/24/20 2303)  lactated ringers infusion (has no administration in time range)  cefTRIAXone (ROCEPHIN) 2 g in sodium chloride 0.9 % 100 mL IVPB (has no administration in time range)  cefTRIAXone (ROCEPHIN) 1 g in sodium chloride 0.9 % 100 mL IVPB (has no administration in time range)  docusate sodium (COLACE) capsule 100 mg (has no administration in time range)  polyethylene glycol (MIRALAX / GLYCOLAX) packet 17 g (  has no administration in time range)  heparin injection 5,000 Units (has no administration in time range)  ondansetron (ZOFRAN) injection 4 mg (has no administration in time range)  ipratropium-albuterol (DUONEB) 0.5-2.5 (3) MG/3ML nebulizer solution 3 mL (has no administration in time range)  doxycycline (VIBRAMYCIN) 10 mg in dextrose 5 % 250 mL IVPB (has no  administration in time range)  aspirin EC tablet 81 mg (has no administration in time range)  atorvastatin (LIPITOR) tablet 80 mg (has no administration in time range)  buPROPion (WELLBUTRIN SR) 12 hr tablet 150 mg (has no administration in time range)  DULoxetine (CYMBALTA) DR capsule 60 mg (has no administration in time range)  lurasidone (LATUDA) tablet 40 mg (has no administration in time range)  levothyroxine (SYNTHROID) tablet 125 mcg (has no administration in time range)  pantoprazole (PROTONIX) EC tablet 40 mg (has no administration in time range)  famotidine (PEPCID) tablet 20 mg (has no administration in time range)  clopidogrel (PLAVIX) tablet 75 mg (has no administration in time range)  guaiFENesin-dextromethorphan (ROBITUSSIN DM) 100-10 MG/5ML syrup 5 mL (has no administration in time range)  lactated ringers bolus 1,000 mL (has no administration in time range)  fluticasone furoate-vilanterol (BREO ELLIPTA) 100-25 MCG/INH 1 puff (has no administration in time range)    And  umeclidinium bromide (INCRUSE ELLIPTA) 62.5 MCG/INH 1 puff (has no administration in time range)  furosemide (LASIX) injection 60 mg (60 mg Intravenous Given 02/24/20 2048)  cefTRIAXone (ROCEPHIN) 1 g in sodium chloride 0.9 % 100 mL IVPB (0 g Intravenous Stopped 02/24/20 2318)  EPINEPHrine (ADRENALIN) 1 MG/10ML injection 0.1 mg (0.1 mg Intravenous Given by Other 02/24/20 2200)  lactated ringers bolus 1,000 mL (0 mLs Intravenous Stopped 02/25/20 0037)    And  lactated ringers bolus 1,000 mL (0 mLs Intravenous Stopped 02/25/20 0036)    ED Course  I have reviewed the triage vital signs and the nursing notes.  Pertinent labs & imaging results that were available during my care of the patient were reviewed by me and considered in my medical decision making (see chart for details).  59 yo female presenting to ED with mechanical fall earlier today, subsequently developing hypoxia and respiratory distress at home.   Patient requiring 6L [place] here which is a new oxygen requirement.  On her arrival her airway was patent, stable on 6L [place], RR wnl.  She had evidence of pulmonary edema on my initial exam, with pitting edema of the extremities.  This raised concern for CHF.  I asked that lasix by given IV, and that the patient be taken for stat [facility] and CT chest, as there was concern for some element of confusion.  Labs were initially drawn but clotted.  CT chest showed evidence of atypical PNA.  Upon her return from CT her blood pressure had dropped, and the patient was symptomatic with this.  We could not obtain peripheral IV access so I emergently placed a central line in her right CVC and advised 1 cc of push-dose epi by given through her peripheral IV while doing so.  We started her on a full sepsis protocol including IVF bolus, levophed, ceftriaxone and doxycycline for likely pulmonary source.   Labs were subsequently redrawn from the central line and sent off.  I placed a state consult with critical care, who admitted the patient to the ICU.  At the time of admission her BP had stabilized and improved with her medications, she continued to have some confusion but could answer  questions appropriately.  Her labs subsequently showed lactate 2.7, trop 9, hypokalemia at 2.7.  I personally reviewed her xray which confirmed placement of CVC in right IJ, no PTX  Her telemetry showed sinus tachycardia  Clinical Course as of Feb 24 57  Thu Feb 24, 2020  2057 IMPRESSION: Multifocal peribronchial nodular infiltrates and scattered areas of pulmonary consolidation are identified most in keeping with atypical infection, more severe within the right lung. The findings are not pathognomonic of COVID-19 pneumonia and additional organism should be considered.   [MT]  2228 Pt became extremely hypotensive on my reassessment, BP 67/14, felt nauseous and lightheaded.  Immediately placed in trendelenburg.  We could not secure a  reliable IV, so I emergently placed a right CVC central line 15 cm line.  I've advised nursing to initiate sepsis protocol with 30 cc/kg bolus, initiate levophed, and give ceftriaxone and doxycycline for suspected PNA source.  We are obtaining labs via the central line as we were not able to draw labs off her peripheral IV   [MT]  2232 Critical care paged.    [MT]  2253 Spoke to ICU E link doc, team to come evaluate patient.   [MT]  2339 Signed out to Dr Preston Fleeting overnight EDP with plan for ICU admission, awaiting bedside evaluation, patient's BP has improved on levophed and with fluids now 80/50's systolic, no hypoxia or indication for intubation at this time.  The nurses report many of the blood samples they've drawn has clotted, even off the patient's central line.  It appears a few tubes are in process at the lab.   [MT]    Clinical Course User Index [MT] Barron Vanloan, Kermit Balo, MD    Final Clinical Impression(s) / ED Diagnoses Final diagnoses:  Sepsis with encephalopathy, due to unspecified organism, unspecified whether septic shock present [facility])  Hypokalemia    Rx / DC Orders ED Discharge Orders    None       Jhonny Calixto, Kermit Balo, MD 02/25/20 [phone]

## 2020-02-24 NOTE — ED Notes (Signed)
1st set of blood cultures drawn

## 2020-02-25 ENCOUNTER — Inpatient Hospital Stay (HOSPITAL_COMMUNITY): Payer: Medicare HMO

## 2020-02-25 DIAGNOSIS — K219 Gastro-esophageal reflux disease without esophagitis: Secondary | ICD-10-CM | POA: Diagnosis present

## 2020-02-25 DIAGNOSIS — J189 Pneumonia, unspecified organism: Secondary | ICD-10-CM

## 2020-02-25 DIAGNOSIS — F419 Anxiety disorder, unspecified: Secondary | ICD-10-CM | POA: Diagnosis present

## 2020-02-25 DIAGNOSIS — I2510 Atherosclerotic heart disease of native coronary artery without angina pectoris: Secondary | ICD-10-CM | POA: Diagnosis present

## 2020-02-25 DIAGNOSIS — B958 Unspecified staphylococcus as the cause of diseases classified elsewhere: Secondary | ICD-10-CM | POA: Diagnosis not present

## 2020-02-25 DIAGNOSIS — R7881 Bacteremia: Secondary | ICD-10-CM | POA: Diagnosis not present

## 2020-02-25 DIAGNOSIS — A419 Sepsis, unspecified organism: Secondary | ICD-10-CM | POA: Diagnosis not present

## 2020-02-25 DIAGNOSIS — I5032 Chronic diastolic (congestive) heart failure: Secondary | ICD-10-CM | POA: Diagnosis not present

## 2020-02-25 DIAGNOSIS — E785 Hyperlipidemia, unspecified: Secondary | ICD-10-CM | POA: Diagnosis not present

## 2020-02-25 DIAGNOSIS — F315 Bipolar disorder, current episode depressed, severe, with psychotic features: Secondary | ICD-10-CM | POA: Diagnosis present

## 2020-02-25 DIAGNOSIS — Z7902 Long term (current) use of antithrombotics/antiplatelets: Secondary | ICD-10-CM | POA: Diagnosis not present

## 2020-02-25 DIAGNOSIS — E876 Hypokalemia: Secondary | ICD-10-CM | POA: Diagnosis not present

## 2020-02-25 DIAGNOSIS — J181 Lobar pneumonia, unspecified organism: Secondary | ICD-10-CM | POA: Diagnosis not present

## 2020-02-25 DIAGNOSIS — G9340 Encephalopathy, unspecified: Secondary | ICD-10-CM | POA: Diagnosis not present

## 2020-02-25 DIAGNOSIS — R21 Rash and other nonspecific skin eruption: Secondary | ICD-10-CM | POA: Diagnosis not present

## 2020-02-25 DIAGNOSIS — E861 Hypovolemia: Secondary | ICD-10-CM | POA: Diagnosis present

## 2020-02-25 DIAGNOSIS — I110 Hypertensive heart disease with heart failure: Secondary | ICD-10-CM | POA: Diagnosis present

## 2020-02-25 DIAGNOSIS — J441 Chronic obstructive pulmonary disease with (acute) exacerbation: Secondary | ICD-10-CM | POA: Diagnosis not present

## 2020-02-25 DIAGNOSIS — R6520 Severe sepsis without septic shock: Secondary | ICD-10-CM | POA: Diagnosis not present

## 2020-02-25 DIAGNOSIS — J9601 Acute respiratory failure with hypoxia: Secondary | ICD-10-CM

## 2020-02-25 DIAGNOSIS — I351 Nonrheumatic aortic (valve) insufficiency: Secondary | ICD-10-CM | POA: Diagnosis not present

## 2020-02-25 DIAGNOSIS — J1520 Pneumonia due to staphylococcus, unspecified: Secondary | ICD-10-CM | POA: Diagnosis not present

## 2020-02-25 DIAGNOSIS — G9341 Metabolic encephalopathy: Secondary | ICD-10-CM | POA: Diagnosis not present

## 2020-02-25 DIAGNOSIS — E7849 Other hyperlipidemia: Secondary | ICD-10-CM | POA: Diagnosis not present

## 2020-02-25 DIAGNOSIS — E872 Acidosis: Secondary | ICD-10-CM | POA: Diagnosis not present

## 2020-02-25 DIAGNOSIS — A4159 Other Gram-negative sepsis: Secondary | ICD-10-CM | POA: Diagnosis not present

## 2020-02-25 DIAGNOSIS — I361 Nonrheumatic tricuspid (valve) insufficiency: Secondary | ICD-10-CM | POA: Diagnosis not present

## 2020-02-25 DIAGNOSIS — R6521 Severe sepsis with septic shock: Secondary | ICD-10-CM | POA: Diagnosis not present

## 2020-02-25 DIAGNOSIS — N170 Acute kidney failure with tubular necrosis: Secondary | ICD-10-CM | POA: Diagnosis not present

## 2020-02-25 DIAGNOSIS — Z452 Encounter for adjustment and management of vascular access device: Secondary | ICD-10-CM | POA: Diagnosis not present

## 2020-02-25 DIAGNOSIS — J440 Chronic obstructive pulmonary disease with acute lower respiratory infection: Secondary | ICD-10-CM | POA: Diagnosis present

## 2020-02-25 DIAGNOSIS — J449 Chronic obstructive pulmonary disease, unspecified: Secondary | ICD-10-CM | POA: Diagnosis not present

## 2020-02-25 DIAGNOSIS — R69 Illness, unspecified: Secondary | ICD-10-CM | POA: Diagnosis not present

## 2020-02-25 DIAGNOSIS — E871 Hypo-osmolality and hyponatremia: Secondary | ICD-10-CM | POA: Diagnosis not present

## 2020-02-25 DIAGNOSIS — M797 Fibromyalgia: Secondary | ICD-10-CM | POA: Diagnosis present

## 2020-02-25 DIAGNOSIS — Z20822 Contact with and (suspected) exposure to covid-19: Secondary | ICD-10-CM | POA: Diagnosis not present

## 2020-02-25 DIAGNOSIS — F17210 Nicotine dependence, cigarettes, uncomplicated: Secondary | ICD-10-CM | POA: Diagnosis present

## 2020-02-25 DIAGNOSIS — E039 Hypothyroidism, unspecified: Secondary | ICD-10-CM | POA: Diagnosis present

## 2020-02-25 DIAGNOSIS — N179 Acute kidney failure, unspecified: Secondary | ICD-10-CM

## 2020-02-25 DIAGNOSIS — R410 Disorientation, unspecified: Secondary | ICD-10-CM | POA: Diagnosis not present

## 2020-02-25 LAB — BLOOD CULTURE ID PANEL (REFLEXED) - BCID2

## 2020-02-25 LAB — BASIC METABOLIC PANEL
Anion gap: 13 (ref 5–15)
BUN: 16 mg/dL (ref 8–23)
CO2: 19 mmol/L — ABNORMAL LOW (ref 22–32)
Calcium: 7.9 mg/dL — ABNORMAL LOW (ref 8.9–10.3)
Chloride: 102 mmol/L (ref 98–111)
Creatinine, Ser: 1.96 mg/dL — ABNORMAL HIGH (ref 0.44–1.00)
GFR calc Af Amer: 31 mL/min — ABNORMAL LOW (ref >60)
GFR calc non Af Amer: 27 mL/min — ABNORMAL LOW (ref >60)
Glucose, Bld: 123 mg/dL — ABNORMAL HIGH (ref 70–99)
Potassium: 3.3 mmol/L — ABNORMAL LOW (ref 3.5–5.1)
Sodium: 134 mmol/L — ABNORMAL LOW (ref 135–145)

## 2020-02-25 LAB — COMPREHENSIVE METABOLIC PANEL
ALT: 13 U/L (ref 0–44)
AST: 18 U/L (ref 15–41)
Albumin: 2.5 g/dL — ABNORMAL LOW (ref 3.5–5.0)
Alkaline Phosphatase: 86 U/L (ref 38–126)
Anion gap: 11 (ref 5–15)
BUN: 15 mg/dL (ref 8–23)
CO2: 21 mmol/L — ABNORMAL LOW (ref 22–32)
Calcium: 8.1 mg/dL — ABNORMAL LOW (ref 8.9–10.3)
Chloride: 101 mmol/L (ref 98–111)
Creatinine, Ser: 2.39 mg/dL — ABNORMAL HIGH (ref 0.44–1.00)
GFR calc Af Amer: 25 mL/min — ABNORMAL LOW (ref >60)
GFR calc non Af Amer: 21 mL/min — ABNORMAL LOW (ref >60)
Glucose, Bld: 111 mg/dL — ABNORMAL HIGH (ref 70–99)
Potassium: 2.7 mmol/L — CL (ref 3.5–5.1)
Sodium: 133 mmol/L — ABNORMAL LOW (ref 135–145)
Total Bilirubin: 0.5 mg/dL (ref 0.3–1.2)
Total Protein: 5.1 g/dL — ABNORMAL LOW (ref 6.5–8.1)

## 2020-02-25 LAB — CBC WITH DIFFERENTIAL/PLATELET
Abs Immature Granulocytes: 0.56 10*3/uL — ABNORMAL HIGH (ref 0.00–0.07)
Basophils Absolute: 0.1 10*3/uL (ref 0.0–0.1)
Basophils Relative: 0 %
Eosinophils Absolute: 0.1 10*3/uL (ref 0.0–0.5)
Eosinophils Relative: 0 %
HCT: 30.3 % — ABNORMAL LOW (ref 36.0–46.0)
Hemoglobin: 9.3 g/dL — ABNORMAL LOW (ref 12.0–15.0)
Immature Granulocytes: 1 %
Lymphocytes Relative: 8 %
Lymphs Abs: 3 10*3/uL (ref 0.7–4.0)
MCH: 27.8 pg (ref 26.0–34.0)
MCHC: 30.7 g/dL (ref 30.0–36.0)
MCV: 90.7 fL (ref 80.0–100.0)
Monocytes Absolute: 1.8 10*3/uL — ABNORMAL HIGH (ref 0.1–1.0)
Monocytes Relative: 5 %
Neutro Abs: 33.2 10*3/uL — ABNORMAL HIGH (ref 1.7–7.7)
Neutrophils Relative %: 86 %
Platelets: 355 10*3/uL (ref 150–400)
RBC: 3.34 MIL/uL — ABNORMAL LOW (ref 3.87–5.11)
RDW: 14.4 % (ref 11.5–15.5)
WBC: 38.7 10*3/uL — ABNORMAL HIGH (ref 4.0–10.5)
nRBC: 0 % (ref 0.0–0.2)

## 2020-02-25 LAB — CBC
HCT: 32.1 % — ABNORMAL LOW (ref 36.0–46.0)
Hemoglobin: 10.1 g/dL — ABNORMAL LOW (ref 12.0–15.0)
MCH: 27.7 pg (ref 26.0–34.0)
MCHC: 31.5 g/dL (ref 30.0–36.0)
MCV: 88.2 fL (ref 80.0–100.0)
Platelets: 363 10*3/uL (ref 150–400)
RBC: 3.64 MIL/uL — ABNORMAL LOW (ref 3.87–5.11)
RDW: 14.5 % (ref 11.5–15.5)
WBC: 40.8 10*3/uL — ABNORMAL HIGH (ref 4.0–10.5)
nRBC: 0 % (ref 0.0–0.2)

## 2020-02-25 LAB — TROPONIN I (HIGH SENSITIVITY): Troponin I (High Sensitivity): 9 ng/L (ref <18)

## 2020-02-25 LAB — PROCALCITONIN: Procalcitonin: 20.98 ng/mL

## 2020-02-25 LAB — HIV ANTIBODY (ROUTINE TESTING W REFLEX): HIV Screen 4th Generation wRfx: NONREACTIVE

## 2020-02-25 LAB — EXPECTORATED SPUTUM ASSESSMENT W GRAM STAIN, RFLX TO RESP C

## 2020-02-25 LAB — LACTIC ACID, PLASMA: Lactic Acid, Venous: 2.5 mmol/L (ref 0.5–1.9)

## 2020-02-25 LAB — PROTIME-INR
INR: 1.2 (ref 0.8–1.2)
Prothrombin Time: 14.4 seconds (ref 11.4–15.2)

## 2020-02-25 LAB — APTT: aPTT: 28 seconds (ref 24–36)

## 2020-02-25 LAB — BRAIN NATRIURETIC PEPTIDE: B Natriuretic Peptide: 201.2 pg/mL — ABNORMAL HIGH (ref 0.0–100.0)

## 2020-02-25 LAB — MRSA PCR SCREENING: MRSA by PCR: POSITIVE — AB

## 2020-02-25 MED ORDER — VANCOMYCIN HCL 1750 MG/350ML IV SOLN
1750.0000 mg | Freq: Once | INTRAVENOUS | Status: AC
  Administered 2020-02-25: 1750 mg via INTRAVENOUS
  Filled 2020-02-25: qty 350

## 2020-02-25 MED ORDER — ACETAMINOPHEN 160 MG/5ML PO SOLN
650.0000 mg | ORAL | Status: AC | PRN
  Administered 2020-02-26 – 2020-02-27 (×6): 650 mg via ORAL
  Filled 2020-02-25 (×6): qty 20.3

## 2020-02-25 MED ORDER — POTASSIUM CHLORIDE CRYS ER 20 MEQ PO TBCR
20.0000 meq | EXTENDED_RELEASE_TABLET | Freq: Once | ORAL | Status: AC
  Administered 2020-02-25: 20 meq via ORAL
  Filled 2020-02-25: qty 1

## 2020-02-25 MED ORDER — UMECLIDINIUM BROMIDE 62.5 MCG/INH IN AEPB
1.0000 | INHALATION_SPRAY | Freq: Every day | RESPIRATORY_TRACT | Status: DC
  Administered 2020-02-25 – 2020-03-01 (×5): 1 via RESPIRATORY_TRACT
  Filled 2020-02-25 (×3): qty 7

## 2020-02-25 MED ORDER — CHLORHEXIDINE GLUCONATE CLOTH 2 % EX PADS
6.0000 | MEDICATED_PAD | Freq: Every day | CUTANEOUS | Status: DC
  Administered 2020-02-25 – 2020-02-29 (×4): 6 via TOPICAL

## 2020-02-25 MED ORDER — LACTATED RINGERS IV BOLUS
1000.0000 mL | Freq: Once | INTRAVENOUS | Status: AC
  Administered 2020-02-25: 1000 mL via INTRAVENOUS

## 2020-02-25 MED ORDER — CLOPIDOGREL BISULFATE 75 MG PO TABS
75.0000 mg | ORAL_TABLET | Freq: Every day | ORAL | Status: DC
  Administered 2020-02-25 – 2020-03-02 (×7): 75 mg via ORAL
  Filled 2020-02-25 (×7): qty 1

## 2020-02-25 MED ORDER — FAMOTIDINE 20 MG PO TABS
20.0000 mg | ORAL_TABLET | Freq: Every day | ORAL | Status: DC
  Administered 2020-02-25 – 2020-02-29 (×5): 20 mg via ORAL
  Filled 2020-02-25 (×5): qty 1

## 2020-02-25 MED ORDER — IPRATROPIUM-ALBUTEROL 0.5-2.5 (3) MG/3ML IN SOLN
3.0000 mL | Freq: Two times a day (BID) | RESPIRATORY_TRACT | Status: DC
  Administered 2020-02-25 – 2020-02-27 (×4): 3 mL via RESPIRATORY_TRACT
  Filled 2020-02-25 (×4): qty 3

## 2020-02-25 MED ORDER — VANCOMYCIN HCL IN DEXTROSE 1-5 GM/200ML-% IV SOLN
1000.0000 mg | INTRAVENOUS | Status: DC
  Administered 2020-02-26: 1000 mg via INTRAVENOUS
  Filled 2020-02-25 (×2): qty 200

## 2020-02-25 MED ORDER — DOCUSATE SODIUM 100 MG PO CAPS: 100.0000 mg | ORAL_CAPSULE | Freq: Two times a day (BID) | ORAL | Status: DC | PRN

## 2020-02-25 MED ORDER — HEPARIN SODIUM (PORCINE) 5000 UNIT/ML IJ SOLN
5000.0000 [IU] | Freq: Three times a day (TID) | INTRAMUSCULAR | Status: DC
  Administered 2020-02-25 – 2020-03-02 (×18): 5000 [IU] via SUBCUTANEOUS
  Filled 2020-02-25 (×17): qty 1

## 2020-02-25 MED ORDER — SODIUM CHLORIDE 0.9 % IV SOLN
2.0000 g | INTRAVENOUS | Status: DC
  Administered 2020-02-25: 2 g via INTRAVENOUS
  Filled 2020-02-25 (×2): qty 20

## 2020-02-25 MED ORDER — SODIUM CHLORIDE 0.9 % IV SOLN
1.0000 g | Freq: Once | INTRAVENOUS | Status: DC
  Filled 2020-02-25: qty 10

## 2020-02-25 MED ORDER — GUAIFENESIN-DM 100-10 MG/5ML PO SYRP
5.0000 mL | ORAL_SOLUTION | ORAL | Status: DC | PRN
  Administered 2020-02-25 – 2020-03-01 (×5): 5 mL via ORAL
  Filled 2020-02-25 (×6): qty 5

## 2020-02-25 MED ORDER — SODIUM CHLORIDE 0.9 % IV SOLN
100.0000 mg | Freq: Two times a day (BID) | INTRAVENOUS | Status: DC
  Administered 2020-02-25 – 2020-02-26 (×3): 100 mg via INTRAVENOUS
  Filled 2020-02-25 (×4): qty 100

## 2020-02-25 MED ORDER — NICOTINE 21 MG/24HR TD PT24
21.0000 mg | MEDICATED_PATCH | Freq: Every day | TRANSDERMAL | Status: DC
  Administered 2020-02-25 – 2020-03-02 (×7): 21 mg via TRANSDERMAL
  Filled 2020-02-25 (×7): qty 1

## 2020-02-25 MED ORDER — MUPIROCIN 2 % EX OINT
1.0000 "application " | TOPICAL_OINTMENT | Freq: Two times a day (BID) | CUTANEOUS | Status: AC
  Administered 2020-02-25 – 2020-02-29 (×9): 1 via NASAL
  Filled 2020-02-25 (×2): qty 22

## 2020-02-25 MED ORDER — DOXYCYCLINE HYCLATE 100 MG IV SOLR
10.0000 mg | Freq: Two times a day (BID) | INTRAVENOUS | Status: DC
  Filled 2020-02-25 (×4): qty 10

## 2020-02-25 MED ORDER — SODIUM CHLORIDE 0.9 % IV SOLN: 2.0000 g | INTRAVENOUS | Status: DC

## 2020-02-25 MED ORDER — LEVOTHYROXINE SODIUM 25 MCG PO TABS
125.0000 ug | ORAL_TABLET | Freq: Every day | ORAL | Status: DC
  Administered 2020-02-25 – 2020-03-02 (×5): 125 ug via ORAL
  Filled 2020-02-25 (×6): qty 1

## 2020-02-25 MED ORDER — POTASSIUM CHLORIDE 10 MEQ/50ML IV SOLN
10.0000 meq | INTRAVENOUS | Status: AC
  Administered 2020-02-25 (×3): 10 meq via INTRAVENOUS
  Filled 2020-02-25 (×3): qty 50

## 2020-02-25 MED ORDER — DULOXETINE HCL 60 MG PO CPEP
60.0000 mg | ORAL_CAPSULE | Freq: Every day | ORAL | Status: DC
  Administered 2020-02-25 – 2020-03-02 (×7): 60 mg via ORAL
  Filled 2020-02-25 (×7): qty 1

## 2020-02-25 MED ORDER — PANTOPRAZOLE SODIUM 40 MG PO TBEC
40.0000 mg | DELAYED_RELEASE_TABLET | Freq: Every day | ORAL | Status: DC
  Administered 2020-02-25 – 2020-03-02 (×7): 40 mg via ORAL
  Filled 2020-02-25 (×7): qty 1

## 2020-02-25 MED ORDER — ALPRAZOLAM 0.5 MG PO TABS
1.0000 mg | ORAL_TABLET | Freq: Three times a day (TID) | ORAL | Status: DC | PRN
  Administered 2020-02-25 – 2020-03-02 (×18): 1 mg via ORAL
  Filled 2020-02-25 (×17): qty 2

## 2020-02-25 MED ORDER — NOREPINEPHRINE 16 MG/250ML-% IV SOLN
0.0000 ug/min | INTRAVENOUS | Status: DC
  Administered 2020-02-25: 2 ug/min via INTRAVENOUS
  Filled 2020-02-25: qty 250

## 2020-02-25 MED ORDER — FLUTICASONE FUROATE-VILANTEROL 100-25 MCG/INH IN AEPB
1.0000 | INHALATION_SPRAY | Freq: Every day | RESPIRATORY_TRACT | Status: DC
  Administered 2020-02-25 – 2020-03-01 (×5): 1 via RESPIRATORY_TRACT
  Filled 2020-02-25 (×3): qty 28

## 2020-02-25 MED ORDER — IPRATROPIUM-ALBUTEROL 0.5-2.5 (3) MG/3ML IN SOLN
3.0000 mL | RESPIRATORY_TRACT | Status: DC
  Administered 2020-02-25 (×2): 3 mL via RESPIRATORY_TRACT
  Filled 2020-02-25 (×2): qty 3

## 2020-02-25 MED ORDER — ALPRAZOLAM 0.5 MG PO TABS
1.0000 mg | ORAL_TABLET | Freq: Three times a day (TID) | ORAL | Status: DC
  Filled 2020-02-25: qty 2

## 2020-02-25 MED ORDER — FLUTICASONE-UMECLIDIN-VILANT 100-62.5-25 MCG/INH IN AEPB: 1.0000 | INHALATION_SPRAY | Freq: Every day | RESPIRATORY_TRACT | Status: DC

## 2020-02-25 MED ORDER — ATORVASTATIN CALCIUM 80 MG PO TABS
80.0000 mg | ORAL_TABLET | Freq: Every day | ORAL | Status: DC
  Administered 2020-02-25 – 2020-03-02 (×7): 80 mg via ORAL
  Filled 2020-02-25 (×7): qty 1

## 2020-02-25 MED ORDER — HYDROCORTISONE NA SUCCINATE PF 100 MG IJ SOLR
100.0000 mg | Freq: Three times a day (TID) | INTRAMUSCULAR | Status: DC
  Administered 2020-02-25 – 2020-02-28 (×11): 100 mg via INTRAVENOUS
  Filled 2020-02-25 (×10): qty 2

## 2020-02-25 MED ORDER — ONDANSETRON HCL 4 MG/2ML IJ SOLN
4.0000 mg | Freq: Four times a day (QID) | INTRAMUSCULAR | Status: DC | PRN
  Administered 2020-02-25: 4 mg via INTRAVENOUS
  Filled 2020-02-25: qty 2

## 2020-02-25 MED ORDER — POLYETHYLENE GLYCOL 3350 17 G PO PACK: 17.0000 g | PACK | Freq: Every day | ORAL | Status: DC | PRN

## 2020-02-25 MED ORDER — BUPROPION HCL ER (SR) 150 MG PO TB12
150.0000 mg | ORAL_TABLET | Freq: Every day | ORAL | Status: DC
  Filled 2020-02-25: qty 1

## 2020-02-25 MED ORDER — LURASIDONE HCL 40 MG PO TABS
40.0000 mg | ORAL_TABLET | Freq: Every day | ORAL | Status: DC
  Administered 2020-02-25 – 2020-03-02 (×7): 40 mg via ORAL
  Filled 2020-02-25 (×8): qty 1

## 2020-02-25 MED ORDER — ASPIRIN EC 81 MG PO TBEC
81.0000 mg | DELAYED_RELEASE_TABLET | Freq: Every day | ORAL | Status: DC
  Administered 2020-02-25 – 2020-03-02 (×7): 81 mg via ORAL
  Filled 2020-02-25 (×7): qty 1

## 2020-02-25 NOTE — Progress Notes (Signed)
PHARMACY - PHYSICIAN COMMUNICATION CRITICAL VALUE ALERT - BLOOD CULTURE IDENTIFICATION (BCID)  Staph species in 1/4 bottles of blood cultures.   Name of physician (or Provider) Contacted: Dr.Ogan with CCM Changes to prescribed antibiotics required: will add vancomycin until more information available.   Sheppard Coil PharmD., BCPS Clinical Pharmacist 02/25/2020 9:03 PM

## 2020-02-25 NOTE — ED Notes (Signed)
Attempted to call husband to give update, no answer

## 2020-02-25 NOTE — Progress Notes (Signed)
Pharmacy Antibiotic Note  Erin Lynch is a 59 y.o. female admitted on 02/24/2020 with fall, respiratory distress, and confusion. Patient now with GPC/staph species growing in one of the blood cultures collected. Patient was placed on ceftriaxone and doxycycline this morning, Pharmacy has now been consulted to add vancomycin until other information is available.  Patient currently afebrile, wbc is greatly elevated at 41, renal function impaired.   Plan: Vancomycin 1750mg  once then 1g  IV every 24 hours.  Goal trough 15-20 mcg/mL.  Weight: 81.3 kg (179 lb 3.7 oz)  Temp (24hrs), Avg:98.6 F (37 C), Min:97.7 F (36.5 C), Max:99.3 F (37.4 C)  Recent Labs  Lab 02/24/20 2219 02/24/20 2222 02/24/20 2330 02/25/20 0240  WBC  --   --  38.7* 40.8*  CREATININE 2.39*  --   --  1.96*  LATICACIDVEN  --  2.7*  --  2.5*    CrCl cannot be calculated (Unknown ideal weight.).    No Known Allergies   Thank you for allowing pharmacy to be a part of this patient's care.  02/27/20 PharmD., BCPS Clinical Pharmacist 02/25/2020 9:02 PM

## 2020-02-25 NOTE — H&P (Signed)
NAME:  Erin Lynch, MRN:  [id number], DOB:  Jan 22, 1961, LOS: 0 ADMISSION DATE:  02/24/2020, CONSULTATION DATE:  02/25/20 REFERRING MD:  Renaye Rakers, MD, CHIEF COMPLAINT:  shock   Brief History   Multilobar CAP with septic shock, encephalopathy  History of present illness   Erin Lynch is a 59 y/o woman with a history of COPD and tobacco abuse who has been feeling ill for about a week. She had been feeling better but started feeling worse again. She fell out of a chair this evening and hit her head. EMS was called and she was brought to the ED. She has been coughing, feeling SOB and feverish. She continues to smoke 1ppd and takes Trelegy for COPD. She is not on home oxygen. She takes plavix, but no other AC. Patient lives with husband.  Upon arrival to the ED she was hypoxic and hypotensive. CVC placed, 2L IVF, norepi, antibiotics given.  Past Medical History  COPD Tobacco abuse Fibromyalgia HFpEF CAD HLD HTN hypothyroidism  Significant Hospital Events     Consults:    Procedures:    Significant Diagnostic Tests:  CT chest: RUL, RLL, LLL patchy pneumonia, no significant effusions  Micro Data:  covid negative Blood cx 8/12> Trach aspirate 8/12>  Antimicrobials:  Ceftriaxone 8/12> Doxycycline 8/12>  Interim history/subjective:    Objective   Blood pressure (!) 86/52, pulse 73, temperature (!) 97.5 F (36.4 C), temperature source Oral, resp. rate (!) 22, SpO2 100 %.        Intake/Output Summary (Last 24 hours) at 02/25/2020 0111 Last data filed at 02/24/2020 2008 Gross per 24 hour  Intake 350 ml  Output --  Net 350 ml   There were no vitals filed for this visit.  Examination: General: chronically ill appearing, fatgiued elderly woman laying in bed sleeping, easily arousable. HENT: [place]/AT, eyes anicteric, oral mucosa dry. Mild epistaxis after NT suctioning; resolved with holding pressure. Lungs: Rhonchi bilaterally, wet sounding weak cough, on 4L [place]. Cardiovascular:  RRR, no murmurs Abdomen: soft, NT Extremities: no clubbing, cyanosis, or edema Neuro: Moving all extremities, globally weak. Answering questions appropriately but slightly slurred speech. Oriented to person and place. Derm: no ecchymoses  Resolved Hospital Problem list     Assessment & Plan:  Septic shock due to multilobar pneumonia -doxycycline and ceftriaxone -additional 1L LR -NE to maintain MAP >65 -serial lactic acid levels -procalcitonin level for monitoring -Legionella and pneumococcus serologies -RVP -NT suction for trach aspirate performed at bedside- sample left with RN -stress dose steroids given recent OP steroid taper -holding PTA antihypertensives  COPD, ongoing tobacco abuse -duonebs Q4h -Con't Trelegy daily -steroids  Acute hypoxic respiratory failure due to CAP -supplemental O2 to maintain SpO2 >88%; titrate down as able -flutter & IS to promote good pulmonary hygiene  AKI 2/2 sepsis Lactic acidosis Hypovolemic hyponatremia Hypokalemia -serial lactates until cleared -MAP >65 with vasopressors -volume resuscitation -K+ repletion  Acute metabolic encephalopathy due to sepsis -con't supportive care -holding PTA benzos and opiates for now; need to verify that these are active prescriptions  Chronic normocytic anemia -OP workup recommended  H/o HTN, HLD, CAD. Chronic HFpEF -con't statin -PTA antihypertensives on hold while in shock  Hypothyroidism -con't PTA synthroid  Best practice:  Diet: regular Pain/Anxiety/Delirium protocol (if indicated): n/a VAP protocol (if indicated): n/a DVT prophylaxis: heparic  GI prophylaxis: PTA PPI & H2 Glucose control: SSI  Mobility: OOB w/ assist Code Status: full Family Communication: attempted to call husband- no answer or VM Disposition: ICU  Labs   CBC: Recent Labs  Lab 02/24/20 2206 02/24/20 2330  WBC  --  38.7*  NEUTROABS  --  33.2*  HGB 11.6* 9.3*  HCT 34.0* 30.3*  MCV  --  90.7  PLT   --  355    Basic Metabolic Panel: Recent Labs  Lab 02/24/20 2206 02/24/20 2219  NA 134* 133*  K 2.7* 2.7*  CL  --  101  CO2  --  21*  GLUCOSE  --  111*  BUN  --  15  CREATININE  --  2.39*  CALCIUM  --  8.1*   GFR: CrCl cannot be calculated (Unknown ideal weight.). Recent Labs  Lab 02/24/20 2222 02/24/20 2330  WBC  --  38.7*  LATICACIDVEN 2.7*  --     Liver Function Tests: Recent Labs  Lab 02/24/20 2219  AST 18  ALT 13  ALKPHOS 86  BILITOT 0.5  PROT 5.1*  ALBUMIN 2.5*   No results for input(s): LIPASE, AMYLASE in the last 168 hours. No results for input(s): AMMONIA in the last 168 hours.  ABG    Component Value Date/Time   HCO3 23.0 02/24/2020 2206   TCO2 25 02/24/2020 2206   ACIDBASEDEF 5.0 (H) 02/24/2020 2206   O2SAT 96.0 02/24/2020 2206     Coagulation Profile: Recent Labs  Lab 02/24/20 2330  INR 1.2    Cardiac Enzymes: No results for input(s): CKTOTAL, CKMB, CKMBINDEX, TROPONINI in the last 168 hours.  HbA1C: No results found for: HGBA1C  CBG: No results for input(s): GLUCAP in the last 168 hours.  Review of Systems:    Review of Systems: (bold if positive, otherwise negative)  General: fevers, chills, sweats HENT: rhinorrhea, congestion, sore throat Eyes: blurry vision, double vision Cardio: chest pain, palpitations, edema Pulm: wheezing, cough, sputum production, hemoptysis, SOB Abd: heartburn, nausea, vomiting, diarrhea, bloody stools, abdominal pain GU: hematuria, dysuria Derm: rashes, wounds Heme/Lymph: adenopathy, bruising Neuro: syncope, headache, vertigo, numbness, weakness   Past Medical History  She,  has a past medical history of Anxiety, Arthritis, Bulging disc, CAD (coronary artery disease), Chronic diastolic CHF (congestive heart failure) (HCC), Depression, Fibromyalgia, GERD (gastroesophageal reflux disease), Hyperlipidemia LDL goal < 70, Hypertension, Hypothyroid, and Obesity (BMI 30.0-34.9).   Surgical History     Past Surgical History:  Procedure Laterality Date  . CARDIAC CATHETERIZATION  01/01/2012    WITH CORONARY ANGIOGRAM, left heart   . CARDIAC CATHETERIZATION N/A 10/11/2015   Procedure: Left Heart Cath and Coronary Angiography;  Surgeon: Tonny Bollman, MD;  Location: [facility] INVASIVE CV LAB;  Service: Cardiovascular;  Laterality: N/A;  . CORONARY STENT PLACEMENT    . LEFT HEART CATHETERIZATION WITH CORONARY ANGIOGRAM N/A 01/01/2012   Procedure: LEFT HEART CATHETERIZATION WITH CORONARY ANGIOGRAM;  Surgeon: Iran Ouch, MD;  Location: [facility] CATH LAB;  Service: Cardiovascular;  Laterality: N/A;     Social History   reports that she has been smoking cigarettes. She has a 25.00 pack-year smoking history. She has never used smokeless tobacco. She reports that she does not drink alcohol and does not use drugs.   Family History   Her family history includes Heart attack in her father and mother.   Allergies No Known Allergies   Home Medications  Prior to Admission medications   Medication Sig Start Date End Date Taking? Authorizing Provider  ALPRAZolam Prudy Feeler) 1 MG tablet Take 1 mg by mouth 3 (three) times daily as needed for anxiety.  09/28/15   [provider]  aspirin  EC 81 MG EC tablet Take 1 tablet (81 mg total) by mouth daily. Patient not taking: Reported on 01/31/2019 10/11/15   Ellsworth Lennox, PA-C  atorvastatin (LIPITOR) 80 MG tablet Take 80 mg by mouth daily. 08/01/15   [provider]  buPROPion (WELLBUTRIN SR) 150 MG 12 hr tablet Take 150 mg by mouth daily.    [provider]  clopidogrel (PLAVIX) 75 MG tablet TAKE 1 TABLET BY MOUTH EVERY DAY Patient taking differently: Take 75 mg by mouth daily.  05/04/13   Laurey Morale, MD  DULoxetine (CYMBALTA) 60 MG capsule Take 60 mg by mouth daily. 07/20/15   [provider]  gabapentin (NEURONTIN) 300 MG capsule Take 300-900 mg by mouth at bedtime.     [provider]   guaiFENesin-dextromethorphan (ROBITUSSIN DM) 100-10 MG/5ML syrup Take 5 mLs by mouth every 4 (four) hours as needed for cough. 02/02/19   Pokhrel, Rebekah Chesterfield, MD  isosorbide mononitrate (IMDUR) 30 MG 24 hr tablet TAKE 0.5 TABLETS (15 MG TOTAL) BY MOUTH DAILY. 03/03/17   Strader, Lennart Pall, PA-C  LATUDA 60 MG TABS Take 40 mg by mouth daily.  09/30/15   [provider]  levothyroxine (SYNTHROID, LEVOTHROID) 125 MCG tablet Take 125 mcg by mouth daily. 10/09/15   [provider]  lisinopril (PRINIVIL,ZESTRIL) 5 MG tablet Take 5 mg by mouth daily. 07/20/15   [provider]  metoprolol succinate (TOPROL-XL) 25 MG 24 hr tablet Take 25 mg by mouth daily. 07/20/15   [provider]  nicotine (NICODERM CQ - DOSED IN MG/24 HOURS) 21 mg/24hr patch Place 1 patch (21 mg total) onto the skin daily. 02/03/19   Pokhrel, Rebekah Chesterfield, MD  nitroGLYCERIN (NITROSTAT) 0.4 MG SL tablet Place 1 tablet (0.4 mg total) under the tongue every 5 (five) minutes as needed for chest pain. 01/02/12 01/31/19  Barrett, Joline Salt, PA-C  omeprazole (PRILOSEC) 40 MG capsule Take 40 mg by mouth daily. 08/30/15   [provider]  oxyCODONE (OXY IR/ROXICODONE) 5 MG immediate release tablet Take 15 mg by mouth 3 (three) times daily.    [provider]  predniSONE (STERAPRED UNI-PAK 21 TAB) 10 MG (21) TBPK tablet 3 tab orally daily x 2 days then 2 tab po daily x 2 days then 1 tab po daily x 2 days 02/02/19   Pokhrel, Rebekah Chesterfield, MD  ranitidine (ZANTAC) 300 MG tablet Take 1 tablet (300 mg total) by mouth at bedtime. 02/05/12   Laurey Morale, MD  TRELEGY ELLIPTA 100-62.5-25 MCG/INH AEPB Inhale 1 puff into the lungs daily. 01/18/19   [provider]  VENTOLIN HFA 108 (90 Base) MCG/ACT inhaler Inhale 2 puffs into the lungs every 6 (six) hours as needed for shortness of breath. 12/30/18   [provider]  zolpidem (AMBIEN) 10 MG tablet Take 10 mg by mouth at bedtime as needed for sleep.  10/01/15    [provider]     Critical care time: 45 min.     Steffanie Dunn, DO 02/25/20 1:43 AM [facility]

## 2020-02-25 NOTE — Plan of Care (Signed)
NT suction sample left at bedside; d/w RN to send to lab with requisition.  Steffanie Dunn, DO 02/25/20 12:28 AM [facility]

## 2020-02-25 NOTE — Progress Notes (Signed)
eLink Physician-Brief Progress Note Patient Name: Erin Lynch DOB: 1961-06-24 MRN: [id number]   Date of Service  02/25/2020  HPI/Events of Note  Patient complaining of back pain.  eICU Interventions  PRN tylenol ordered.        Thomasene Lot Christine Morton 02/25/2020, 10:55 PM

## 2020-02-25 NOTE — ED Notes (Signed)
Pt given 40 mg lasix. Before giving the next 20mg  to total 60, Dr. notified that pt remains hypotensive. Dr. Renaye Rakers given the ok to give the rest of lasix

## 2020-02-26 DIAGNOSIS — B958 Unspecified staphylococcus as the cause of diseases classified elsewhere: Secondary | ICD-10-CM | POA: Diagnosis not present

## 2020-02-26 DIAGNOSIS — A419 Sepsis, unspecified organism: Secondary | ICD-10-CM | POA: Diagnosis not present

## 2020-02-26 DIAGNOSIS — R6521 Severe sepsis with septic shock: Secondary | ICD-10-CM | POA: Diagnosis not present

## 2020-02-26 DIAGNOSIS — R7881 Bacteremia: Secondary | ICD-10-CM

## 2020-02-26 LAB — RESPIRATORY PANEL BY PCR

## 2020-02-26 LAB — BASIC METABOLIC PANEL
Anion gap: 10 (ref 5–15)
BUN: 17 mg/dL (ref 8–23)
CO2: 23 mmol/L (ref 22–32)
Calcium: 8.3 mg/dL — ABNORMAL LOW (ref 8.9–10.3)
Chloride: 106 mmol/L (ref 98–111)
Creatinine, Ser: 1.52 mg/dL — ABNORMAL HIGH (ref 0.44–1.00)
GFR calc Af Amer: 42 mL/min — ABNORMAL LOW (ref >60)
GFR calc non Af Amer: 37 mL/min — ABNORMAL LOW (ref >60)
Glucose, Bld: 163 mg/dL — ABNORMAL HIGH (ref 70–99)
Potassium: 3.3 mmol/L — ABNORMAL LOW (ref 3.5–5.1)
Sodium: 139 mmol/L (ref 135–145)

## 2020-02-26 LAB — LACTIC ACID, PLASMA
Lactic Acid, Venous: 1.4 mmol/L (ref 0.5–1.9)
Lactic Acid, Venous: 1.1 mmol/L (ref 0.5–1.9)

## 2020-02-26 LAB — GLUCOSE, CAPILLARY: Glucose-Capillary: 130 mg/dL — ABNORMAL HIGH (ref 70–99)

## 2020-02-26 LAB — CULTURE, BLOOD (ROUTINE X 2): Special Requests: ADEQUATE

## 2020-02-26 MED ORDER — LACTATED RINGERS IV BOLUS
1000.0000 mL | Freq: Once | INTRAVENOUS | Status: AC
  Administered 2020-02-26: 1000 mL via INTRAVENOUS

## 2020-02-26 MED ORDER — POTASSIUM CHLORIDE 10 MEQ/50ML IV SOLN
10.0000 meq | INTRAVENOUS | Status: AC
  Administered 2020-02-26 (×5): 10 meq via INTRAVENOUS
  Filled 2020-02-26 (×6): qty 50

## 2020-02-26 MED ORDER — POTASSIUM CHLORIDE 10 MEQ/50ML IV SOLN
10.0000 meq | Freq: Once | INTRAVENOUS | Status: AC
  Administered 2020-02-26: 10 meq via INTRAVENOUS

## 2020-02-26 NOTE — Progress Notes (Signed)
NAME:  Erin Lynch, MRN:  [id number], DOB:  [birth year], LOS: 1 ADMISSION DATE:  02/24/2020, CONSULTATION DATE:  02/25/20 REFERRING MD:  Renaye Rakers, MD, CHIEF COMPLAINT:  shock   Brief History   Multilobar CAP with septic shock, encephalopathy  History of present illness   Erin Lynch is a 59 y/o woman with a history of COPD and tobacco abuse who has been feeling ill for about a week. She had been feeling better but started feeling worse again. She fell out of a chair this evening and hit her head. EMS was called and she was brought to the ED. She has been coughing, feeling SOB and feverish. She continues to smoke 1ppd and takes Trelegy for COPD. She is not on home oxygen. She takes plavix, but no other AC. Patient lives with husband.  Upon arrival to the ED she was hypoxic and hypotensive. CVC placed, 2L IVF, norepi, antibiotics given.  Past Medical History  COPD Tobacco abuse Fibromyalgia HFpEF CAD HLD HTN hypothyroidism  Significant Hospital Events     Consults:    Procedures:    Significant Diagnostic Tests:  CT chest: RUL, RLL, LLL patchy pneumonia, no significant effusions  Micro Data:  covid negative Blood cx 8/12> Trach aspirate 8/12>  Antimicrobials:  Ceftriaxone 8/12> Doxycycline 8/12>  Interim history/subjective:  Awake and alert this morning. Remains on low dose pressors  Objective   Blood pressure (!) 142/64, pulse 78, temperature 97.9 F (36.6 C), temperature source Oral, resp. rate (!) 26, height 5\' 3"  (1.6 m), weight 80.6 kg, SpO2 98 %.        Intake/Output Summary (Last 24 hours) at 02/26/2020 1136 Last data filed at 02/26/2020 0700 Gross per 24 hour  Intake 3816.64 ml  Output 3025 ml  Net 791.64 ml   Filed Weights   02/25/20 0300 02/26/20 0600  Weight: 81.3 kg 80.6 kg   Physical Exam: General: Chronically ill-appearing, no acute distress HENT: [place], AT, OP clear, MMM Eyes: EOMI, no scleral icterus Respiratory: Diminished breath sounds  bilaterally.  No crackles, wheezing or rales Cardiovascular: RRR, -M/R/G, no JVD GI: BS+, soft, nontender Extremities:-Edema,-tenderness Neuro: AAO x4, CNII-XII grossly intact, tremor Skin: Intact, no rashes or bruising Psych: Anxious mood, normal affect GU: Foley in place  Resolved Hospital Problem list   Acute encephalopathy secondary to benzos and opiates  Assessment & Plan:  Septic shock due to Staph pneumonia c/b bacteremia Lactic acidosis -Transitioned to Vanc 8/13 -Wean levo to maintain MAP >65 -Repeat blood cultres -Serial lactic acid levels -Echo -RVP pending  COPD, ongoing tobacco abuse -Duonebs BID -Con't LABA/ICS/LAMA inpatient. Resume home Trelegy at discharge -Continue steroids -Nicotine patch  Acute hypoxic respiratory failure due to pna - improving. On 1L -supplemental O2 to maintain SpO2 >88%; titrate down as able -flutter & IS to promote good pulmonary hygiene  AKI 2/2 ATN - improving, good UOP Hypovolemic hyponatremia Hypokalemia -serial lactates until cleared -MAP >65 with vasopressors -volume resuscitation as above -Replete K  Chronic normocytic anemia -OP workup recommended  H/o HTN, HLD, CAD. Chronic HFpEF -con't statin -PTA antihypertensives on hold while in shock  Hypothyroidism -con't PTA synthroid  Anxiety -Resumed home xanax, cymbalta Best practice:  Diet: regular Pain/Anxiety/Delirium protocol (if indicated): n/a VAP protocol (if indicated): n/a DVT prophylaxis: heparic [place] GI prophylaxis: PTA PPI & H2 Glucose control: SSI  Mobility: OOB w/ assist Code Status: full Family Communication: updated patient 8/14 Disposition: ICU  Labs   CBC: Recent Labs  Lab 02/24/20 2206 02/24/20 2330 02/25/20  0240  WBC  --  38.7* 40.8*  NEUTROABS  --  33.2*  --   HGB 11.6* 9.3* 10.1*  HCT 34.0* 30.3* 32.1*  MCV  --  90.7 88.2  PLT  --  355 363    Basic Metabolic Panel: Recent Labs  Lab 02/24/20 2206 02/24/20 2219  02/25/20 0240 02/26/20 0402  NA 134* 133* 134* 139  K 2.7* 2.7* 3.3* 3.3*  CL  --  101 102 106  CO2  --  21* 19* 23  GLUCOSE  --  111* 123* 163*  BUN  --  15 16 17   CREATININE  --  2.39* 1.96* 1.52*  CALCIUM  --  8.1* 7.9* 8.3*   GFR: Estimated Creatinine Clearance: 39.1 mL/min (A) (by C-G formula based on SCr of 1.52 mg/dL (H)). Recent Labs  Lab 02/24/20 2222 02/24/20 2330 02/25/20 0240  PROCALCITON  --   --  20.98  WBC  --  38.7* 40.8*  LATICACIDVEN 2.7*  --  2.5*    Liver Function Tests: Recent Labs  Lab 02/24/20 2219  AST 18  ALT 13  ALKPHOS 86  BILITOT 0.5  PROT 5.1*  ALBUMIN 2.5*   No results for input(s): LIPASE, AMYLASE in the last 168 hours. No results for input(s): AMMONIA in the last 168 hours.  ABG    Component Value Date/Time   HCO3 23.0 02/24/2020 2206   TCO2 25 02/24/2020 2206   ACIDBASEDEF 5.0 (H) 02/24/2020 2206   O2SAT 96.0 02/24/2020 2206     Coagulation Profile: Recent Labs  Lab 02/24/20 2330  INR 1.2    Cardiac Enzymes: No results for input(s): CKTOTAL, CKMB, CKMBINDEX, TROPONINI in the last 168 hours.  HbA1C: No results found for: HGBA1C  CBG: No results for input(s): GLUCAP in the last 168 hours.  The patient is critically ill with multiple organ systems failure and requires high complexity decision making for assessment and support, frequent evaluation and titration of therapies, application of advanced monitoring technologies and extensive interpretation of multiple databases.  Independent Critical Care Time: 35 Minutes.   04/25/20, M.D. [facility] 02/26/2020 11:36 AM   Please see Amion for pager number to reach on-call Pulmonary and Critical Care Team.

## 2020-02-27 ENCOUNTER — Other Ambulatory Visit (HOSPITAL_COMMUNITY): Payer: Medicare HMO

## 2020-02-27 ENCOUNTER — Inpatient Hospital Stay (HOSPITAL_COMMUNITY): Payer: Medicare HMO

## 2020-02-27 DIAGNOSIS — I361 Nonrheumatic tricuspid (valve) insufficiency: Secondary | ICD-10-CM

## 2020-02-27 DIAGNOSIS — R6521 Severe sepsis with septic shock: Secondary | ICD-10-CM | POA: Diagnosis not present

## 2020-02-27 DIAGNOSIS — J449 Chronic obstructive pulmonary disease, unspecified: Secondary | ICD-10-CM | POA: Diagnosis not present

## 2020-02-27 DIAGNOSIS — I351 Nonrheumatic aortic (valve) insufficiency: Secondary | ICD-10-CM

## 2020-02-27 DIAGNOSIS — R7881 Bacteremia: Secondary | ICD-10-CM | POA: Diagnosis not present

## 2020-02-27 DIAGNOSIS — A419 Sepsis, unspecified organism: Secondary | ICD-10-CM | POA: Diagnosis not present

## 2020-02-27 LAB — CBC
HCT: 26.3 % — ABNORMAL LOW (ref 36.0–46.0)
Hemoglobin: 8.1 g/dL — ABNORMAL LOW (ref 12.0–15.0)
MCH: 27.6 pg (ref 26.0–34.0)
MCHC: 30.8 g/dL (ref 30.0–36.0)
MCV: 89.8 fL (ref 80.0–100.0)
Platelets: 217 10*3/uL (ref 150–400)
RBC: 2.93 MIL/uL — ABNORMAL LOW (ref 3.87–5.11)
RDW: 14.7 % (ref 11.5–15.5)
WBC: 13.6 10*3/uL — ABNORMAL HIGH (ref 4.0–10.5)
nRBC: 0 % (ref 0.0–0.2)

## 2020-02-27 LAB — BASIC METABOLIC PANEL
Anion gap: 8 (ref 5–15)
BUN: 14 mg/dL (ref 8–23)
CO2: 21 mmol/L — ABNORMAL LOW (ref 22–32)
Calcium: 8.3 mg/dL — ABNORMAL LOW (ref 8.9–10.3)
Chloride: 109 mmol/L (ref 98–111)
Creatinine, Ser: 0.89 mg/dL (ref 0.44–1.00)
GFR calc Af Amer: >60 mL/min (ref >60)
GFR calc non Af Amer: >60 mL/min (ref >60)
Glucose, Bld: 110 mg/dL — ABNORMAL HIGH (ref 70–99)
Potassium: 3.9 mmol/L (ref 3.5–5.1)
Sodium: 138 mmol/L (ref 135–145)

## 2020-02-27 LAB — CULTURE, RESPIRATORY W GRAM STAIN

## 2020-02-27 LAB — ECHOCARDIOGRAM COMPLETE
Area-P 1/2: 4.8 cm2
Height: 63 in
S' Lateral: 3.87 cm
Weight: 2895.96 oz

## 2020-02-27 LAB — GLUCOSE, CAPILLARY: Glucose-Capillary: 92 mg/dL (ref 70–99)

## 2020-02-27 LAB — MAGNESIUM: Magnesium: 1.7 mg/dL (ref 1.7–2.4)

## 2020-02-27 MED ORDER — ALPRAZOLAM 0.5 MG PO TABS
1.0000 mg | ORAL_TABLET | Freq: Once | ORAL | Status: AC
  Administered 2020-02-27: 1 mg via ORAL
  Filled 2020-02-27: qty 2

## 2020-02-27 MED ORDER — IPRATROPIUM-ALBUTEROL 0.5-2.5 (3) MG/3ML IN SOLN
3.0000 mL | Freq: Four times a day (QID) | RESPIRATORY_TRACT | Status: DC | PRN
  Administered 2020-02-28: 3 mL via RESPIRATORY_TRACT
  Filled 2020-02-27: qty 3

## 2020-02-27 MED ORDER — SODIUM CHLORIDE 0.9 % IV SOLN
2.0000 g | INTRAVENOUS | Status: DC
  Administered 2020-02-27 – 2020-03-02 (×5): 2 g via INTRAVENOUS
  Filled 2020-02-27 (×5): qty 20

## 2020-02-27 NOTE — Progress Notes (Signed)
eLink Physician-Brief Progress Note Patient Name: Erin Lynch DOB: 10-23-1960 MRN: [id number]   Date of Service  02/27/2020  HPI/Events of Note  Severe Agitation - Request for bilateral soft wrist restraints.   eICU Interventions  Plan: 1. Bilateral soft wrist restraints X 12 hours.  2. Xanax 1 mg PO now extra dose.      Intervention Category Major Interventions: Delirium, psychosis, severe agitation - evaluation and management  Lenell Antu 02/27/2020, 7:37 PM

## 2020-02-27 NOTE — Progress Notes (Addendum)
NAME:  Erin Lynch, MRN:  [id number], DOB:  01-23-1961, LOS: 2 ADMISSION DATE:  02/24/2020, CONSULTATION DATE:  02/25/20 REFERRING MD:  Renaye Rakers, MD, CHIEF COMPLAINT:  shock   Brief History   Multilobar CAP with septic shock, encephalopathy  History of present illness   Ms. Erin Lynch is a 59 y/o woman with a history of COPD and tobacco abuse who has been feeling ill for about a week. She had been feeling better but started feeling worse again. She fell out of a chair this evening and hit her head. EMS was called and she was brought to the ED. She has been coughing, feeling SOB and feverish. She continues to smoke 1ppd and takes Trelegy for COPD. She is not on home oxygen. She takes plavix, but no other AC. Patient lives with husband.  Upon arrival to the ED she was hypoxic and hypotensive. CVC placed, 2L IVF, norepi, antibiotics given.  Past Medical History  COPD Tobacco abuse Fibromyalgia HFpEF CAD HLD HTN hypothyroidism  Significant Hospital Events     Consults:    Procedures:    Significant Diagnostic Tests:  CT chest: RUL, RLL, LLL patchy pneumonia, no significant effusions  Micro Data:  covid negative Blood cx 8/12> Trach aspirate 8/12> RVP neg  Antimicrobials:  Ceftriaxone 8/12> Doxycycline 8/12>  Interim history/subjective:  Off pressors. Reports general pain  Objective   Blood pressure 128/75, pulse 79, temperature 98.7 F (37.1 C), temperature source Oral, resp. rate (!) 27, height 5\' 3"  (1.6 m), weight 80.6 kg, SpO2 100 %.        Intake/Output Summary (Last 24 hours) at 02/27/2020 0943 Last data filed at 02/27/2020 0800 Gross per 24 hour  Intake 2421.18 ml  Output 750 ml  Net 1671.18 ml   Filed Weights   02/25/20 0300 02/26/20 0600  Weight: 81.3 kg 80.6 kg   Physical Exam: General: Chronically ill-appearing, no acute distress, anxious HENT: [place], AT, OP clear, MMM Eyes: EOMI, no scleral icterus Respiratory: Clear to auscultation bilaterally.  No  crackles, wheezing or rales Cardiovascular: RRR, -M/R/G, no JVD GI: BS+, soft, nontender Extremities:-Edema,-tenderness Neuro: AAO x4, CNII-XII grossly intact Skin: Intact, no rashes or bruising Psych: Anxious mood, normal affect  Resolved Hospital Problem list   Acute encephalopathy secondary to benzos and opiates  Assessment & Plan:  Septic shock 2/2 pneumonia c/b bacteremia - resolved Lactic acidosis - resolved -Off pressors -Transitioned to Vanc 8/13 for staph species on blood cx -Follow-up blood cultures for clearance -Echo ordered  UPDATE 8/15@12 :45 RQ+Kleb Pna and BCx CoNS (likely contaminant) -Abx changed to Ceftriaxone  COPD, ongoing tobacco abuse -Duonebs BID -Con't LABA/ICS/LAMA inpatient. Resume home Trelegy at discharge -Continue steroids -Nicotine patch  Acute hypoxic respiratory failure due to pna - improving. On 1L -supplemental O2 to maintain SpO2 >88%; titrate down as able -flutter & IS to promote good pulmonary hygiene  AKI 2/2 ATN - resolved Hypovolemic hyponatremia Hypokalemia -Avoid nephrotoxic agents -Replete K for goal 4  Chronic normocytic anemia -OP workup recommended  H/o HTN, HLD, CAD. Chronic HFpEF -con't statin -PTA antihypertensives on hold while in shock  Hypothyroidism -con't PTA synthroid  Anxiety -Resumed home xanax, cymbalta Best practice:  Diet: regular Pain/Anxiety/Delirium protocol (if indicated): n/a VAP protocol (if indicated): n/a DVT prophylaxis: heparic [place] GI prophylaxis: PTA PPI & H2 Glucose control: SSI  Mobility: OOB w/ assist Code Status: full Family Communication: updated patient 8/15 Disposition: Transfer to floor. [facility] accepted for pick-up 8/16  Labs   CBC: Recent Labs  Lab 02/24/20 2206 02/24/20 2330 02/25/20 0240 02/27/20 0336  WBC  --  38.7* 40.8* 13.6*  NEUTROABS  --  33.2*  --   --   HGB 11.6* 9.3* 10.1* 8.1*  HCT 34.0* 30.3* 32.1* 26.3*  MCV  --  90.7 88.2 89.8  PLT  --  355 363 217     Basic Metabolic Panel: Recent Labs  Lab 02/24/20 2206 02/24/20 2219 02/25/20 0240 02/26/20 0402 02/27/20 0336  NA 134* 133* 134* 139 138  K 2.7* 2.7* 3.3* 3.3* 3.9  CL  --  101 102 106 109  CO2  --  21* 19* 23 21*  GLUCOSE  --  111* 123* 163* 110*  BUN  --  15 16 17 14   CREATININE  --  2.39* 1.96* 1.52* 0.89  CALCIUM  --  8.1* 7.9* 8.3* 8.3*  MG  --   --   --   --  1.7   GFR: Estimated Creatinine Clearance: 66.8 mL/min (by C-G formula based on SCr of 0.89 mg/dL). Recent Labs  Lab 02/24/20 2222 02/24/20 2330 02/25/20 0240 02/26/20 1540 02/26/20 1816 02/27/20 0336  PROCALCITON  --   --  20.98  --   --   --   WBC  --  38.7* 40.8*  --   --  13.6*  LATICACIDVEN 2.7*  --  2.5* 1.4 1.1  --     Liver Function Tests: Recent Labs  Lab 02/24/20 2219  AST 18  ALT 13  ALKPHOS 86  BILITOT 0.5  PROT 5.1*  ALBUMIN 2.5*   No results for input(s): LIPASE, AMYLASE in the last 168 hours. No results for input(s): AMMONIA in the last 168 hours.  ABG    Component Value Date/Time   HCO3 23.0 02/24/2020 2206   TCO2 25 02/24/2020 2206   ACIDBASEDEF 5.0 (H) 02/24/2020 2206   O2SAT 96.0 02/24/2020 2206     Coagulation Profile: Recent Labs  Lab 02/24/20 2330  INR 1.2    Cardiac Enzymes: No results for input(s): CKTOTAL, CKMB, CKMBINDEX, TROPONINI in the last 168 hours.  HbA1C: No results found for: HGBA1C  CBG: Recent Labs  Lab 02/26/20 1622  GLUCAP 130*   CC Time: 40 min  02/28/20, M.D. [facility] 02/27/2020 9:43 AM

## 2020-02-27 NOTE — Progress Notes (Signed)
  Echocardiogram 2D Echocardiogram has been performed.  Erin Lynch 02/27/2020, 1:55 PM

## 2020-02-28 DIAGNOSIS — N179 Acute kidney failure, unspecified: Secondary | ICD-10-CM | POA: Diagnosis present

## 2020-02-28 DIAGNOSIS — J181 Lobar pneumonia, unspecified organism: Secondary | ICD-10-CM

## 2020-02-28 DIAGNOSIS — R410 Disorientation, unspecified: Secondary | ICD-10-CM | POA: Diagnosis present

## 2020-02-28 DIAGNOSIS — J441 Chronic obstructive pulmonary disease with (acute) exacerbation: Secondary | ICD-10-CM | POA: Diagnosis present

## 2020-02-28 LAB — BASIC METABOLIC PANEL
Anion gap: 10 (ref 5–15)
BUN: 14 mg/dL (ref 8–23)
CO2: 23 mmol/L (ref 22–32)
Calcium: 8.8 mg/dL — ABNORMAL LOW (ref 8.9–10.3)
Chloride: 109 mmol/L (ref 98–111)
Creatinine, Ser: 0.87 mg/dL (ref 0.44–1.00)
GFR calc Af Amer: >60 mL/min (ref >60)
GFR calc non Af Amer: >60 mL/min (ref >60)
Glucose, Bld: 89 mg/dL (ref 70–99)
Potassium: 3.8 mmol/L (ref 3.5–5.1)
Sodium: 142 mmol/L (ref 135–145)

## 2020-02-28 MED ORDER — GABAPENTIN 600 MG PO TABS
600.0000 mg | ORAL_TABLET | Freq: Every day | ORAL | Status: DC
  Administered 2020-02-28 – 2020-03-01 (×3): 600 mg via ORAL
  Filled 2020-02-28 (×3): qty 1

## 2020-02-28 MED ORDER — LISINOPRIL 5 MG PO TABS
5.0000 mg | ORAL_TABLET | Freq: Every day | ORAL | Status: DC
  Administered 2020-02-28 – 2020-03-02 (×4): 5 mg via ORAL
  Filled 2020-02-28 (×4): qty 1

## 2020-02-28 MED ORDER — HALOPERIDOL LACTATE 5 MG/ML IJ SOLN
2.0000 mg | Freq: Once | INTRAMUSCULAR | Status: AC
  Administered 2020-02-28: 2 mg via INTRAVENOUS
  Filled 2020-02-28: qty 1

## 2020-02-28 MED ORDER — HYDROCORTISONE NA SUCCINATE PF 100 MG IJ SOLR
50.0000 mg | Freq: Three times a day (TID) | INTRAMUSCULAR | Status: DC
  Administered 2020-02-28 – 2020-02-29 (×3): 50 mg via INTRAVENOUS
  Filled 2020-02-28 (×3): qty 2

## 2020-02-28 MED ORDER — LORAZEPAM 2 MG/ML IJ SOLN
1.0000 mg | Freq: Four times a day (QID) | INTRAMUSCULAR | Status: DC | PRN
  Administered 2020-02-28 – 2020-03-01 (×3): 1 mg via INTRAVENOUS
  Filled 2020-02-28 (×3): qty 1

## 2020-02-28 MED ORDER — MAGNESIUM OXIDE 400 (241.3 MG) MG PO TABS
400.0000 mg | ORAL_TABLET | Freq: Two times a day (BID) | ORAL | Status: DC
  Administered 2020-02-28 – 2020-03-02 (×7): 400 mg via ORAL
  Filled 2020-02-28 (×7): qty 1

## 2020-02-28 MED ORDER — METOPROLOL SUCCINATE ER 25 MG PO TB24
25.0000 mg | ORAL_TABLET | Freq: Every day | ORAL | Status: DC
  Administered 2020-02-28 – 2020-03-02 (×4): 25 mg via ORAL
  Filled 2020-02-28 (×4): qty 1

## 2020-02-28 MED ORDER — OLANZAPINE 5 MG PO TBDP
5.0000 mg | ORAL_TABLET | Freq: Once | ORAL | Status: AC
  Administered 2020-02-28: 5 mg via SUBLINGUAL
  Filled 2020-02-28: qty 1

## 2020-02-28 MED ORDER — LORAZEPAM 2 MG/ML IJ SOLN
1.0000 mg | Freq: Once | INTRAMUSCULAR | Status: AC
  Administered 2020-02-28: 1 mg via INTRAVENOUS
  Filled 2020-02-28: qty 1

## 2020-02-28 MED ORDER — OLANZAPINE 5 MG PO TBDP
5.0000 mg | ORAL_TABLET | Freq: Two times a day (BID) | ORAL | Status: DC | PRN
  Administered 2020-02-28: 5 mg via SUBLINGUAL
  Filled 2020-02-28 (×2): qty 1

## 2020-02-28 NOTE — Progress Notes (Addendum)
PROGRESS NOTE  Erin Lynch ZOX:[id number] DOB: 04-12-1961 DOA: 02/24/2020 PCP: Galvin Proffer, MD   LOS: 3 days   Brief narrative: As per HPI,  Erin Lynch is a 59 years old female with past medical history of COPD and tobacco abuse presented to hospital with complaints of not feeling well and fall.  EMS was called in and was noted to be febrile with shortness of breath.  Patient takes inhalers at home but was not on oxygen.  In the ED, she was noted to be hypoxic and hypotensive.  She was noted to have pneumonia.  Patient required central venous catheter and vasopressors, IV antibiotics and was admitted to the ICU.  Subsequently patient was considered stable for transfer out of the ICU.   Assessment/Plan:  Principal Problem:   Septic shock (HCC) Active Problems:   Hyperlipidemia   Bipolar disorder, curr episode depressed, severe, w/psychotic features (HCC)   Pneumonia   Confusion   AKI (acute kidney injury) (HCC)   COPD with acute exacerbation (HCC)  Septic shock secondary to pneumonia.  Patient received IV fluids vasopressors and broad-spectrum antibiotics.  Was transitioned to IV vancomycin on 8/13 since blood culture was positive for staph species.  Subsequently it was coagulase-negative staph.  Vancomycin was discontinued.  Sputum cultures positive for Klebsiella.  Patient is currently on Rocephin and vancomycin.  We will continue.  Repeat blood cultures have been sent on 02/27/2020.  Follow cultures.  2D echocardiogram on 02/27/2020 showed LV ejection fraction of 55 to 60% with moderately elevated pulmonary artery systolic pressure..  No evidence of valvular vegetation reported.  Agitation confusion, restlessness possible ICU delirium.  Patient received Xanax and 1 dose of Zyprexa yesterday.  QTC borderline prolonged.  Will intermittently dose with the Zyprexa.  Check EKG in a.m.  History of anxiety.  Has been started on Xanax and Cymbalta.  COPD, ongoing tobacco abuse, with acute  exacerbation Not on home oxygen.  Continue LABA/ICS/LAMA inpatient. Resume home Trelegy at discharge Continue steroids.  Continue nicotine patch.  Decrease the dose of hydrocortisone.  Acute hypoxic respiratory failure due to pneumonia. Continue incentive spirometry and flutter valve supplemental oxygen.  Currently on 2 L of nasal cannula oxygen.  We will wean oxygen as able  Acute kidney injury.  On presentation.  Has resolved at this time.  Creatinine of 0.8.  Hypovolemic hyponatremia, Hypokalemia Has improved with IV fluid replacement.  Likely sodium of 142 and potassium 3.8.  Magnesium 1.7 yesterday.  We will add p.o. magnesium oxide  Chronic normocytic anemia Hemoglobin of 8.1 at this time.  Will need outpatient work-up.  CBC in a.m.  History of essential HTN, HLD, CAD. Chronic HFpEF 2D echocardiogram with preserved LV function. We will resume antiantihypertensives from home as tolerated.  Will resume metoprolol and lisinopril at this time.  Check BMP in a.m.  Hypothyroidism Continue synthroid  Restless leg syndrome.  Will resume gabapentin from home.  DVT prophylaxis: heparin injection 5,000 Units Start: 02/25/20 0600 SCDs Start: 02/25/20 0031   Code Status:  Full code  Family Communication: None  Status is: Inpatient  Remains inpatient appropriate because:Unsafe d/c plan, IV treatments appropriate due to intensity of illness or inability to take PO and Inpatient level of care appropriate due to severity of illness   Dispo: The patient is from: Home              Anticipated d/c is to: Home  Anticipated d/c date is: 3 days              Patient currently is not medically stable to d/c.  Consultants:  PCCM  Procedures:  None  Antibiotics:  . Rocephin IV  Anti-infectives (From admission, onward)   Start     Dose/Rate Route Frequency Ordered Stop   02/27/20 1300  cefTRIAXone (ROCEPHIN) 2 g in sodium chloride 0.9 % 100 mL IVPB     Discontinue      2 g 200 mL/hr over 30 Minutes Intravenous Every 24 hours 02/27/20 1255 03/05/20 1259   02/26/20 2200  cefTRIAXone (ROCEPHIN) 2 g in sodium chloride 0.9 % 100 mL IVPB  Status:  Discontinued        2 g 200 mL/hr over 30 Minutes Intravenous Every 24 hours 02/25/20 0001 02/25/20 0907   02/26/20 2200  vancomycin (VANCOCIN) IVPB 1000 mg/200 mL premix  Status:  Discontinued        1,000 mg 200 mL/hr over 60 Minutes Intravenous Every 24 hours 02/25/20 2104 02/27/20 1255   02/25/20 2200  vancomycin (VANCOREADY) IVPB 1750 mg/350 mL        1,750 mg 175 mL/hr over 120 Minutes Intravenous  Once 02/25/20 2104 02/26/20 0049   02/25/20 1200  cefTRIAXone (ROCEPHIN) 2 g in sodium chloride 0.9 % 100 mL IVPB  Status:  Discontinued        2 g 200 mL/hr over 30 Minutes Intravenous Every 24 hours 02/25/20 0907 02/26/20 1150   02/25/20 1000  doxycycline (VIBRAMYCIN) 10 mg in dextrose 5 % 250 mL IVPB  Status:  Discontinued        10 mg 125 mL/hr over 120 Minutes Intravenous Every 12 hours 02/25/20 0039 02/25/20 0907   02/25/20 1000  doxycycline (VIBRAMYCIN) 100 mg in sodium chloride 0.9 % 250 mL IVPB  Status:  Discontinued        100 mg 125 mL/hr over 120 Minutes Intravenous Every 12 hours 02/25/20 0907 02/26/20 1148   02/25/20 0015  cefTRIAXone (ROCEPHIN) 1 g in sodium chloride 0.9 % 100 mL IVPB  Status:  Discontinued        1 g 200 mL/hr over 30 Minutes Intravenous  Once 02/25/20 0009 02/25/20 1140   02/24/20 2230  vancomycin (VANCOCIN) IVPB 1000 mg/200 mL premix  Status:  Discontinued        1,000 mg 200 mL/hr over 60 Minutes Intravenous  Once 02/24/20 2219 02/24/20 2220   02/24/20 2100  cefTRIAXone (ROCEPHIN) 1 g in sodium chloride 0.9 % 100 mL IVPB        1 g 200 mL/hr over 30 Minutes Intravenous  Once 02/24/20 2058 02/24/20 2318   02/24/20 2100  doxycycline (VIBRAMYCIN) 100 mg in sodium chloride 0.9 % 250 mL IVPB        100 mg 125 mL/hr over 120 Minutes Intravenous  Once 02/24/20 2058 02/25/20 0124        Subjective: Today, patient was seen and examined at bedside.  Nursing staff reported patient's agitation and confusion.  Patient on restraints.  Patient is alert awake and communicative but  confused.  Patient states that he has been kidnapped.  Objective: Vitals:   02/28/20 0442 02/28/20 0918  BP: 126/90 (!) 152/78  Pulse: 79 62  Resp: 19 20  Temp: 98.9 F (37.2 C) 99.3 F (37.4 C)  SpO2: 99% 100%    Intake/Output Summary (Last 24 hours) at 02/28/2020 1019 Last data filed at 02/28/2020 0900 Gross per 24 hour  Intake 460 ml  Output 851 ml  Net -391 ml   Filed Weights   02/25/20 0300 02/26/20 0600 02/27/20 1134  Weight: 81.3 kg 80.6 kg 82.1 kg   Body mass index is 32.06 kg/m.   Physical Exam: GENERAL: Patient is alert awake and oriented to place and person.  Not in obvious distress, on nasal cannula oxygen intermittently confused and agitated on restraints.Marland Kitchen HENT: No scleral pallor or icterus. Pupils equally reactive to light. Oral mucosa is moist NECK: is supple, no gross swelling noted. CHEST:   Diminished breath sounds bilaterally. CVS: S1 and S2 heard, no murmur. Regular rate and rhythm.  ABDOMEN: Soft, non-tender, bowel sounds are present. EXTREMITIES: No edema.  On soft wrist restraints. CNS: Cranial nerves are intact. No focal motor deficits.  Anxious mood. SKIN: warm and dry without rashes.  Data Review: I have personally reviewed the following laboratory data and studies,  CBC: Recent Labs  Lab 02/24/20 2206 02/24/20 2330 02/25/20 0240 02/27/20 0336  WBC  --  38.7* 40.8* 13.6*  NEUTROABS  --  33.2*  --   --   HGB 11.6* 9.3* 10.1* 8.1*  HCT 34.0* 30.3* 32.1* 26.3*  MCV  --  90.7 88.2 89.8  PLT  --  355 363 217   Basic Metabolic Panel: Recent Labs  Lab 02/24/20 2219 02/25/20 0240 02/26/20 0402 02/27/20 0336 02/28/20 0319  NA 133* 134* 139 138 142  K 2.7* 3.3* 3.3* 3.9 3.8  CL 101 102 106 109 109  CO2 21* 19* 23 21* 23  GLUCOSE 111* 123*  163* 110* 89  BUN 15 16 17 14 14   CREATININE 2.39* 1.96* 1.52* 0.89 0.87  CALCIUM 8.1* 7.9* 8.3* 8.3* 8.8*  MG  --   --   --  1.7  --    Liver Function Tests: Recent Labs  Lab 02/24/20 2219  AST 18  ALT 13  ALKPHOS 86  BILITOT 0.5  PROT 5.1*  ALBUMIN 2.5*   No results for input(s): LIPASE, AMYLASE in the last 168 hours. No results for input(s): AMMONIA in the last 168 hours. Cardiac Enzymes: No results for input(s): CKTOTAL, CKMB, CKMBINDEX, TROPONINI in the last 168 hours. BNP (last 3 results) Recent Labs    02/24/20 2330  BNP 201.2*    ProBNP (last 3 results) No results for input(s): PROBNP in the last 8760 hours.  CBG: Recent Labs  Lab 02/26/20 1622 02/27/20 1932  GLUCAP 130* 92   Recent Results (from the past 240 hour(s))  Blood Culture (routine x 2)     Status: Abnormal   Collection Time: 02/24/20 12:21 AM   Specimen: BLOOD RIGHT ARM  Result Value Ref Range Status   Specimen Description BLOOD RIGHT ARM  Final   Special Requests   Final    BOTTLES DRAWN AEROBIC AND ANAEROBIC Blood Culture adequate volume   Culture  Setup Time   Final    GRAM POSITIVE COCCI IN CLUSTERS AEROBIC BOTTLE ONLY Organism ID to follow CRITICAL RESULT CALLED TO, READ BACK BY AND VERIFIED WITH: F,WILSON PHARMD @2009  02/25/20 EB    Culture (A)  Final    STAPHYLOCOCCUS SPECIES (COAGULASE NEGATIVE) THE SIGNIFICANCE OF ISOLATING THIS ORGANISM FROM A SINGLE SET OF BLOOD CULTURES WHEN MULTIPLE SETS ARE DRAWN IS UNCERTAIN. PLEASE NOTIFY THE MICROBIOLOGY DEPARTMENT WITHIN ONE WEEK IF SPECIATION AND SENSITIVITIES ARE REQUIRED. Performed at [facility], [address] [place], [address] [place]    Report Status 02/26/2020 FINAL  Final  Blood  Culture ID Panel (Reflexed)     Status: Abnormal   Collection Time: 02/24/20 12:21 AM  Result Value Ref Range Status   Enterococcus faecalis NOT DETECTED NOT DETECTED Final   Enterococcus Faecium NOT DETECTED NOT DETECTED Final   Listeria  monocytogenes NOT DETECTED NOT DETECTED Final   Staphylococcus species DETECTED (A) NOT DETECTED Final    Comment: RESULT CALLED TO, READ BACK BY AND VERIFIED WITH: F,WILSON PHARMD @2009  02/25/20 EB    Staphylococcus aureus (BCID) NOT DETECTED NOT DETECTED Final   Staphylococcus epidermidis NOT DETECTED NOT DETECTED Final   Staphylococcus lugdunensis NOT DETECTED NOT DETECTED Final   Streptococcus species NOT DETECTED NOT DETECTED Final   Streptococcus agalactiae NOT DETECTED NOT DETECTED Final   Streptococcus pneumoniae NOT DETECTED NOT DETECTED Final   Streptococcus pyogenes NOT DETECTED NOT DETECTED Final   A.calcoaceticus-baumannii NOT DETECTED NOT DETECTED Final   Bacteroides fragilis NOT DETECTED NOT DETECTED Final   Enterobacterales NOT DETECTED NOT DETECTED Final   Enterobacter cloacae complex NOT DETECTED NOT DETECTED Final   Escherichia coli NOT DETECTED NOT DETECTED Final   Klebsiella aerogenes NOT DETECTED NOT DETECTED Final   Klebsiella oxytoca NOT DETECTED NOT DETECTED Final   Klebsiella pneumoniae NOT DETECTED NOT DETECTED Final   Proteus species NOT DETECTED NOT DETECTED Final   Salmonella species NOT DETECTED NOT DETECTED Final   Serratia marcescens NOT DETECTED NOT DETECTED Final   Haemophilus influenzae NOT DETECTED NOT DETECTED Final   Neisseria meningitidis NOT DETECTED NOT DETECTED Final   Pseudomonas aeruginosa NOT DETECTED NOT DETECTED Final   Stenotrophomonas maltophilia NOT DETECTED NOT DETECTED Final   Candida albicans NOT DETECTED NOT DETECTED Final   Candida auris NOT DETECTED NOT DETECTED Final   Candida glabrata NOT DETECTED NOT DETECTED Final   Candida krusei NOT DETECTED NOT DETECTED Final   Candida parapsilosis NOT DETECTED NOT DETECTED Final   Candida tropicalis NOT DETECTED NOT DETECTED Final   Cryptococcus neoformans/gattii NOT DETECTED NOT DETECTED Final    Comment: Performed at [facility], [address], [place], [place]   SARS Coronavirus 2 by RT PCR (hospital order, performed in [facility] hospital lab) Nasopharyngeal Nasopharyngeal Swab     Status: None   Collection Time: 02/24/20  8:23 PM   Specimen: Nasopharyngeal Swab  Result Value Ref Range Status   SARS Coronavirus 2 NEGATIVE NEGATIVE Final    Comment: (NOTE) SARS-CoV-2 target nucleic acids are NOT DETECTED.  The SARS-CoV-2 RNA is generally detectable in upper and lower respiratory specimens during the acute phase of infection. The lowest concentration of SARS-CoV-2 viral copies this assay can detect is 250 copies / mL. A negative result does not preclude SARS-CoV-2 infection and should not be used as the sole basis for treatment or other patient management decisions.  A negative result may occur with improper specimen collection / handling, submission of specimen other than nasopharyngeal swab, presence of viral mutation(s) within the areas targeted by this assay, and inadequate number of viral copies (<250 copies / mL). A negative result must be combined with clinical observations, patient history, and epidemiological information.  Fact Sheet for Patients:   04/25/20  Fact Sheet for Healthcare Providers: BoilerBrush.com.cy  This test is not yet approved or  cleared by the https://pope.com/ FDA and has been authorized for detection and/or diagnosis of SARS-CoV-2 by FDA under an Emergency Use Authorization (EUA).  This EUA will remain in effect (meaning this test can be used) for the duration of the  COVID-19 declaration under Section 564(b)(1) of the Act, 21 U.S.C. section 360bbb-3(b)(1), unless the authorization is terminated or revoked sooner.  Performed at [facility], [address] [place], [place] [address]   Blood Culture (routine x 2)     Status: None (Preliminary result)   Collection Time: 02/24/20 10:27 PM   Specimen: BLOOD  Result Value Ref Range Status   Specimen  Description   Final    BLOOD LEFT ANTECUBITAL Performed at [facility], [address] [place], [place] [address]    Special Requests   Final    BOTTLES DRAWN AEROBIC AND ANAEROBIC Blood Culture adequate volume Performed at [facility], [address] [place], [place] [address]    Culture   Final    NO GROWTH 3 DAYS Performed at [facility], [address] [place], [place] [address]    Report Status PENDING  Incomplete  Expectorated sputum assessment w rflx to resp cult     Status: None   Collection Time: 02/25/20  1:30 AM   Specimen: Expectorated Sputum  Result Value Ref Range Status   Specimen Description EXPECTORATED SPUTUM  Final   Special Requests NONE  Final   Sputum evaluation   Final    THIS SPECIMEN IS ACCEPTABLE FOR SPUTUM CULTURE Performed at [facility], [address] [place], [place] [address]    Report Status 02/25/2020 FINAL  Final  Culture, respiratory     Status: None   Collection Time: 02/25/20  1:30 AM  Result Value Ref Range Status   Specimen Description EXPECTORATED SPUTUM  Final   Special Requests NONE Reflexed from [id number]  Final   Gram Stain   Final    ABUNDANT WBC PRESENT, PREDOMINANTLY PMN MODERATE GRAM POSITIVE COCCI FEW GRAM NEGATIVE RODS Performed at [facility], [address] [place], [place] [address]    Culture MODERATE KLEBSIELLA PNEUMONIAE  Final   Report Status 02/27/2020 FINAL  Final   Organism ID, Bacteria KLEBSIELLA PNEUMONIAE  Final      Susceptibility   Klebsiella pneumoniae - MIC*    AMPICILLIN RESISTANT Resistant     CEFAZOLIN <=4 SENSITIVE Sensitive     CEFEPIME <=0.12 SENSITIVE Sensitive     CEFTAZIDIME <=1 SENSITIVE Sensitive     CEFTRIAXONE <=0.25 SENSITIVE Sensitive     CIPROFLOXACIN <=0.25 SENSITIVE Sensitive     GENTAMICIN <=1 SENSITIVE Sensitive     IMIPENEM <=0.25 SENSITIVE Sensitive     TRIMETH/SULFA <=20 SENSITIVE Sensitive     AMPICILLIN/SULBACTAM 4 SENSITIVE Sensitive     PIP/TAZO  <=4 SENSITIVE Sensitive     * MODERATE KLEBSIELLA PNEUMONIAE  MRSA PCR Screening     Status: Abnormal   Collection Time: 02/25/20  2:21 AM   Specimen: Nasopharyngeal  Result Value Ref Range Status   MRSA by PCR POSITIVE (A) NEGATIVE Final    Comment:        The GeneXpert MRSA Assay (FDA approved for NASAL specimens only), is one component of a comprehensive MRSA colonization surveillance program. It is not intended to diagnose MRSA infection nor to guide or monitor treatment for MRSA infections. RESULT CALLED TO, READ BACK BY AND VERIFIED WITH: Baldo AshM HAGANS 02/25/20 0500 JDW Performed at [facility], [address] [place], [place] [address]   Respiratory Panel by PCR     Status: None   Collection Time: 02/26/20  3:02 PM   Specimen: Nasopharyngeal Swab; Respiratory  Result Value Ref Range  Status   Adenovirus NOT DETECTED NOT DETECTED Final   Coronavirus 229E NOT DETECTED NOT DETECTED Final    Comment: (NOTE) The Coronavirus on the Respiratory Panel, DOES NOT test for the novel  Coronavirus (2019 nCoV)    Coronavirus HKU1 NOT DETECTED NOT DETECTED Final   Coronavirus NL63 NOT DETECTED NOT DETECTED Final   Coronavirus OC43 NOT DETECTED NOT DETECTED Final   Metapneumovirus NOT DETECTED NOT DETECTED Final   Rhinovirus / Enterovirus NOT DETECTED NOT DETECTED Final   Influenza A NOT DETECTED NOT DETECTED Final   Influenza B NOT DETECTED NOT DETECTED Final   Parainfluenza Virus 1 NOT DETECTED NOT DETECTED Final   Parainfluenza Virus 2 NOT DETECTED NOT DETECTED Final   Parainfluenza Virus 3 NOT DETECTED NOT DETECTED Final   Parainfluenza Virus 4 NOT DETECTED NOT DETECTED Final   Respiratory Syncytial Virus NOT DETECTED NOT DETECTED Final   Bordetella pertussis NOT DETECTED NOT DETECTED Final   Chlamydophila pneumoniae NOT DETECTED NOT DETECTED Final   Mycoplasma pneumoniae NOT DETECTED NOT DETECTED Final    Comment: Performed at [facility], [address]  [place], [place] [address]  Culture, blood (routine x 2)     Status: None (Preliminary result)   Collection Time: 02/27/20  5:09 AM   Specimen: BLOOD  Result Value Ref Range Status   Specimen Description BLOOD RIGHT ARM  Final   Special Requests   Final    BOTTLES DRAWN AEROBIC ONLY Blood Culture adequate volume   Culture   Final    NO GROWTH 1 DAY Performed at [facility], [address] [place], [place] [address]    Report Status PENDING  Incomplete  Culture, blood (routine x 2)     Status: None (Preliminary result)   Collection Time: 02/27/20  5:15 AM   Specimen: BLOOD  Result Value Ref Range Status   Specimen Description BLOOD LEFT ARM  Final   Special Requests   Final    BOTTLES DRAWN AEROBIC AND ANAEROBIC Blood Culture adequate volume   Culture   Final    NO GROWTH 1 DAY Performed at [facility], [address] [place], [place] [address]    Report Status PENDING  Incomplete     Studies: ECHOCARDIOGRAM COMPLETE  Result Date: 02/27/2020    ECHOCARDIOGRAM REPORT   Patient Name:   KEA CALLAN Date of Exam: 02/27/2020 Medical Rec #:  [id number]   Height:       63.0 in Accession #:    [id number]  Weight:       181.0 lb Date of Birth:  [birth year]    BSA:          1.853 m Patient Age:    59 years    BP:           152/88 mmHg Patient Gender: F           HR:           62 bpm. Exam Location:  Inpatient Procedure: 2D Echo Indications:    bacteremia  History:        Patient has prior history of Echocardiogram examinations, most                 recent 02/01/2019. CHF, CAD; Risk Factors:Hypertension,                 Dyslipidemia and Current Smoker.  Sonographer:    Celene Skeen RDCS (AE) Referring Phys: [phone] [facility] Mechele Collin  Sonographer Comments: Image acquisition challenging due to respiratory motion and Image acquisition challenging due to patient body habitus. IMPRESSIONS  1. Left ventricular ejection fraction, by estimation, is 55 to 60%. The left ventricle has normal function.  The left ventricle has no regional wall motion abnormalities. The left ventricular internal cavity size was mildly dilated. Left ventricular diastolic parameters were normal.  2. Right ventricular systolic function is normal. The right ventricular size is normal. There is moderately elevated pulmonary artery systolic pressure.  3. The mitral valve is normal in structure. Trivial mitral valve regurgitation. No evidence of mitral stenosis.  4. Tricuspid valve regurgitation is mild to moderate.  5. The aortic valve has an indeterminant number of cusps. Aortic valve regurgitation is mild to moderate.  6. The inferior vena cava is dilated in size with <50% respiratory variability, suggesting right atrial pressure of 15 mmHg. Conclusion(s)/Recommendation(s): No evidence of valvular vegetations on this transthoracic echocardiogram. Would recommend a transesophageal echocardiogram to exclude infective endocarditis if clinically indicated. FINDINGS  Left Ventricle: Left ventricular ejection fraction, by estimation, is 55 to 60%. The left ventricle has normal function. The left ventricle has no regional wall motion abnormalities. The left ventricular internal cavity size was mildly dilated. There is  borderline left ventricular hypertrophy. Left ventricular diastolic parameters were normal. Right Ventricle: The right ventricular size is normal. No increase in right ventricular wall thickness. Right ventricular systolic function is normal. There is moderately elevated pulmonary artery systolic pressure. The tricuspid regurgitant velocity is 2.81 m/s, and with an assumed right atrial pressure of 15 mmHg, the estimated right ventricular systolic pressure is 46.6 mmHg. Left Atrium: Left atrial size was normal in size. Right Atrium: Right atrial size was normal in size. Pericardium: There is no evidence of pericardial effusion. Mitral Valve: The mitral valve is normal in structure. Trivial mitral valve regurgitation. No evidence  of mitral valve stenosis. Tricuspid Valve: The tricuspid valve is normal in structure. Tricuspid valve regurgitation is mild to moderate. Aortic Valve: The aortic valve has an indeterminant number of cusps. . There is mild thickening and mild calcification of the aortic valve. Aortic valve regurgitation is mild to moderate. There is mild thickening of the aortic valve. There is mild calcification of the aortic valve. Pulmonic Valve: The pulmonic valve was not well visualized. Pulmonic valve regurgitation is not visualized. Aorta: The ascending aorta was not well visualized and the aortic arch was not well visualized. Venous: The inferior vena cava is dilated in size with less than 50% respiratory variability, suggesting right atrial pressure of 15 mmHg. IAS/Shunts: No atrial level shunt detected by color flow Doppler.  LEFT VENTRICLE PLAX 2D LVIDd:         5.33 cm  Diastology LVIDs:         3.87 cm  LV e' lateral:   9.14 cm/s LV PW:         1.10 cm  LV E/e' lateral: 12.1 LV IVS:        1.10 cm  LV e' medial:    8.38 cm/s LVOT diam:     1.80 cm  LV E/e' medial:  13.2 LV SV:         67 LV SV Index:   36 LVOT Area:     2.54 cm  RIGHT VENTRICLE RV S prime:     9.36 cm/s TAPSE (M-mode): 2.6 cm LEFT ATRIUM           Index       RIGHT ATRIUM  Index LA diam:      3.60 cm 1.94 cm/m  RA Area:     13.70 cm LA Vol (A2C): 47.0 ml 25.36 ml/m RA Volume:   35.60 ml  19.21 ml/m LA Vol (A4C): 26.2 ml 14.14 ml/m  AORTIC VALVE LVOT Vmax:   109.00 cm/s LVOT Vmean:  78.000 cm/s LVOT VTI:    0.265 m  AORTA Ao Root diam: 3.10 cm MITRAL VALVE                TRICUSPID VALVE MV Area (PHT): 4.80 cm     TR Peak grad:   31.6 mmHg MV Decel Time: 158 msec     TR Vmax:        281.00 cm/s MV E velocity: 111.00 cm/s MV A velocity: 100.00 cm/s  SHUNTS MV E/A ratio:  1.11         Systemic VTI:  0.26 m                             Systemic Diam: 1.80 cm Jodelle Red MD Electronically signed by Jodelle Red MD Signature  Date/Time: 02/27/2020/3:54:13 PM    Final       Joycelyn Das, MD  [facility] 02/28/2020

## 2020-02-28 NOTE — Evaluation (Signed)
Physical Therapy Evaluation Patient Details Name: Erin Lynch MRN: [id number] DOB: 23-Dec-1960 Today's Date: 02/28/2020   History of Present Illness  Ms. Lorman is a 59 y/o woman with a history of COPD and tobacco abuse who has been feeling ill for about a week prior to admission. She had been feeling better but started feeling worse again. She fell out of a chair 8/12 and hit her head. Upon arrival to the ED she was hypoxic and hypotensive; hospital course complicated by confusion and agitation  Clinical Impression   Pt admitted with above diagnosis. Comes from home where she lives with her husband; At time of PT eval, her PLOF isn't entirely clear as pt is a difficult historian; Presents to PT with decr cognition, generalized weakness, decr independence and safety with ADLs and mobility; Decr cognition, hyperfocused on leaving, but still able to stand, and take march in place steps at [facility] with handheld assist; Discussed with RN, who indicated Ms. Colasurdo has been focused on trying to get out of her restraints as well, at times with success -- this does indicate goal-directed behavior and problem-solving capacity; Worth considering CIR for post acute rehab to maximize independence and safety with mobility and ADLs prior to dc home; If her confusion clears quickly, will update rec to dc home as appropriate;  Pt currently with functional limitations due to the deficits listed below (see PT Problem List). Pt will benefit from skilled PT to increase their independence and safety with mobility to allow discharge to the venue listed below.       Follow Up Recommendations CIR    Equipment Recommendations  Other (comment) (to be determined)    Recommendations for Other Services OT consult (will order per protocol)     Precautions / Restrictions Precautions Precautions: Fall Precaution Comments: At this time, requires restraints for safety (Tends to reach for her IJ catheter)      Mobility  Bed  Mobility Overal bed mobility: Needs Assistance Bed Mobility: Supine to Sit;Sit to Supine     Supine to sit: Mod assist;+2 for safety/equipment Sit to supine: Mod assist;+2 for safety/equipment   General bed mobility comments: Cues to initiate and for sequencing; mod assist to help LEs clear the bed, and mod handheld assist to pull to sit; second person helpful for safety  Transfers Overall transfer level: Needs assistance Equipment used: 2 person hand held assist Transfers: Sit to/from Stand Sit to Stand: Mod assist         General transfer comment: Light mod assist to steady; kept   Ambulation/Gait Ambulation/Gait assistance: Min assist;+2 safety/equipment Gait Distance (Feet):  (Sidesteps toward [facility]) Assistive device: 2 person hand held assist       General Gait Details: Handheld assist for support and balance; overall smooth weight shifts for stepping; Unable to incr amb distance due to agitation  Stairs            Wheelchair Mobility    Modified Rankin (Stroke Patients Only)       Balance Overall balance assessment: Needs assistance           Standing balance-Leahy Scale: Poor (approaching Fair)                               Pertinent Vitals/Pain Pain Assessment: Faces Faces Pain Scale: Hurts little more Pain Location: Generalized; grimace likely more related to her restlessness Pain Descriptors / Indicators: Grimacing Pain Intervention(s): Monitored during session  Home Living Family/patient expects to be discharged to:: Private residence Living Arrangements: Spouse/significant other               Additional Comments: At time of eval, pt unable to give reliable information re: home setup and available assist    Prior Function           Comments: Unknown at this time; need more information     Hand Dominance        Extremity/Trunk Assessment   Upper Extremity Assessment Upper Extremity Assessment: Difficult to  assess due to impaired cognition (though notably adequate ROM and strength to work self out of mitts and wrist restraints)    Lower Extremity Assessment Lower Extremity Assessment: Generalized weakness       Communication   Communication: No difficulties  Cognition Arousal/Alertness: Awake/alert Behavior During Therapy: Restless;Impulsive (Borderline agitated) Overall Cognitive Status: Impaired/Different from baseline Area of Impairment: Attention;Following commands;Safety/judgement;Awareness;Problem solving                   Current Attention Level:  (approaching sustained)   Following Commands: Follows one step commands inconsistently Safety/Judgement: Decreased awareness of safety;Decreased awareness of deficits   Problem Solving: Difficulty sequencing;Requires verbal cues;Requires tactile cues General Comments: Fixated on trying to leave the hospital, repeating how much she wants to leave, but still following about 75% of commands related to mobility; Close guard for safety due to impulsivity      General Comments      Exercises     Assessment/Plan    PT Assessment Patient needs continued PT services  PT Problem List Decreased strength;Decreased activity tolerance;Decreased balance;Decreased mobility;Decreased coordination;Decreased cognition;Decreased knowledge of use of DME;Decreased safety awareness;Decreased knowledge of precautions       PT Treatment Interventions DME instruction;Gait training;Stair training;Functional mobility training;Therapeutic activities;Therapeutic exercise;Balance training;Neuromuscular re-education;Cognitive remediation;Patient/family education    PT Goals (Current goals can be found in the Care Plan section)  Acute Rehab PT Goals Patient Stated Goal: Strongly indicates she wants to leave the hospital PT Goal Formulation: Patient unable to participate in goal setting Time For Goal Achievement: 03/13/20 Potential to Achieve Goals:  Good    Frequency Min 3X/week   Barriers to discharge        Co-evaluation               AM-PAC PT "6 Clicks" Mobility  Outcome Measure Help needed turning from your back to your side while in a flat bed without using bedrails?: A Little Help needed moving from lying on your back to sitting on the side of a flat bed without using bedrails?: A Little Help needed moving to and from a bed to a chair (including a wheelchair)?: A Lot Help needed standing up from a chair using your arms (e.g., wheelchair or bedside chair)?: A Lot Help needed to walk in hospital room?: A Lot Help needed climbing 3-5 steps with a railing? : A Lot 6 Click Score: 14    End of Session Equipment Utilized During Treatment: Gait belt Activity Tolerance: Patient tolerated treatment well Patient left: in bed;with call bell/phone within reach;with bed alarm set;with restraints reapplied Nurse Communication: Mobility status PT Visit Diagnosis: Unsteadiness on feet (R26.81);Other abnormalities of gait and mobility (R26.89);Other symptoms and signs involving the nervous system (N30.051)    Time: 1021-1173 PT Time Calculation (min) (ACUTE ONLY): 14 min   Charges:   PT Evaluation $PT Eval Moderate Complexity: 1 Mod          Van Clines, PT  Acute Rehabilitation Services Pager [phone] Office [phone]   Levi Aland 02/28/2020, 4:28 PM

## 2020-02-28 NOTE — Plan of Care (Signed)
°  Problem: Respiratory: °Goal: Ability to maintain a clear airway will improve °Outcome: Progressing °  °
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
## 2020-02-28 NOTE — Progress Notes (Signed)
eLink Physician-Brief Progress Note Patient Name: Erin Lynch DOB: [birth year] MRN: [id number]   Date of Service  02/28/2020  HPI/Events of Note  Paranoid ideation -  QTc interval = 0.475 seconds.   eICU Interventions  Plan: 1. Zyprexa zydis 5 mg SL now.      Intervention Category Major Interventions: Other:  Lenell Antu 02/28/2020, 3:13 AM
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
## 2020-02-29 DIAGNOSIS — E7849 Other hyperlipidemia: Secondary | ICD-10-CM

## 2020-02-29 DIAGNOSIS — Z452 Encounter for adjustment and management of vascular access device: Secondary | ICD-10-CM

## 2020-02-29 DIAGNOSIS — F315 Bipolar disorder, current episode depressed, severe, with psychotic features: Secondary | ICD-10-CM

## 2020-02-29 DIAGNOSIS — E876 Hypokalemia: Secondary | ICD-10-CM

## 2020-02-29 DIAGNOSIS — R6520 Severe sepsis without septic shock: Secondary | ICD-10-CM

## 2020-02-29 LAB — COMPREHENSIVE METABOLIC PANEL
ALT: 16 U/L (ref 0–44)
AST: 16 U/L (ref 15–41)
Albumin: 2.5 g/dL — ABNORMAL LOW (ref 3.5–5.0)
Alkaline Phosphatase: 70 U/L (ref 38–126)
Anion gap: 9 (ref 5–15)
BUN: 17 mg/dL (ref 8–23)
CO2: 23 mmol/L (ref 22–32)
Calcium: 8.7 mg/dL — ABNORMAL LOW (ref 8.9–10.3)
Chloride: 112 mmol/L — ABNORMAL HIGH (ref 98–111)
Creatinine, Ser: 0.79 mg/dL (ref 0.44–1.00)
GFR calc Af Amer: >60 mL/min (ref >60)
GFR calc non Af Amer: >60 mL/min (ref >60)
Glucose, Bld: 90 mg/dL (ref 70–99)
Potassium: 3.4 mmol/L — ABNORMAL LOW (ref 3.5–5.1)
Sodium: 144 mmol/L (ref 135–145)
Total Bilirubin: 0.5 mg/dL (ref 0.3–1.2)
Total Protein: 4.9 g/dL — ABNORMAL LOW (ref 6.5–8.1)

## 2020-02-29 LAB — CBC
HCT: 27.7 % — ABNORMAL LOW (ref 36.0–46.0)
Hemoglobin: 8.5 g/dL — ABNORMAL LOW (ref 12.0–15.0)
MCH: 27.7 pg (ref 26.0–34.0)
MCHC: 30.7 g/dL (ref 30.0–36.0)
MCV: 90.2 fL (ref 80.0–100.0)
Platelets: 243 10*3/uL (ref 150–400)
RBC: 3.07 MIL/uL — ABNORMAL LOW (ref 3.87–5.11)
RDW: 14.9 % (ref 11.5–15.5)
WBC: 6.7 10*3/uL (ref 4.0–10.5)
nRBC: 0 % (ref 0.0–0.2)

## 2020-02-29 LAB — MAGNESIUM: Magnesium: 2.2 mg/dL (ref 1.7–2.4)

## 2020-02-29 MED ORDER — HYDROCORTISONE NA SUCCINATE PF 100 MG IJ SOLR
50.0000 mg | Freq: Two times a day (BID) | INTRAMUSCULAR | Status: DC
  Administered 2020-02-29 – 2020-03-01 (×2): 50 mg via INTRAVENOUS
  Filled 2020-02-29 (×2): qty 2

## 2020-02-29 MED ORDER — SODIUM CHLORIDE 0.9% FLUSH
10.0000 mL | INTRAVENOUS | Status: DC | PRN
  Administered 2020-03-01: 10 mL

## 2020-02-29 MED ORDER — CHLORHEXIDINE GLUCONATE CLOTH 2 % EX PADS
6.0000 | MEDICATED_PAD | Freq: Every day | CUTANEOUS | Status: DC
  Administered 2020-02-29 – 2020-03-01 (×2): 6 via TOPICAL

## 2020-02-29 MED ORDER — POTASSIUM CHLORIDE CRYS ER 20 MEQ PO TBCR
40.0000 meq | EXTENDED_RELEASE_TABLET | Freq: Once | ORAL | Status: AC
  Administered 2020-02-29: 40 meq via ORAL
  Filled 2020-02-29: qty 2

## 2020-02-29 MED ORDER — ALUM & MAG HYDROXIDE-SIMETH 200-200-20 MG/5ML PO SUSP: 30.0000 mL | Freq: Four times a day (QID) | ORAL | Status: DC | PRN

## 2020-02-29 NOTE — Progress Notes (Signed)
Patient is alert to self,her surrounding and persons,she is following directions at this time.Patient's husband at the bedside.Nurse explained to patient and her husband plan off from restraint trial.Furher explained to her that if she wants to be off from restraint ,she needs to be on her best behavior. Bilateral wrist restraint discontinue at this time.Patient's husband at the bedside.Will monitor.
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
## 2020-02-29 NOTE — Progress Notes (Signed)
Inpatient Rehabilitation Admissions Coordinator  Inpatient rehab consult received. I met with patient at bedside for assessment. Patient currently not at a level to be considered for intensive therapy at CIR. I will follow her progress.  Danne Baxter, RN, MSN Rehab Admissions Coordinator [phone] 02/29/2020 9:51 AM

## 2020-02-29 NOTE — Progress Notes (Addendum)
PROGRESS NOTE  Erin Lynch:[id number] DOB: [birth year] DOA: 02/24/2020 PCP: Galvin Proffer, MD   LOS: 4 days   Brief narrative: As per HPI,  Erin Lynch is a 59 years old female with past medical history of COPD and tobacco abuse presented to hospital with complaints of not feeling well and fall.  EMS was called in and was noted to be febrile with shortness of breath.  Patient takes inhalers at home but was not on oxygen.  In the ED, she was noted to be hypoxic and hypotensive.  She was noted to have pneumonia.  Patient required central venous catheter and vasopressors, IV antibiotics and was admitted to the ICU.  Subsequently, patient was considered stable for transfer out of the ICU.   Assessment/Plan:  Principal Problem:   Septic shock (HCC) Active Problems:   Hyperlipidemia   Bipolar disorder, curr episode depressed, severe, w/psychotic features (HCC)   Pneumonia   Confusion   AKI (acute kidney injury) (HCC)   COPD with acute exacerbation (HCC)  Septic shock secondary to pneumonia.  Patient initially received IV fluids, vasopressors and broad-spectrum antibiotics.  Currently improved.  Patient was transitioned to IV vancomycin on 8/13 since blood culture was initially positive for staph species.  Subsequently, it was coagulase-negative staph so vancomycin was discontinued.  Sputum cultures positive for Klebsiella.  Patient is currently on Rocephin..  Repeat blood cultures from 02/27/2020 negative in 2 days. 2D echocardiogram on 02/27/2020 showed LV ejection fraction of 55 to 60% with moderately elevated pulmonary artery systolic pressure. No evidence of valvular vegetation reported.  Agitation confusion, restlessness possible ICU delirium.  History of anxiety and psychotic illness in the past.  Continue Xanax from home.  QTC borderline prolonged at  474   Patient is already on Jordan.  On Ativan IV with better sedation today.  Will get psychiatry consultation to manage her agitation and  psychiatric disorder.   History of anxiety, bipolar disorder with psychotic features.   on Xanax and Cymbalta.  On Latuda as well.  We will get psychiatry evaluation at this time due to ongoing restlessness agitation and delusional ideation.  COPD, ongoing tobacco abuse, with acute exacerbation Not on home oxygen.  Continue LABA/ICS/LAMA inpatient. Resume home Trelegy at discharge Continue steroids will continue to decrease the dose at this time due to underlying confusion..  Continue nicotine patch.    Acute hypoxic respiratory failure due to pneumonia. Continue incentive spirometry and flutter valve. Currently on 2 L of nasal cannula oxygen.  We will wean oxygen as able  Acute kidney injury.  On presentation.  Has resolved at this time.  Creatinine of 0.7.  Hypovolemic hyponatremia, Has improved with IV fluid replacement.  Monitor BMP.   Hypokalemia.  Mild today.  Will replace.  Check levels in a.m.  Chronic normocytic anemia Hemoglobin of 8.5 at this time.  No evidence of bleeding.  History of essential HTN, HLD, CAD. Chronic HFpEF 2D echocardiogram with preserved LV function.  Resumed metoprolol and lisinopril   Check blood pressure seems to be stable.  Hypothyroidism Continue synthroid  Restless leg syndrome.  Resumed gabapentin from home.  DVT prophylaxis: heparin injection 5,000 Units Start: 02/25/20 0600 SCDs Start: 02/25/20 0031   Code Status:  Full code  Family Communication: Spoke with the patient's husband on the phone and updated him about the clinical condition of the patient.   Status is: Inpatient  Remains inpatient appropriate because:Unsafe d/c plan, IV treatments appropriate due to intensity of illness or inability  to take PO and Inpatient level of care appropriate due to severity of illness   Dispo: The patient is from: Home              Anticipated d/c is to: Home with home health versus skilled nursing facility.     PT and OT evaluation pending.           Anticipated d/c date is: 2-3 days              Patient currently is not medically stable to d/c.  Consultants:  PCCM  Psychiatry -consult placed in today  Procedures:  None  Antibiotics:  . Rocephin IV  Anti-infectives (From admission, onward)   Start     Dose/Rate Route Frequency Ordered Stop   02/27/20 1300  cefTRIAXone (ROCEPHIN) 2 g in sodium chloride 0.9 % 100 mL IVPB     Discontinue     2 g 200 mL/hr over 30 Minutes Intravenous Every 24 hours 02/27/20 1255 03/05/20 1259   02/26/20 2200  cefTRIAXone (ROCEPHIN) 2 g in sodium chloride 0.9 % 100 mL IVPB  Status:  Discontinued        2 g 200 mL/hr over 30 Minutes Intravenous Every 24 hours 02/25/20 0001 02/25/20 0907   02/26/20 2200  vancomycin (VANCOCIN) IVPB 1000 mg/200 mL premix  Status:  Discontinued        1,000 mg 200 mL/hr over 60 Minutes Intravenous Every 24 hours 02/25/20 2104 02/27/20 1255   02/25/20 2200  vancomycin (VANCOREADY) IVPB 1750 mg/350 mL        1,750 mg 175 mL/hr over 120 Minutes Intravenous  Once 02/25/20 2104 02/26/20 0049   02/25/20 1200  cefTRIAXone (ROCEPHIN) 2 g in sodium chloride 0.9 % 100 mL IVPB  Status:  Discontinued        2 g 200 mL/hr over 30 Minutes Intravenous Every 24 hours 02/25/20 0907 02/26/20 1150   02/25/20 1000  doxycycline (VIBRAMYCIN) 10 mg in dextrose 5 % 250 mL IVPB  Status:  Discontinued        10 mg 125 mL/hr over 120 Minutes Intravenous Every 12 hours 02/25/20 0039 02/25/20 0907   02/25/20 1000  doxycycline (VIBRAMYCIN) 100 mg in sodium chloride 0.9 % 250 mL IVPB  Status:  Discontinued        100 mg 125 mL/hr over 120 Minutes Intravenous Every 12 hours 02/25/20 0907 02/26/20 1148   02/25/20 0015  cefTRIAXone (ROCEPHIN) 1 g in sodium chloride 0.9 % 100 mL IVPB  Status:  Discontinued        1 g 200 mL/hr over 30 Minutes Intravenous  Once 02/25/20 0009 02/25/20 1140   02/24/20 2230  vancomycin (VANCOCIN) IVPB 1000 mg/200 mL premix  Status:  Discontinued        1,000  mg 200 mL/hr over 60 Minutes Intravenous  Once 02/24/20 2219 02/24/20 2220   02/24/20 2100  cefTRIAXone (ROCEPHIN) 1 g in sodium chloride 0.9 % 100 mL IVPB        1 g 200 mL/hr over 30 Minutes Intravenous  Once 02/24/20 2058 02/24/20 2318   02/24/20 2100  doxycycline (VIBRAMYCIN) 100 mg in sodium chloride 0.9 % 250 mL IVPB        100 mg 125 mL/hr over 120 Minutes Intravenous  Once 02/24/20 2058 02/25/20 0124     Subjective: Today, patient was seen and examined at bedside.  Was somnolent after IV Ativan.  Has been having episodes of agitation and confusion.  Nursing staff reported  that patient had paranoid ideation  Objective: Vitals:   02/29/20 0553 02/29/20 0937  BP: (!) 134/56 128/61  Pulse: (!) 58 65  Resp: 18 (!) 24  Temp: 99 F (37.2 C) 98.2 F (36.8 C)  SpO2: 99% 99%    Intake/Output Summary (Last 24 hours) at 02/29/2020 1326 Last data filed at 02/29/2020 0900 Gross per 24 hour  Intake 329.7 ml  Output 225 ml  Net 104.7 ml   Filed Weights   02/26/20 0600 02/27/20 1134 02/28/20 2034  Weight: 80.6 kg 82.1 kg 80.7 kg   Body mass index is 31.53 kg/m.   Physical Exam: GENERAL: Patient is somnolent on restraints , on nasal cannula oxygen  HENT: No scleral pallor or icterus. Pupils equally reactive to light. Oral mucosa is moist NECK: is supple, no gross swelling noted. CHEST:   Diminished breath sounds bilaterally. CVS: S1 and S2 heard, no murmur. Regular rate and rhythm.  ABDOMEN: Soft, non-tender, bowel sounds are present. EXTREMITIES: No edema.  On soft wrist restraints. CNS: Somnolent, intermittently agitated, SKIN: warm and dry without rashes.  Data Review: I have personally reviewed the following laboratory data and studies,  CBC: Recent Labs  Lab 02/24/20 2206 02/24/20 2330 02/25/20 0240 02/27/20 0336 02/29/20 1140  WBC  --  38.7* 40.8* 13.6* 6.7  NEUTROABS  --  33.2*  --   --   --   HGB 11.6* 9.3* 10.1* 8.1* 8.5*  HCT 34.0* 30.3* 32.1* 26.3* 27.7*   MCV  --  90.7 88.2 89.8 90.2  PLT  --  355 363 217 243   Basic Metabolic Panel: Recent Labs  Lab 02/25/20 0240 02/26/20 0402 02/27/20 0336 02/28/20 0319 02/29/20 1140  NA 134* 139 138 142 144  K 3.3* 3.3* 3.9 3.8 3.4*  CL 102 106 109 109 112*  CO2 19* 23 21* 23 23  GLUCOSE 123* 163* 110* 89 90  BUN 16 17 14 14 17   CREATININE 1.96* 1.52* 0.89 0.87 0.79  CALCIUM 7.9* 8.3* 8.3* 8.8* 8.7*  MG  --   --  1.7  --  2.2   Liver Function Tests: Recent Labs  Lab 02/24/20 2219 02/29/20 1140  AST 18 16  ALT 13 16  ALKPHOS 86 70  BILITOT 0.5 0.5  PROT 5.1* 4.9*  ALBUMIN 2.5* 2.5*   No results for input(s): LIPASE, AMYLASE in the last 168 hours. No results for input(s): AMMONIA in the last 168 hours. Cardiac Enzymes: No results for input(s): CKTOTAL, CKMB, CKMBINDEX, TROPONINI in the last 168 hours. BNP (last 3 results) Recent Labs    02/24/20 2330  BNP 201.2*    ProBNP (last 3 results) No results for input(s): PROBNP in the last 8760 hours.  CBG: Recent Labs  Lab 02/26/20 1622 02/27/20 1932  GLUCAP 130* 92   Recent Results (from the past 240 hour(s))  Blood Culture (routine x 2)     Status: Abnormal   Collection Time: 02/24/20 12:21 AM   Specimen: BLOOD RIGHT ARM  Result Value Ref Range Status   Specimen Description BLOOD RIGHT ARM  Final   Special Requests   Final    BOTTLES DRAWN AEROBIC AND ANAEROBIC Blood Culture adequate volume   Culture  Setup Time   Final    GRAM POSITIVE COCCI IN CLUSTERS AEROBIC BOTTLE ONLY Organism ID to follow CRITICAL RESULT CALLED TO, READ BACK BY AND VERIFIED WITH: F,WILSON PHARMD @2009  02/25/20 EB    Culture (A)  Final    STAPHYLOCOCCUS SPECIES (COAGULASE  NEGATIVE) THE SIGNIFICANCE OF ISOLATING THIS ORGANISM FROM A SINGLE SET OF BLOOD CULTURES WHEN MULTIPLE SETS ARE DRAWN IS UNCERTAIN. PLEASE NOTIFY THE MICROBIOLOGY DEPARTMENT WITHIN ONE WEEK IF SPECIATION AND SENSITIVITIES ARE REQUIRED. Performed at [facility],  [address], [place], [place] [address]    Report Status 02/26/2020 FINAL  Final  Blood Culture ID Panel (Reflexed)     Status: Abnormal   Collection Time: 02/24/20 12:21 AM  Result Value Ref Range Status   Enterococcus faecalis NOT DETECTED NOT DETECTED Final   Enterococcus Faecium NOT DETECTED NOT DETECTED Final   Listeria monocytogenes NOT DETECTED NOT DETECTED Final   Staphylococcus species DETECTED (A) NOT DETECTED Final    Comment: RESULT CALLED TO, READ BACK BY AND VERIFIED WITH: F,WILSON PHARMD @2009  02/25/20 EB    Staphylococcus aureus (BCID) NOT DETECTED NOT DETECTED Final   Staphylococcus epidermidis NOT DETECTED NOT DETECTED Final   Staphylococcus lugdunensis NOT DETECTED NOT DETECTED Final   Streptococcus species NOT DETECTED NOT DETECTED Final   Streptococcus agalactiae NOT DETECTED NOT DETECTED Final   Streptococcus pneumoniae NOT DETECTED NOT DETECTED Final   Streptococcus pyogenes NOT DETECTED NOT DETECTED Final   A.calcoaceticus-baumannii NOT DETECTED NOT DETECTED Final   Bacteroides fragilis NOT DETECTED NOT DETECTED Final   Enterobacterales NOT DETECTED NOT DETECTED Final   Enterobacter cloacae complex NOT DETECTED NOT DETECTED Final   Escherichia coli NOT DETECTED NOT DETECTED Final   Klebsiella aerogenes NOT DETECTED NOT DETECTED Final   Klebsiella oxytoca NOT DETECTED NOT DETECTED Final   Klebsiella pneumoniae NOT DETECTED NOT DETECTED Final   Proteus species NOT DETECTED NOT DETECTED Final   Salmonella species NOT DETECTED NOT DETECTED Final   Serratia marcescens NOT DETECTED NOT DETECTED Final   Haemophilus influenzae NOT DETECTED NOT DETECTED Final   Neisseria meningitidis NOT DETECTED NOT DETECTED Final   Pseudomonas aeruginosa NOT DETECTED NOT DETECTED Final   Stenotrophomonas maltophilia NOT DETECTED NOT DETECTED Final   Candida albicans NOT DETECTED NOT DETECTED Final   Candida auris NOT DETECTED NOT DETECTED Final   Candida glabrata NOT DETECTED NOT  DETECTED Final   Candida krusei NOT DETECTED NOT DETECTED Final   Candida parapsilosis NOT DETECTED NOT DETECTED Final   Candida tropicalis NOT DETECTED NOT DETECTED Final   Cryptococcus neoformans/gattii NOT DETECTED NOT DETECTED Final    Comment: Performed at [facility], [address] [place], [place]  SARS Coronavirus 2 by RT PCR (hospital order, performed in [facility] hospital lab) Nasopharyngeal Nasopharyngeal Swab     Status: None   Collection Time: 02/24/20  8:23 PM   Specimen: Nasopharyngeal Swab  Result Value Ref Range Status   SARS Coronavirus 2 NEGATIVE NEGATIVE Final    Comment: (NOTE) SARS-CoV-2 target nucleic acids are NOT DETECTED.  The SARS-CoV-2 RNA is generally detectable in upper and lower respiratory specimens during the acute phase of infection. The lowest concentration of SARS-CoV-2 viral copies this assay can detect is 250 copies / mL. A negative result does not preclude SARS-CoV-2 infection and should not be used as the sole basis for treatment or other patient management decisions.  A negative result may occur with improper specimen collection / handling, submission of specimen other than nasopharyngeal swab, presence of viral mutation(s) within the areas targeted by this assay, and inadequate number of viral copies (<250 copies / mL). A negative result must be combined with clinical observations, patient history, and epidemiological information.  Fact Sheet for Patients:  BoilerBrush.com.cy  Fact Sheet for Healthcare Providers: https://pope.com/  This test is not yet approved or  cleared by the [place] FDA and has been authorized for detection and/or diagnosis of SARS-CoV-2 by FDA under an Emergency Use Authorization (EUA).  This EUA will remain in effect (meaning this test can be used) for the duration of the COVID-19 declaration under Section 564(b)(1) of the Act, 21  U.S.C. section 360bbb-3(b)(1), unless the authorization is terminated or revoked sooner.  Performed at [facility], [address] [place], [place] [address]   Blood Culture (routine x 2)     Status: None (Preliminary result)   Collection Time: 02/24/20 10:27 PM   Specimen: BLOOD  Result Value Ref Range Status   Specimen Description   Final    BLOOD LEFT ANTECUBITAL Performed at [facility], [address] [place], [place] [address]    Special Requests   Final    BOTTLES DRAWN AEROBIC AND ANAEROBIC Blood Culture adequate volume Performed at [facility], [address] [place], [place] [address]    Culture   Final    NO GROWTH 4 DAYS Performed at [facility], [address] [place], [place] [address]    Report Status PENDING  Incomplete  Expectorated sputum assessment w rflx to resp cult     Status: None   Collection Time: 02/25/20  1:30 AM   Specimen: Expectorated Sputum  Result Value Ref Range Status   Specimen Description EXPECTORATED SPUTUM  Final   Special Requests NONE  Final   Sputum evaluation   Final    THIS SPECIMEN IS ACCEPTABLE FOR SPUTUM CULTURE Performed at [facility], [address], [place], [place] [address]    Report Status 02/25/2020 FINAL  Final  Culture, respiratory     Status: None   Collection Time: 02/25/20  1:30 AM  Result Value Ref Range Status   Specimen Description EXPECTORATED SPUTUM  Final   Special Requests NONE Reflexed from [id number]  Final   Gram Stain   Final    ABUNDANT WBC PRESENT, PREDOMINANTLY PMN MODERATE GRAM POSITIVE COCCI FEW GRAM NEGATIVE RODS Performed at [facility], [address] [place], [place] [address]    Culture MODERATE KLEBSIELLA PNEUMONIAE  Final   Report Status 02/27/2020 FINAL  Final   Organism ID, Bacteria KLEBSIELLA PNEUMONIAE  Final      Susceptibility   Klebsiella pneumoniae - MIC*    AMPICILLIN RESISTANT Resistant     CEFAZOLIN <=4 SENSITIVE Sensitive     CEFEPIME  <=0.12 SENSITIVE Sensitive     CEFTAZIDIME <=1 SENSITIVE Sensitive     CEFTRIAXONE <=0.25 SENSITIVE Sensitive     CIPROFLOXACIN <=0.25 SENSITIVE Sensitive     GENTAMICIN <=1 SENSITIVE Sensitive     IMIPENEM <=0.25 SENSITIVE Sensitive     TRIMETH/SULFA <=20 SENSITIVE Sensitive     AMPICILLIN/SULBACTAM 4 SENSITIVE Sensitive     PIP/TAZO <=4 SENSITIVE Sensitive     * MODERATE KLEBSIELLA PNEUMONIAE  MRSA PCR Screening     Status: Abnormal   Collection Time: 02/25/20  2:21 AM   Specimen: Nasopharyngeal  Result Value Ref Range Status   MRSA by PCR POSITIVE (A) NEGATIVE Final    Comment:        The GeneXpert MRSA Assay (FDA approved for NASAL specimens only), is one component of a comprehensive MRSA colonization surveillance program. It is not intended to diagnose MRSA infection nor to guide or monitor treatment for MRSA  infections. RESULT CALLED TO, READ BACK BY AND VERIFIED WITH: Baldo Ash 02/25/20 0500 JDW Performed at [facility], [address] [place], [place] [address]   Respiratory Panel by PCR     Status: None   Collection Time: 02/26/20  3:02 PM   Specimen: Nasopharyngeal Swab; Respiratory  Result Value Ref Range Status   Adenovirus NOT DETECTED NOT DETECTED Final   Coronavirus 229E NOT DETECTED NOT DETECTED Final    Comment: (NOTE) The Coronavirus on the Respiratory Panel, DOES NOT test for the novel  Coronavirus (2019 nCoV)    Coronavirus HKU1 NOT DETECTED NOT DETECTED Final   Coronavirus NL63 NOT DETECTED NOT DETECTED Final   Coronavirus OC43 NOT DETECTED NOT DETECTED Final   Metapneumovirus NOT DETECTED NOT DETECTED Final   Rhinovirus / Enterovirus NOT DETECTED NOT DETECTED Final   Influenza A NOT DETECTED NOT DETECTED Final   Influenza B NOT DETECTED NOT DETECTED Final   Parainfluenza Virus 1 NOT DETECTED NOT DETECTED Final   Parainfluenza Virus 2 NOT DETECTED NOT DETECTED Final   Parainfluenza Virus 3 NOT DETECTED NOT DETECTED Final   Parainfluenza  Virus 4 NOT DETECTED NOT DETECTED Final   Respiratory Syncytial Virus NOT DETECTED NOT DETECTED Final   Bordetella pertussis NOT DETECTED NOT DETECTED Final   Chlamydophila pneumoniae NOT DETECTED NOT DETECTED Final   Mycoplasma pneumoniae NOT DETECTED NOT DETECTED Final    Comment: Performed at [facility], [address] [place], [place] [address]  Culture, blood (routine x 2)     Status: None (Preliminary result)   Collection Time: 02/27/20  5:09 AM   Specimen: BLOOD  Result Value Ref Range Status   Specimen Description BLOOD RIGHT ARM  Final   Special Requests   Final    BOTTLES DRAWN AEROBIC ONLY Blood Culture adequate volume   Culture   Final    NO GROWTH 2 DAYS Performed at [facility], [address], [place], [place] [address]    Report Status PENDING  Incomplete  Culture, blood (routine x 2)     Status: None (Preliminary result)   Collection Time: 02/27/20  5:15 AM   Specimen: BLOOD  Result Value Ref Range Status   Specimen Description BLOOD LEFT ARM  Final   Special Requests   Final    BOTTLES DRAWN AEROBIC AND ANAEROBIC Blood Culture adequate volume   Culture   Final    NO GROWTH 2 DAYS Performed at [facility], [address], [place], [place] [address]    Report Status PENDING  Incomplete     Studies: ECHOCARDIOGRAM COMPLETE  Result Date: 02/27/2020    ECHOCARDIOGRAM REPORT   Patient Name:   Erin Lynch Date of Exam: 02/27/2020 Medical Rec #:  [id number]   Height:       63.0 in Accession #:    [id number]  Weight:       181.0 lb Date of Birth:  1960-07-24    BSA:          1.853 m Patient Age:    59 years    BP:           152/88 mmHg Patient Gender: F           HR:           62 bpm. Exam Location:  Inpatient Procedure: 2D Echo Indications:    bacteremia  History:        Patient has prior history of Echocardiogram  examinations, most                 recent 02/01/2019. CHF, CAD; Risk Factors:Hypertension,                 Dyslipidemia and Current Smoker.   Sonographer:    Celene Skeen RDCS (AE) Referring Phys: [id number] CHI JANE ELLISON  Sonographer Comments: Image acquisition challenging due to respiratory motion and Image acquisition challenging due to patient body habitus. IMPRESSIONS  1. Left ventricular ejection fraction, by estimation, is 55 to 60%. The left ventricle has normal function. The left ventricle has no regional wall motion abnormalities. The left ventricular internal cavity size was mildly dilated. Left ventricular diastolic parameters were normal.  2. Right ventricular systolic function is normal. The right ventricular size is normal. There is moderately elevated pulmonary artery systolic pressure.  3. The mitral valve is normal in structure. Trivial mitral valve regurgitation. No evidence of mitral stenosis.  4. Tricuspid valve regurgitation is mild to moderate.  5. The aortic valve has an indeterminant number of cusps. Aortic valve regurgitation is mild to moderate.  6. The inferior vena cava is dilated in size with <50% respiratory variability, suggesting right atrial pressure of 15 mmHg. Conclusion(s)/Recommendation(s): No evidence of valvular vegetations on this transthoracic echocardiogram. Would recommend a transesophageal echocardiogram to exclude infective endocarditis if clinically indicated. FINDINGS  Left Ventricle: Left ventricular ejection fraction, by estimation, is 55 to 60%. The left ventricle has normal function. The left ventricle has no regional wall motion abnormalities. The left ventricular internal cavity size was mildly dilated. There is  borderline left ventricular hypertrophy. Left ventricular diastolic parameters were normal. Right Ventricle: The right ventricular size is normal. No increase in right ventricular wall thickness. Right ventricular systolic function is normal. There is moderately elevated pulmonary artery systolic pressure. The tricuspid regurgitant velocity is 2.81 m/s, and with an assumed right atrial  pressure of 15 mmHg, the estimated right ventricular systolic pressure is 46.6 mmHg. Left Atrium: Left atrial size was normal in size. Right Atrium: Right atrial size was normal in size. Pericardium: There is no evidence of pericardial effusion. Mitral Valve: The mitral valve is normal in structure. Trivial mitral valve regurgitation. No evidence of mitral valve stenosis. Tricuspid Valve: The tricuspid valve is normal in structure. Tricuspid valve regurgitation is mild to moderate. Aortic Valve: The aortic valve has an indeterminant number of cusps. . There is mild thickening and mild calcification of the aortic valve. Aortic valve regurgitation is mild to moderate. There is mild thickening of the aortic valve. There is mild calcification of the aortic valve. Pulmonic Valve: The pulmonic valve was not well visualized. Pulmonic valve regurgitation is not visualized. Aorta: The ascending aorta was not well visualized and the aortic arch was not well visualized. Venous: The inferior vena cava is dilated in size with less than 50% respiratory variability, suggesting right atrial pressure of 15 mmHg. IAS/Shunts: No atrial level shunt detected by color flow Doppler.  LEFT VENTRICLE PLAX 2D LVIDd:         5.33 cm  Diastology LVIDs:         3.87 cm  LV e' lateral:   9.14 cm/s LV PW:         1.10 cm  LV E/e' lateral: 12.1 LV IVS:        1.10 cm  LV e' medial:    8.38 cm/s LVOT diam:     1.80 cm  LV E/e' medial:  13.2 LV SV:  67 LV SV Index:   36 LVOT Area:     2.54 cm  RIGHT VENTRICLE RV S prime:     9.36 cm/s TAPSE (M-mode): 2.6 cm LEFT ATRIUM           Index       RIGHT ATRIUM           Index LA diam:      3.60 cm 1.94 cm/m  RA Area:     13.70 cm LA Vol (A2C): 47.0 ml 25.36 ml/m RA Volume:   35.60 ml  19.21 ml/m LA Vol (A4C): 26.2 ml 14.14 ml/m  AORTIC VALVE LVOT Vmax:   109.00 cm/s LVOT Vmean:  78.000 cm/s LVOT VTI:    0.265 m  AORTA Ao Root diam: 3.10 cm MITRAL VALVE                TRICUSPID VALVE MV Area  (PHT): 4.80 cm     TR Peak grad:   31.6 mmHg MV Decel Time: 158 msec     TR Vmax:        281.00 cm/s MV E velocity: 111.00 cm/s MV A velocity: 100.00 cm/s  SHUNTS MV E/A ratio:  1.11         Systemic VTI:  0.26 m                             Systemic Diam: 1.80 cm Jodelle Red MD Electronically signed by Jodelle Red MD Signature Date/Time: 02/27/2020/3:54:13 PM    Final       Joycelyn Das, MD  [facility] 02/29/2020

## 2020-02-29 NOTE — Progress Notes (Signed)
Patient remains confused and restless. Alert to self only. On and off hollering with occasional profanity. Kept dry and comfortable. Took all medications this evening. Incontinent of bladder.

## 2020-02-29 NOTE — Progress Notes (Signed)
Patient woke up at this time and started yelling out ,cursing out on top of her lungs.R.N. tried to calm her down but she become verbally abusive with expletive words.Rn tried to distract her and offered a breakfast for her but the patient refused.When offered with soda ,patient took it with her medicines.

## 2020-03-01 LAB — BASIC METABOLIC PANEL
Anion gap: 5 (ref 5–15)
BUN: 15 mg/dL (ref 8–23)
CO2: 24 mmol/L (ref 22–32)
Calcium: 8 mg/dL — ABNORMAL LOW (ref 8.9–10.3)
Chloride: 113 mmol/L — ABNORMAL HIGH (ref 98–111)
Creatinine, Ser: 0.76 mg/dL (ref 0.44–1.00)
GFR calc Af Amer: >60 mL/min (ref >60)
GFR calc non Af Amer: >60 mL/min (ref >60)
Glucose, Bld: 97 mg/dL (ref 70–99)
Potassium: 3.4 mmol/L — ABNORMAL LOW (ref 3.5–5.1)
Sodium: 142 mmol/L (ref 135–145)

## 2020-03-01 LAB — CBC
HCT: 26.7 % — ABNORMAL LOW (ref 36.0–46.0)
Hemoglobin: 8 g/dL — ABNORMAL LOW (ref 12.0–15.0)
MCH: 27.4 pg (ref 26.0–34.0)
MCHC: 30 g/dL (ref 30.0–36.0)
MCV: 91.4 fL (ref 80.0–100.0)
Platelets: 245 10*3/uL (ref 150–400)
RBC: 2.92 MIL/uL — ABNORMAL LOW (ref 3.87–5.11)
RDW: 14.8 % (ref 11.5–15.5)
WBC: 6 10*3/uL (ref 4.0–10.5)
nRBC: 0 % (ref 0.0–0.2)

## 2020-03-01 LAB — CULTURE, BLOOD (ROUTINE X 2)
Culture: NO GROWTH
Special Requests: ADEQUATE

## 2020-03-01 MED ORDER — ACETAMINOPHEN 325 MG PO TABS
650.0000 mg | ORAL_TABLET | Freq: Four times a day (QID) | ORAL | Status: DC | PRN
  Administered 2020-03-02 (×2): 650 mg via ORAL
  Filled 2020-03-01 (×3): qty 2

## 2020-03-01 MED ORDER — HYDROCORTISONE NA SUCCINATE PF 100 MG IJ SOLR
25.0000 mg | Freq: Every day | INTRAMUSCULAR | Status: AC
  Administered 2020-03-01: 25 mg via INTRAVENOUS
  Filled 2020-03-01: qty 2

## 2020-03-01 MED ORDER — PHENOL 1.4 % MT LIQD
1.0000 | OROMUCOSAL | Status: DC | PRN
  Administered 2020-03-01: 1 via OROMUCOSAL
  Filled 2020-03-01: qty 177

## 2020-03-01 NOTE — Progress Notes (Signed)
Attempted to call psych back multiple times with no answer when patient and iPad was available. Will have iPad available if psych calls.

## 2020-03-01 NOTE — Consult Note (Signed)
This writer attempted x 3 to assess this patient via telepsychiatry. Writer spoke to [place] at 10:43am informed that ipad was needed to assess this patient. Writer called back at 11:16am and staff RN Bari Edward advised that physical therapy was working with the patient, would call back. No called received, called back at 1:26pm and advised nurse was off the floor to lunch. WIll attempt to reassess patient tomorrow.

## 2020-03-01 NOTE — Progress Notes (Signed)
PROGRESS NOTE  Erin Lynch WUJ:[id number] DOB: 02-Nov-1960 DOA: 02/24/2020 PCP: Galvin Proffer, MD   LOS: 5 days   Brief narrative: As per HPI,  Erin Lynch is a 59 years old female with past medical history of COPD and tobacco abuse presented to hospital with complaints of not feeling well and fall.  EMS was called in and was noted to be febrile with shortness of breath.  Patient takes inhalers at home but was not on oxygen.  In the ED, she was noted to be hypoxic and hypotensive.  She was noted to have pneumonia.  Patient required central venous catheter and vasopressors, IV antibiotics and was admitted to the ICU.  Subsequently, patient was considered stable for transfer out of the ICU.   Assessment/Plan:  Septic shock secondary to pneumonia.   Acute hypoxic respiratory failure -Patient was admitted to the ICU with septic shock, treated with fluids, vasopressors and broad-spectrum antibiotics  -Sputum cultures grew Klebsiella, currently on IV ceftriaxone  -Blood cultures with coag negative staph in 1 set consistent with contamination  -Wean off O2  -Taper off IV steroids -Ambulate, out of bed  Agitation confusion, restlessness  -Likely due to ICU delirium and steroid psychosis  -Patient also has an underlying history of anxiety/depression and bipolar disorder  -Agitation is improving, taper off steroids  -Resume home regimen of Xanax, Latuda and Cymbalta   History of anxiety, bipolar disorder with psychotic features.    -Resumed home regimen of Xanax/Cymbalta and Latuda, see discussion above   COPD, ongoing tobacco abuse, with acute exacerbation - Continue LABA/ICS/LAMA inpatient. Resume home Trelegy at discharge -Wean off steroids, not actively wheezing at this time -Smoking cessation counseled, nicotine patch  Acute kidney injury.   -On presentation.  Has resolved at this time.  Creatinine of 0.7.  Hypovolemic hyponatremia, -Resolved   Hypokalemia.    -Replaced  Chronic normocytic anemia -Stable, no active bleeding, recommend outpatient work-up for this  History of essential HTN, HLD, CAD. Chronic HFpEF 2D echocardiogram with preserved LV function.   -Resumed metoprolol and lisinopril     Hypothyroidism Continue synthroid  Restless leg syndrome.  Resumed gabapentin from home.  DVT prophylaxis: heparin injection 5,000 Units Start: 02/25/20 0600 SCDs Start: 02/25/20 0031   Code Status:  Full code  Family Communication: No family at bedside, discussed with patient in detail  Status is: Inpatient  Remains inpatient appropriate because:Unsafe d/c plan, IV treatments appropriate due to intensity of illness or inability to take PO and Inpatient level of care appropriate due to severity of illness and agitation   Dispo: The patient is from: Home              Anticipated d/c is to: Home with home health versus CIR/SNF              Anticipated d/c date is: 1 to 2 days              Patient currently is not medically stable to d/c.  Consultants:  PCCM  Psychiatry  Procedures:  None  Antibiotics:  . Rocephin IV  Anti-infectives (From admission, onward)   Start     Dose/Rate Route Frequency Ordered Stop   02/27/20 1300  cefTRIAXone (ROCEPHIN) 2 g in sodium chloride 0.9 % 100 mL IVPB     Discontinue     2 g 200 mL/hr over 30 Minutes Intravenous Every 24 hours 02/27/20 1255 03/05/20 1259   02/26/20 2200  cefTRIAXone (ROCEPHIN) 2 g in sodium chloride 0.9 %  100 mL IVPB  Status:  Discontinued        2 g 200 mL/hr over 30 Minutes Intravenous Every 24 hours 02/25/20 0001 02/25/20 0907   02/26/20 2200  vancomycin (VANCOCIN) IVPB 1000 mg/200 mL premix  Status:  Discontinued        1,000 mg 200 mL/hr over 60 Minutes Intravenous Every 24 hours 02/25/20 2104 02/27/20 1255   02/25/20 2200  vancomycin (VANCOREADY) IVPB 1750 mg/350 mL        1,750 mg 175 mL/hr over 120 Minutes Intravenous  Once 02/25/20 2104 02/26/20 0049    02/25/20 1200  cefTRIAXone (ROCEPHIN) 2 g in sodium chloride 0.9 % 100 mL IVPB  Status:  Discontinued        2 g 200 mL/hr over 30 Minutes Intravenous Every 24 hours 02/25/20 0907 02/26/20 1150   02/25/20 1000  doxycycline (VIBRAMYCIN) 10 mg in dextrose 5 % 250 mL IVPB  Status:  Discontinued        10 mg 125 mL/hr over 120 Minutes Intravenous Every 12 hours 02/25/20 0039 02/25/20 0907   02/25/20 1000  doxycycline (VIBRAMYCIN) 100 mg in sodium chloride 0.9 % 250 mL IVPB  Status:  Discontinued        100 mg 125 mL/hr over 120 Minutes Intravenous Every 12 hours 02/25/20 0907 02/26/20 1148   02/25/20 0015  cefTRIAXone (ROCEPHIN) 1 g in sodium chloride 0.9 % 100 mL IVPB  Status:  Discontinued        1 g 200 mL/hr over 30 Minutes Intravenous  Once 02/25/20 0009 02/25/20 1140   02/24/20 2230  vancomycin (VANCOCIN) IVPB 1000 mg/200 mL premix  Status:  Discontinued        1,000 mg 200 mL/hr over 60 Minutes Intravenous  Once 02/24/20 2219 02/24/20 2220   02/24/20 2100  cefTRIAXone (ROCEPHIN) 1 g in sodium chloride 0.9 % 100 mL IVPB        1 g 200 mL/hr over 30 Minutes Intravenous  Once 02/24/20 2058 02/24/20 2318   02/24/20 2100  doxycycline (VIBRAMYCIN) 100 mg in sodium chloride 0.9 % 250 mL IVPB        100 mg 125 mL/hr over 120 Minutes Intravenous  Once 02/24/20 2058 02/25/20 0124     Subjective: -Extreme agitation during the day yesterday, slightly better last night  Objective: Vitals:   03/01/20 0840 03/01/20 0915  BP:  117/70  Pulse:  71  Resp:  20  Temp:  98.1 F (36.7 C)  SpO2: 100% 96%    Intake/Output Summary (Last 24 hours) at 03/01/2020 1149 Last data filed at 03/01/2020 0830 Gross per 24 hour  Intake 1080 ml  Output 300 ml  Net 780 ml   Filed Weights   02/27/20 1134 02/28/20 2034 03/01/20 0535  Weight: 82.1 kg 80.7 kg 78.5 kg   Body mass index is 30.65 kg/m.   Physical Exam: Gen: Averagely built female sitting up in bed, awake alert oriented to self and place, only  partly to time HEENT: No JVD CVS: S1-S2, regular rate rhythm Lungs: Poor air movement bilaterally, few basilar rales, no wheezes Abdomen: Soft, obese, nontender, bowel sounds present Extremities: No edema SKIN: No rashes on exposed skin  Data Review: I have personally reviewed the following laboratory data and studies,  CBC: Recent Labs  Lab 02/24/20 2330 02/25/20 0240 02/27/20 0336 02/29/20 1140 03/01/20 0414  WBC 38.7* 40.8* 13.6* 6.7 6.0  NEUTROABS 33.2*  --   --   --   --  HGB 9.3* 10.1* 8.1* 8.5* 8.0*  HCT 30.3* 32.1* 26.3* 27.7* 26.7*  MCV 90.7 88.2 89.8 90.2 91.4  PLT 355 363 217 243 245   Basic Metabolic Panel: Recent Labs  Lab 02/26/20 0402 02/27/20 0336 02/28/20 0319 02/29/20 1140 03/01/20 0414  NA 139 138 142 144 142  K 3.3* 3.9 3.8 3.4* 3.4*  CL 106 109 109 112* 113*  CO2 23 21* 23 23 24   GLUCOSE 163* 110* 89 90 97  BUN 17 14 14 17 15   CREATININE 1.52* 0.89 0.87 0.79 0.76  CALCIUM 8.3* 8.3* 8.8* 8.7* 8.0*  MG  --  1.7  --  2.2  --    Liver Function Tests: Recent Labs  Lab 02/24/20 2219 02/29/20 1140  AST 18 16  ALT 13 16  ALKPHOS 86 70  BILITOT 0.5 0.5  PROT 5.1* 4.9*  ALBUMIN 2.5* 2.5*   No results for input(s): LIPASE, AMYLASE in the last 168 hours. No results for input(s): AMMONIA in the last 168 hours. Cardiac Enzymes: No results for input(s): CKTOTAL, CKMB, CKMBINDEX, TROPONINI in the last 168 hours. BNP (last 3 results) Recent Labs    02/24/20 2330  BNP 201.2*    ProBNP (last 3 results) No results for input(s): PROBNP in the last 8760 hours.  CBG: Recent Labs  Lab 02/26/20 1622 02/27/20 1932  GLUCAP 130* 92   Recent Results (from the past 240 hour(s))  Blood Culture (routine x 2)     Status: Abnormal   Collection Time: 02/24/20 12:21 AM   Specimen: BLOOD RIGHT ARM  Result Value Ref Range Status   Specimen Description BLOOD RIGHT ARM  Final   Special Requests   Final    BOTTLES DRAWN AEROBIC AND ANAEROBIC Blood  Culture adequate volume   Culture  Setup Time   Final    GRAM POSITIVE COCCI IN CLUSTERS AEROBIC BOTTLE ONLY Organism ID to follow CRITICAL RESULT CALLED TO, READ BACK BY AND VERIFIED WITH: F,WILSON PHARMD @2009  02/25/20 EB    Culture (A)  Final    STAPHYLOCOCCUS SPECIES (COAGULASE NEGATIVE) THE SIGNIFICANCE OF ISOLATING THIS ORGANISM FROM A SINGLE SET OF BLOOD CULTURES WHEN MULTIPLE SETS ARE DRAWN IS UNCERTAIN. PLEASE NOTIFY THE MICROBIOLOGY DEPARTMENT WITHIN ONE WEEK IF SPECIATION AND SENSITIVITIES ARE REQUIRED. Performed at [facility], [address] [place], [facility] [address]    Report Status 02/26/2020 FINAL  Final  Blood Culture ID Panel (Reflexed)     Status: Abnormal   Collection Time: 02/24/20 12:21 AM  Result Value Ref Range Status   Enterococcus faecalis NOT DETECTED NOT DETECTED Final   Enterococcus Faecium NOT DETECTED NOT DETECTED Final   Listeria monocytogenes NOT DETECTED NOT DETECTED Final   Staphylococcus species DETECTED (A) NOT DETECTED Final    Comment: RESULT CALLED TO, READ BACK BY AND VERIFIED WITH: F,WILSON PHARMD @2009  02/25/20 EB    Staphylococcus aureus (BCID) NOT DETECTED NOT DETECTED Final   Staphylococcus epidermidis NOT DETECTED NOT DETECTED Final   Staphylococcus lugdunensis NOT DETECTED NOT DETECTED Final   Streptococcus species NOT DETECTED NOT DETECTED Final   Streptococcus agalactiae NOT DETECTED NOT DETECTED Final   Streptococcus pneumoniae NOT DETECTED NOT DETECTED Final   Streptococcus pyogenes NOT DETECTED NOT DETECTED Final   A.calcoaceticus-baumannii NOT DETECTED NOT DETECTED Final   Bacteroides fragilis NOT DETECTED NOT DETECTED Final   Enterobacterales NOT DETECTED NOT DETECTED Final   Enterobacter cloacae complex NOT DETECTED NOT DETECTED Final   Escherichia coli NOT DETECTED NOT DETECTED  Final   Klebsiella aerogenes NOT DETECTED NOT DETECTED Final   Klebsiella oxytoca NOT DETECTED NOT DETECTED Final   Klebsiella pneumoniae NOT  DETECTED NOT DETECTED Final   Proteus species NOT DETECTED NOT DETECTED Final   Salmonella species NOT DETECTED NOT DETECTED Final   Serratia marcescens NOT DETECTED NOT DETECTED Final   Haemophilus influenzae NOT DETECTED NOT DETECTED Final   Neisseria meningitidis NOT DETECTED NOT DETECTED Final   Pseudomonas aeruginosa NOT DETECTED NOT DETECTED Final   Stenotrophomonas maltophilia NOT DETECTED NOT DETECTED Final   Candida albicans NOT DETECTED NOT DETECTED Final   Candida auris NOT DETECTED NOT DETECTED Final   Candida glabrata NOT DETECTED NOT DETECTED Final   Candida krusei NOT DETECTED NOT DETECTED Final   Candida parapsilosis NOT DETECTED NOT DETECTED Final   Candida tropicalis NOT DETECTED NOT DETECTED Final   Cryptococcus neoformans/gattii NOT DETECTED NOT DETECTED Final    Comment: Performed at [facility], [address], [place], [place] [address]  SARS Coronavirus 2 by RT PCR (hospital order, performed in [facility] hospital lab) Nasopharyngeal Nasopharyngeal Swab     Status: None   Collection Time: 02/24/20  8:23 PM   Specimen: Nasopharyngeal Swab  Result Value Ref Range Status   SARS Coronavirus 2 NEGATIVE NEGATIVE Final    Comment: (NOTE) SARS-CoV-2 target nucleic acids are NOT DETECTED.  The SARS-CoV-2 RNA is generally detectable in upper and lower respiratory specimens during the acute phase of infection. The lowest concentration of SARS-CoV-2 viral copies this assay can detect is 250 copies / mL. A negative result does not preclude SARS-CoV-2 infection and should not be used as the sole basis for treatment or other patient management decisions.  A negative result may occur with improper specimen collection / handling, submission of specimen other than nasopharyngeal swab, presence of viral mutation(s) within the areas targeted by this assay, and inadequate number of viral copies (<250 copies / mL). A negative result must be combined with  clinical observations, patient history, and epidemiological information.  Fact Sheet for Patients:   BoilerBrush.com.cy  Fact Sheet for Healthcare Providers: https://pope.com/  This test is not yet approved or  cleared by the [place] FDA and has been authorized for detection and/or diagnosis of SARS-CoV-2 by FDA under an Emergency Use Authorization (EUA).  This EUA will remain in effect (meaning this test can be used) for the duration of the COVID-19 declaration under Section 564(b)(1) of the Act, 21 U.S.C. section 360bbb-3(b)(1), unless the authorization is terminated or revoked sooner.  Performed at [facility], [address], [place], [place] [address]   Blood Culture (routine x 2)     Status: None   Collection Time: 02/24/20 10:27 PM   Specimen: BLOOD  Result Value Ref Range Status   Specimen Description   Final    BLOOD LEFT ANTECUBITAL Performed at [facility], [address], [place], [place] [address]    Special Requests   Final    BOTTLES DRAWN AEROBIC AND ANAEROBIC Blood Culture adequate volume Performed at [facility], [address] [place], [place] [address]    Culture   Final    NO GROWTH 5 DAYS Performed at [facility], [address], [place], [place] [address]    Report Status 03/01/2020 FINAL  Final  Expectorated sputum assessment w rflx to resp cult     Status: None   Collection Time: 02/25/20  1:30 AM   Specimen: Expectorated Sputum  Result Value Ref Range Status   Specimen Description EXPECTORATED SPUTUM  Final   Special Requests NONE  Final   Sputum evaluation   Final    THIS SPECIMEN IS ACCEPTABLE FOR SPUTUM CULTURE Performed at [facility], [address], [place], [place] [address]    Report Status 02/25/2020 FINAL  Final  Culture, respiratory     Status: None   Collection Time: 02/25/20  1:30 AM  Result Value Ref Range Status   Specimen Description  EXPECTORATED SPUTUM  Final   Special Requests NONE Reflexed from [id number]  Final   Gram Stain   Final    ABUNDANT WBC PRESENT, PREDOMINANTLY PMN MODERATE GRAM POSITIVE COCCI FEW GRAM NEGATIVE RODS Performed at [facility], [address] [place], [place] [address]    Culture MODERATE KLEBSIELLA PNEUMONIAE  Final   Report Status 02/27/2020 FINAL  Final   Organism ID, Bacteria KLEBSIELLA PNEUMONIAE  Final      Susceptibility   Klebsiella pneumoniae - MIC*    AMPICILLIN RESISTANT Resistant     CEFAZOLIN <=4 SENSITIVE Sensitive     CEFEPIME <=0.12 SENSITIVE Sensitive     CEFTAZIDIME <=1 SENSITIVE Sensitive     CEFTRIAXONE <=0.25 SENSITIVE Sensitive     CIPROFLOXACIN <=0.25 SENSITIVE Sensitive     GENTAMICIN <=1 SENSITIVE Sensitive     IMIPENEM <=0.25 SENSITIVE Sensitive     TRIMETH/SULFA <=20 SENSITIVE Sensitive     AMPICILLIN/SULBACTAM 4 SENSITIVE Sensitive     PIP/TAZO <=4 SENSITIVE Sensitive     * MODERATE KLEBSIELLA PNEUMONIAE  MRSA PCR Screening     Status: Abnormal   Collection Time: 02/25/20  2:21 AM   Specimen: Nasopharyngeal  Result Value Ref Range Status   MRSA by PCR POSITIVE (A) NEGATIVE Final    Comment:        The GeneXpert MRSA Assay (FDA approved for NASAL specimens only), is one component of a comprehensive MRSA colonization surveillance program. It is not intended to diagnose MRSA infection nor to guide or monitor treatment for MRSA infections. RESULT CALLED TO, READ BACK BY AND VERIFIED WITH: Baldo Ash 02/25/20 0500 JDW Performed at [facility], [address] [place], [place] [address]   Respiratory Panel by PCR     Status: None   Collection Time: 02/26/20  3:02 PM   Specimen: Nasopharyngeal Swab; Respiratory  Result Value Ref Range Status   Adenovirus NOT DETECTED NOT DETECTED Final   Coronavirus 229E NOT DETECTED NOT DETECTED Final    Comment: (NOTE) The Coronavirus on the Respiratory Panel, DOES NOT test for the novel  Coronavirus  (2019 nCoV)    Coronavirus HKU1 NOT DETECTED NOT DETECTED Final   Coronavirus NL63 NOT DETECTED NOT DETECTED Final   Coronavirus OC43 NOT DETECTED NOT DETECTED Final   Metapneumovirus NOT DETECTED NOT DETECTED Final   Rhinovirus / Enterovirus NOT DETECTED NOT DETECTED Final   Influenza A NOT DETECTED NOT DETECTED Final   Influenza B NOT DETECTED NOT DETECTED Final   Parainfluenza Virus 1 NOT DETECTED NOT DETECTED Final   Parainfluenza Virus 2 NOT DETECTED NOT DETECTED Final   Parainfluenza Virus 3 NOT DETECTED NOT DETECTED Final   Parainfluenza Virus 4 NOT DETECTED NOT DETECTED Final   Respiratory Syncytial Virus NOT DETECTED NOT DETECTED Final   Bordetella pertussis NOT DETECTED NOT DETECTED Final   Chlamydophila pneumoniae NOT DETECTED NOT DETECTED Final   Mycoplasma pneumoniae NOT DETECTED NOT DETECTED Final    Comment: Performed at [facility] [place] [facility], [address] [place], [place] [address]  Culture, blood (routine x 2)     Status: None (Preliminary result)   Collection Time: 02/27/20  5:09 AM   Specimen: BLOOD  Result Value Ref Range Status   Specimen Description BLOOD RIGHT ARM  Final   Special Requests   Final    BOTTLES DRAWN AEROBIC ONLY Blood Culture adequate volume   Culture   Final    NO GROWTH 3 DAYS Performed at [facility], [address] [place], [place] [address]    Report Status PENDING  Incomplete  Culture, blood (routine x 2)     Status: None (Preliminary result)   Collection Time: 02/27/20  5:15 AM   Specimen: BLOOD  Result Value Ref Range Status   Specimen Description BLOOD LEFT ARM  Final   Special Requests   Final    BOTTLES DRAWN AEROBIC AND ANAEROBIC Blood Culture adequate volume   Culture   Final    NO GROWTH 3 DAYS Performed at [facility], [address] [place], [place] [address]    Report Status PENDING  Incomplete     Studies: No results found.   Zannie CovePreetha Alyssah Algeo, MD  [facility] 03/01/2020

## 2020-03-01 NOTE — Evaluation (Signed)
Occupational Therapy Evaluation Patient Details Name: Erin Lynch MRN: [id number] DOB: [birth year] Today's Date: 03/01/2020    History of Present Illness Ms. Pester is a 59 y/o woman with a history of COPD and tobacco abuse who has been feeling ill for about a week prior to admission. She had been feeling better but started feeling worse again. She fell out of a chair 8/12 and hit her head. Upon arrival to the ED she was hypoxic and hypotensive; hospital course complicated by confusion and agitation   Clinical Impression   Pt admitted with the above diagnoses and presents with below problem list. Pt will benefit from continued acute OT to address the below listed deficits and maximize independence with basic ADLs prior to d/c to venue below. Pt reports that PTA she did not use AD for walking; endorses walking only household distances. No family present to confirm and still unsure if she needed assist with basic ADLs PTA. Pt currently min A with LB ADLs and functional transfers/mobility due to some unsteadiness, impulsivity, and decreased awareness. Did not use AD this session but she may do better with that especially as mentation, hopefully, continues to improve. She currently will need 24/7 supervision/assist at home. Unsure if family is able to provide this.       Follow Up Recommendations  Supervision/Assistance - 24 hour;Home health OT;SNF    Equipment Recommendations  None recommended by OT    Recommendations for Other Services       Precautions / Restrictions Precautions Precautions: Fall Restrictions Weight Bearing Restrictions: No      Mobility Bed Mobility Overal bed mobility: Needs Assistance Bed Mobility: Supine to Sit     Supine to sit: Min guard     General bed mobility comments: min guard for safety, cues to sit EOB until lines prepped.  Transfers Overall transfer level: Needs assistance   Transfers: Sit to/from Stand;Stand Pivot Transfers Sit to Stand: Min  assist Stand pivot transfers: Min assist       General transfer comment: min A to steady at times. pt seeking external support of furniture.    Balance Overall balance assessment: Needs assistance Sitting-balance support: Single extremity supported;Bilateral upper extremity supported Sitting balance-Leahy Scale: Fair       Standing balance-Leahy Scale: Poor Standing balance comment: seeks external support                           ADL either performed or assessed with clinical judgement   ADL Overall ADL's : Needs assistance/impaired Eating/Feeding: Set up;Sitting   Grooming: Set up;Sitting Grooming Details (indicate cue type and reason): cueing for throughness, attention to full completion of task Upper Body Bathing: Set up;Sitting Upper Body Bathing Details (indicate cue type and reason): cueing for throughness, attention to full completion of task Lower Body Bathing: Minimal assistance;Sit to/from stand   Upper Body Dressing : Set up;Sitting Upper Body Dressing Details (indicate cue type and reason): cueing for throughness, attention to full completion of task Lower Body Dressing: Minimal assistance;Sit to/from stand   Toilet Transfer: Min guard;Minimal assistance;Ambulation Toilet Transfer Details (indicate cue type and reason): pivotal steps to recliner then walked in place a couple of minutes with occasion breif,standing rest break. Unsteadiness noted, needing min A at times.  Toileting- Clothing Manipulation and Hygiene: Minimal assistance;Sit to/from stand   Tub/ Shower Transfer: Minimal assistance   Functional mobility during ADLs: Minimal assistance General ADL Comments: Pt completed bed mobility, sat EOB < 1  minute before initiating standing to access recliner despite cueing from therapist to wait until lines were prepped and someone by her. Impulsive, flat affect.      Vision         Perception     Praxis      Pertinent Vitals/Pain Pain  Assessment: Faces Faces Pain Scale: Hurts little more Pain Location: back Pain Descriptors / Indicators: Aching Pain Intervention(s): Monitored during session;Repositioned     Hand Dominance     Extremity/Trunk Assessment Upper Extremity Assessment Upper Extremity Assessment: Generalized weakness   Lower Extremity Assessment Lower Extremity Assessment: Defer to PT evaluation       Communication Communication Communication: No difficulties   Cognition Arousal/Alertness: Awake/alert Behavior During Therapy: Impulsive;Flat affect Overall Cognitive Status: Impaired/Different from baseline Area of Impairment: Attention;Following commands;Safety/judgement;Awareness;Problem solving                   Current Attention Level:  (approaching sustained)   Following Commands: Follows one step commands inconsistently Safety/Judgement: Decreased awareness of safety;Decreased awareness of deficits Awareness: Intellectual Problem Solving: Difficulty sequencing;Requires verbal cues;Requires tactile cues General Comments: Despite cues, pt attempting to mobilize prematurely while therapist preparing lines. Pt with impuslivity and decreased awareness of lines.   General Comments       Exercises     Shoulder Instructions      Home Living Family/patient expects to be discharged to:: Private residence Living Arrangements: Spouse/significant other                 Bathroom Shower/Tub: Tub/shower unit         Home Equipment: Environmental consultant - 2 wheels;Cane - single point;Shower seat   Additional Comments: Pt able to give some basic home setup/PLOF data. No family present to confirm accuracy      Prior Functioning/Environment          Comments: pt reports she does not utilize AD. walks short distances (ie around the house, maybe to mailbox on a good day).        OT Problem List: Decreased strength;Decreased activity tolerance;Impaired balance (sitting and/or  standing);Decreased cognition;Decreased safety awareness;Decreased knowledge of use of DME or AE;Decreased knowledge of precautions;Pain      OT Treatment/Interventions: Self-care/ADL training;Therapeutic exercise;DME and/or AE instruction;Energy conservation;Therapeutic activities;Cognitive remediation/compensation;Patient/family education;Balance training    OT Goals(Current goals can be found in the care plan section) Acute Rehab OT Goals Patient Stated Goal: home  OT Goal Formulation: With patient Time For Goal Achievement: 03/15/20 Potential to Achieve Goals: Good ADL Goals Pt Will Perform Grooming: with modified independence;standing Pt Will Perform Upper Body Dressing: with modified independence;sitting;with set-up Pt Will Perform Lower Body Dressing: with modified independence;sit to/from stand Pt Will Transfer to Toilet: ambulating;with modified independence Pt Will Perform Toileting - Clothing Manipulation and hygiene: with modified independence;sit to/from stand  OT Frequency: Min 2X/week   Barriers to D/C:    unsure how much assist she has available at home.        Co-evaluation              AM-PAC OT "6 Clicks" Daily Activity     Outcome Measure Help from another person eating meals?: None Help from another person taking care of personal grooming?: A Little Help from another person toileting, which includes using toliet, bedpan, or urinal?: A Little Help from another person bathing (including washing, rinsing, drying)?: A Little Help from another person to put on and taking off regular upper body clothing?: None Help from another person to  put on and taking off regular lower body clothing?: A Little 6 Click Score: 20   End of Session Equipment Utilized During Treatment: Oxygen  Activity Tolerance: Patient tolerated treatment well Patient left: in chair;with call bell/phone within reach;with chair alarm set  OT Visit Diagnosis: Unsteadiness on feet  (R26.81);Muscle weakness (generalized) (M62.81);History of falling (Z91.81);Pain;Other symptoms and signs involving cognitive function                Time: 1235-1255 OT Time Calculation (min): 20 min Charges:  OT General Charges $OT Visit: 1 Visit OT Evaluation $OT Eval Low Complexity: 1 Low  Raynald Kemp, OT Acute Rehabilitation Services Pager: [phone] Office: [phone]   Pilar Grammes 03/01/2020, 2:28 PM

## 2020-03-01 NOTE — Plan of Care (Signed)
  Problem: Education: Goal: Knowledge of General Education information will improve Description: Including pain rating scale, medication(s)/side effects and non-pharmacologic comfort measures Outcome: Completed/Met

## 2020-03-01 NOTE — Plan of Care (Signed)
Alertness and orientation much improved than when this RN encountered pt on Sunday. Strength improved. Compares previous confusioin to when, "I  dropped acid at age 59."

## 2020-03-02 DIAGNOSIS — A419 Sepsis, unspecified organism: Secondary | ICD-10-CM | POA: Diagnosis not present

## 2020-03-02 DIAGNOSIS — R6521 Severe sepsis with septic shock: Secondary | ICD-10-CM | POA: Diagnosis not present

## 2020-03-02 MED ORDER — AMOXICILLIN-POT CLAVULANATE 875-125 MG PO TABS
1.0000 | ORAL_TABLET | Freq: Two times a day (BID) | ORAL | 0 refills | Status: AC
Start: 2020-03-02 — End: 2020-03-04

## 2020-03-02 NOTE — Progress Notes (Deleted)
Physician Discharge Summary  Erin Lynch ZOX:[id number] DOB: December 15, 1960 DOA: 02/24/2020  PCP: Galvin Proffer, MD  Admit date: 02/24/2020 Discharge date: 03/02/2020  Time spent: 35 minutes  Recommendations for Outpatient Follow-up:  1. PCP in 1 week, please review all images from this admission 2. FU CXR in 6 weeks 3. Home health services 4. Psychiatry in 2 weeks 5. Workup for normocytic anemia   Discharge Diagnoses:  Principal Problem:   Septic shock (HCC)   Acute hypoxic resp failure   Delirium   Agitation   Anxiety   Hyperlipidemia   Bipolar disorder, curr episode depressed, severe, w/psychotic features (HCC)   Pneumonia   Confusion   AKI (acute kidney injury) (HCC)   COPD with acute exacerbation (HCC)   TObacco abuse   COPD  Discharge Condition: stable  Diet recommendation: heart healthy  Filed Weights   02/27/20 1134 02/28/20 2034 03/01/20 0535  Weight: 82.1 kg 80.7 kg 78.5 kg    History of present illness:  Erin Lynch is a 59 years old female with past medical history of COPD and tobacco abuse, Bipolar d/o, depression, anxiety presented to hospital with complaints of not feeling well and fall.  EMS was called in and was noted to be febrile with shortness of breath.  Patient takes inhalers at home but was not on oxygen.  In the ED, she was noted to be hypoxic and hypotensive.  She was noted to have pneumonia.  Patient required central venous catheter and vasopressors, IV antibiotics and was admitted to the ICU  Hospital Course:   Septicshock secondary to pneumonia.   Acute hypoxic respiratory failure -Patient was admitted to the ICU with septic shock, treated with fluids, vasopressors and broad-spectrum antibiotics  -Sputum cultures grew Klebsiella, currently on IV ceftriaxone  -Blood cultures with coag negative staph in 1 set consistent with contamination  -Wean off O2  -Taper off IV steroids -Ambulate, out of bed  Agitation confusion, restlessness  -Likely  due to ICU delirium and steroid psychosis  -Patient also has an underlying history of anxiety/depression and bipolar disorder  -Agitation is improving, taper off steroids  -Resume home regimen of Xanax, Latuda and Cymbalta   History of anxiety, bipolar disorder with psychotic features.    -Resumed home regimen of Xanax/Cymbalta and Latuda, see discussion above   COPD, ongoing tobacco abuse, with acute exacerbation - Continue LABA/ICS/LAMA inpatient. Resume home Trelegy at discharge -Wean off steroids, not actively wheezing at this time -Smoking cessation counseled, nicotine patch  Acute kidney injury.   -On presentation.  Has resolved at this time.  Creatinine of 0.7.  Hypovolemic hyponatremia, -Resolved   Hypokalemia.   -Replaced  Chronic normocytic anemia -Stable, no active bleeding, recommend outpatient work-up for this  History of essential HTN, HLD, CAD. Chronic HFpEF 2D echocardiogram with preserved LV function.   -Resumed metoprolol and lisinopril     Hypothyroidism Continue synthroid  Restless leg syndrome.  Resumed gabapentin from home.     Discharge Exam: Vitals:   03/02/20 0703 03/02/20 0925  BP: 134/72 (!) 124/54  Pulse: 60 61  Resp:  18  Temp:  97.9 F (36.6 C)  SpO2:  95%    General: AAOx3 Cardiovascular: S1S2/RRR Respiratory: improved air movement  Discharge Instructions   Discharge Instructions    Diet - low sodium heart healthy   Complete by: As directed    Increase activity slowly   Complete by: As directed      Allergies as of 03/02/2020   No Known  Allergies     Medication List    TAKE these medications   ALPRAZolam 1 MG tablet Commonly known as: XANAX Take 1 mg by mouth 3 (three) times daily as needed for anxiety.   amoxicillin-clavulanate 875-125 MG tablet Commonly known as: Augmentin Take 1 tablet by mouth 2 (two) times daily for 2 days.   aspirin 81 MG EC tablet Take 1 tablet (81 mg total) by mouth daily.    atorvastatin 80 MG tablet Commonly known as: LIPITOR Take 80 mg by mouth daily.   clopidogrel 75 MG tablet Commonly known as: PLAVIX TAKE 1 TABLET BY MOUTH EVERY DAY   desvenlafaxine 50 MG 24 hr tablet Commonly known as: PRISTIQ Take 50 mg by mouth daily.   DULoxetine 60 MG capsule Commonly known as: CYMBALTA Take 60 mg by mouth daily.   furosemide 40 MG tablet Commonly known as: LASIX Take 40 mg by mouth daily.   gabapentin 600 MG tablet Commonly known as: NEURONTIN Take 600 mg by mouth at bedtime.   isosorbide mononitrate 30 MG 24 hr tablet Commonly known as: IMDUR TAKE 0.5 TABLETS (15 MG TOTAL) BY MOUTH DAILY.   Latuda 40 MG Tabs tablet Generic drug: lurasidone Take 40 mg by mouth daily.   levothyroxine 125 MCG tablet Commonly known as: SYNTHROID Take 125 mcg by mouth daily.   lisinopril 5 MG tablet Commonly known as: ZESTRIL Take 5 mg by mouth daily.   metoprolol succinate 25 MG 24 hr tablet Commonly known as: TOPROL-XL Take 25 mg by mouth daily.   nicotine 21 mg/24hr patch Commonly known as: NICODERM CQ - dosed in mg/24 hours Place 1 patch (21 mg total) onto the skin daily.   nitroGLYCERIN 0.4 MG SL tablet Commonly known as: Nitrostat Place 1 tablet (0.4 mg total) under the tongue every 5 (five) minutes as needed for chest pain.   omeprazole 40 MG capsule Commonly known as: PRILOSEC Take 40 mg by mouth daily.   rosuvastatin 10 MG tablet Commonly known as: CRESTOR Take 10 mg by mouth daily.   Trelegy Ellipta 100-62.5-25 MCG/INH Aepb Generic drug: Fluticasone-Umeclidin-Vilant Inhale 1 puff into the lungs daily.   Ventolin HFA 108 (90 Base) MCG/ACT inhaler Generic drug: albuterol Inhale 2 puffs into the lungs every 6 (six) hours as needed for shortness of breath.   zolpidem 10 MG tablet Commonly known as: AMBIEN Take 10 mg by mouth at bedtime as needed for sleep.      No Known Allergies  Follow-up Information    Hague, Myrene Galas, MD.  Schedule an appointment as soon as possible for a visit in 1 week(s).   Specialty: Internal Medicine Why: Also request referral for Psychiatrist Contact information: [address] [place] [address] [phone]        [facility] Follow up.   Why: the office will call to schedule visits late next week Contact information: [address] , [place] [address]  [phone]               The results of significant diagnostics from this hospitalization (including imaging, microbiology, ancillary and laboratory) are listed below for reference.    Significant Diagnostic Studies: CT Head Wo Contrast  Result Date: 02/24/2020 CLINICAL DATA:  Fall, hit back of head.  On blood thinners. EXAM: CT HEAD WITHOUT CONTRAST TECHNIQUE: Contiguous axial images were obtained from the base of the skull through the vertex without intravenous contrast. COMPARISON:  01/19/2019 FINDINGS: Brain: No acute intracranial abnormality. Specifically,  no hemorrhage, hydrocephalus, mass lesion, acute infarction, or significant intracranial injury. Vascular: No hyperdense vessel or unexpected calcification. Skull: No acute calvarial abnormality. Sinuses/Orbits: Visualized paranasal sinuses and mastoids clear. Orbital soft tissues unremarkable. Other: None IMPRESSION: Normal study. Electronically Signed   By: Charlett Nose M.D.   On: 02/24/2020 20:46   CT Chest Wo Contrast  Result Date: 02/24/2020 CLINICAL DATA:  Chest pain EXAM: CT CHEST WITHOUT CONTRAST TECHNIQUE: Multidetector CT imaging of the chest was performed following the standard protocol without IV contrast. COMPARISON:  10/02/2018 FINDINGS: Cardiovascular: Left anterior descending coronary artery stenting has been performed. Global cardiac size within normal limits. Central pulmonary arteries are of normal caliber. Thoracic aorta is unremarkable. Mediastinum/Nodes: No pathologic thoracic adenopathy. Small hiatal hernia. Esophagus  unremarkable. Thyroid gland absent, possibly postsurgical or post ablated in nature. Lungs/Pleura: Multifocal peribronchial nodular infiltrates and scattered areas of pulmonary consolidation are identified most in keeping with atypical infection, more severe within the right lung. No pneumothorax or pleural effusion. Central airways are widely patent. Upper Abdomen: Unremarkable Musculoskeletal: Unremarkable IMPRESSION: Multifocal peribronchial nodular infiltrates and scattered areas of pulmonary consolidation are identified most in keeping with atypical infection, more severe within the right lung. The findings are not pathognomonic of COVID-19 pneumonia and additional organism should be considered. Electronically Signed   By: Helyn Numbers MD   On: 02/24/2020 20:50   DG CHEST PORT 1 VIEW  Result Date: 02/25/2020 CLINICAL DATA:  59 year old female with central line placement. EXAM: PORTABLE CHEST 1 VIEW COMPARISON:  Chest radiograph dated 02/24/2020. FINDINGS: Right IJ central venous line with tip over central SVC in similar position. Faint scattered bilateral pulmonary densities again noted which appears slightly improved over the left lung base compared to prior radiograph. No focal consolidation, pleural effusion, or pneumothorax. The cardiac silhouette is within limits. Coronary vascular calcifications noted. No acute osseous pathology. IMPRESSION: 1. Right IJ central venous line with tip over central SVC in similar position. 2. Slight interval improvement of faint pulmonary densities compared to prior radiograph. Electronically Signed   By: Elgie Collard M.D.   On: 02/25/2020 20:27   DG Chest Port 1 View  Result Date: 02/24/2020 CLINICAL DATA:  CVC placement EXAM: PORTABLE CHEST 1 VIEW COMPARISON:  02/24/2020, CT 02/24/2020 FINDINGS: Right-sided central venous catheter with tip projecting over the distal SVC. There is no pneumothorax identified. Vague bilateral foci of airspace disease  corresponding to CT areas of pneumonia. Mild cardiomegaly. IMPRESSION: Right-sided central venous catheter tip overlies the distal SVC. No pneumothorax. Mild cardiomegaly. Scattered foci of airspace disease corresponding to CT demonstrated pneumonia. Electronically Signed   By: Jasmine Pang M.D.   On: 02/24/2020 22:55   DG Chest Portable 1 View  Result Date: 02/24/2020 CLINICAL DATA:  Dyspnea EXAM: PORTABLE CHEST 1 VIEW COMPARISON:  None. FINDINGS: Triangular opacity within the right upper lung zone is new from prior examination and is indeterminate. This may represent pleural plaque, better visualized due to rotation of the patient on this examination, however, a focal pulmonary infiltrate or pulmonary nodule is difficult to exclude. Lungs are otherwise clear. No pneumothorax or pleural effusion. Cardiac size within normal limits. Remote left humeral neck fracture again noted. No acute bone abnormality. IMPRESSION: Triangular opacity within the right upper lung zone is new from prior examination and is indeterminate. This may represent pleural plaque, however, a focal pulmonary infiltrate or pulmonary nodule is difficult to exclude. This could be further assessed with dedicated two view chest radiograph or CT examination once the  patient's acute issues have resolved. Electronically Signed   By: Helyn NumbersAshesh  Parikh MD   On: 02/24/2020 21:13   ECHOCARDIOGRAM COMPLETE  Result Date: 02/27/2020    ECHOCARDIOGRAM REPORT   Patient Name:   Erin Lynch Date of Exam: 02/27/2020 Medical Rec #:  [id number]   Height:       63.0 in Accession #:    [id number]  Weight:       181.0 lb Date of Birth:  11/18/1960    BSA:          1.853 m Patient Age:    59 years    BP:           152/88 mmHg Patient Gender: F           HR:           62 bpm. Exam Location:  Inpatient Procedure: 2D Echo Indications:    bacteremia  History:        Patient has prior history of Echocardiogram examinations, most                 recent 02/01/2019. CHF, CAD;  Risk Factors:Hypertension,                 Dyslipidemia and Current Smoker.  Sonographer:    Celene SkeenVijay Shankar RDCS (AE) Referring Phys: [id number] CHI JANE ELLISON  Sonographer Comments: Image acquisition challenging due to respiratory motion and Image acquisition challenging due to patient body habitus. IMPRESSIONS  1. Left ventricular ejection fraction, by estimation, is 55 to 60%. The left ventricle has normal function. The left ventricle has no regional wall motion abnormalities. The left ventricular internal cavity size was mildly dilated. Left ventricular diastolic parameters were normal.  2. Right ventricular systolic function is normal. The right ventricular size is normal. There is moderately elevated pulmonary artery systolic pressure.  3. The mitral valve is normal in structure. Trivial mitral valve regurgitation. No evidence of mitral stenosis.  4. Tricuspid valve regurgitation is mild to moderate.  5. The aortic valve has an indeterminant number of cusps. Aortic valve regurgitation is mild to moderate.  6. The inferior vena cava is dilated in size with <50% respiratory variability, suggesting right atrial pressure of 15 mmHg. Conclusion(s)/Recommendation(s): No evidence of valvular vegetations on this transthoracic echocardiogram. Would recommend a transesophageal echocardiogram to exclude infective endocarditis if clinically indicated. FINDINGS  Left Ventricle: Left ventricular ejection fraction, by estimation, is 55 to 60%. The left ventricle has normal function. The left ventricle has no regional wall motion abnormalities. The left ventricular internal cavity size was mildly dilated. There is  borderline left ventricular hypertrophy. Left ventricular diastolic parameters were normal. Right Ventricle: The right ventricular size is normal. No increase in right ventricular wall thickness. Right ventricular systolic function is normal. There is moderately elevated pulmonary artery systolic pressure. The  tricuspid regurgitant velocity is 2.81 m/s, and with an assumed right atrial pressure of 15 mmHg, the estimated right ventricular systolic pressure is 46.6 mmHg. Left Atrium: Left atrial size was normal in size. Right Atrium: Right atrial size was normal in size. Pericardium: There is no evidence of pericardial effusion. Mitral Valve: The mitral valve is normal in structure. Trivial mitral valve regurgitation. No evidence of mitral valve stenosis. Tricuspid Valve: The tricuspid valve is normal in structure. Tricuspid valve regurgitation is mild to moderate. Aortic Valve: The aortic valve has an indeterminant number of cusps. . There is mild thickening and mild calcification of the aortic valve. Aortic valve regurgitation is mild  to moderate. There is mild thickening of the aortic valve. There is mild calcification of the aortic valve. Pulmonic Valve: The pulmonic valve was not well visualized. Pulmonic valve regurgitation is not visualized. Aorta: The ascending aorta was not well visualized and the aortic arch was not well visualized. Venous: The inferior vena cava is dilated in size with less than 50% respiratory variability, suggesting right atrial pressure of 15 mmHg. IAS/Shunts: No atrial level shunt detected by color flow Doppler.  LEFT VENTRICLE PLAX 2D LVIDd:         5.33 cm  Diastology LVIDs:         3.87 cm  LV e' lateral:   9.14 cm/s LV PW:         1.10 cm  LV E/e' lateral: 12.1 LV IVS:        1.10 cm  LV e' medial:    8.38 cm/s LVOT diam:     1.80 cm  LV E/e' medial:  13.2 LV SV:         67 LV SV Index:   36 LVOT Area:     2.54 cm  RIGHT VENTRICLE RV S prime:     9.36 cm/s TAPSE (M-mode): 2.6 cm LEFT ATRIUM           Index       RIGHT ATRIUM           Index LA diam:      3.60 cm 1.94 cm/m  RA Area:     13.70 cm LA Vol (A2C): 47.0 ml 25.36 ml/m RA Volume:   35.60 ml  19.21 ml/m LA Vol (A4C): 26.2 ml 14.14 ml/m  AORTIC VALVE LVOT Vmax:   109.00 cm/s LVOT Vmean:  78.000 cm/s LVOT VTI:    0.265 m  AORTA  Ao Root diam: 3.10 cm MITRAL VALVE                TRICUSPID VALVE MV Area (PHT): 4.80 cm     TR Peak grad:   31.6 mmHg MV Decel Time: 158 msec     TR Vmax:        281.00 cm/s MV E velocity: 111.00 cm/s MV A velocity: 100.00 cm/s  SHUNTS MV E/A ratio:  1.11         Systemic VTI:  0.26 m                             Systemic Diam: 1.80 cm Jodelle RedBridgette Christopher MD Electronically signed by Jodelle RedBridgette Christopher MD Signature Date/Time: 02/27/2020/3:54:13 PM    Final     Microbiology: Recent Results (from the past 240 hour(s))  Blood Culture (routine x 2)     Status: Abnormal   Collection Time: 02/24/20 12:21 AM   Specimen: BLOOD RIGHT ARM  Result Value Ref Range Status   Specimen Description BLOOD RIGHT ARM  Final   Special Requests   Final    BOTTLES DRAWN AEROBIC AND ANAEROBIC Blood Culture adequate volume   Culture  Setup Time   Final    GRAM POSITIVE COCCI IN CLUSTERS AEROBIC BOTTLE ONLY Organism ID to follow CRITICAL RESULT CALLED TO, READ BACK BY AND VERIFIED WITH: F,WILSON PHARMD @2009  02/25/20 EB    Culture (A)  Final    STAPHYLOCOCCUS SPECIES (COAGULASE NEGATIVE) THE SIGNIFICANCE OF ISOLATING THIS ORGANISM FROM A SINGLE SET OF BLOOD CULTURES WHEN MULTIPLE SETS ARE DRAWN IS UNCERTAIN. PLEASE NOTIFY THE MICROBIOLOGY DEPARTMENT WITHIN ONE WEEK  IF SPECIATION AND SENSITIVITIES ARE REQUIRED. Performed at [facility], [address] [place], [place] [address]    Report Status 02/26/2020 FINAL  Final  Blood Culture ID Panel (Reflexed)     Status: Abnormal   Collection Time: 02/24/20 12:21 AM  Result Value Ref Range Status   Enterococcus faecalis NOT DETECTED NOT DETECTED Final   Enterococcus Faecium NOT DETECTED NOT DETECTED Final   Listeria monocytogenes NOT DETECTED NOT DETECTED Final   Staphylococcus species DETECTED (A) NOT DETECTED Final    Comment: RESULT CALLED TO, READ BACK BY AND VERIFIED WITH: F,WILSON PHARMD @2009  02/25/20 EB    Staphylococcus aureus (BCID) NOT DETECTED  NOT DETECTED Final   Staphylococcus epidermidis NOT DETECTED NOT DETECTED Final   Staphylococcus lugdunensis NOT DETECTED NOT DETECTED Final   Streptococcus species NOT DETECTED NOT DETECTED Final   Streptococcus agalactiae NOT DETECTED NOT DETECTED Final   Streptococcus pneumoniae NOT DETECTED NOT DETECTED Final   Streptococcus pyogenes NOT DETECTED NOT DETECTED Final   A.calcoaceticus-baumannii NOT DETECTED NOT DETECTED Final   Bacteroides fragilis NOT DETECTED NOT DETECTED Final   Enterobacterales NOT DETECTED NOT DETECTED Final   Enterobacter cloacae complex NOT DETECTED NOT DETECTED Final   Escherichia coli NOT DETECTED NOT DETECTED Final   Klebsiella aerogenes NOT DETECTED NOT DETECTED Final   Klebsiella oxytoca NOT DETECTED NOT DETECTED Final   Klebsiella pneumoniae NOT DETECTED NOT DETECTED Final   Proteus species NOT DETECTED NOT DETECTED Final   Salmonella species NOT DETECTED NOT DETECTED Final   Serratia marcescens NOT DETECTED NOT DETECTED Final   Haemophilus influenzae NOT DETECTED NOT DETECTED Final   Neisseria meningitidis NOT DETECTED NOT DETECTED Final   Pseudomonas aeruginosa NOT DETECTED NOT DETECTED Final   Stenotrophomonas maltophilia NOT DETECTED NOT DETECTED Final   Candida albicans NOT DETECTED NOT DETECTED Final   Candida auris NOT DETECTED NOT DETECTED Final   Candida glabrata NOT DETECTED NOT DETECTED Final   Candida krusei NOT DETECTED NOT DETECTED Final   Candida parapsilosis NOT DETECTED NOT DETECTED Final   Candida tropicalis NOT DETECTED NOT DETECTED Final   Cryptococcus neoformans/gattii NOT DETECTED NOT DETECTED Final    Comment: Performed at [facility], [address], [place], [place]  SARS Coronavirus 2 by RT PCR (hospital order, performed in [facility] hospital lab) Nasopharyngeal Nasopharyngeal Swab     Status: None   Collection Time: 02/24/20  8:23 PM   Specimen: Nasopharyngeal Swab  Result Value Ref Range Status   SARS  Coronavirus 2 NEGATIVE NEGATIVE Final    Comment: (NOTE) SARS-CoV-2 target nucleic acids are NOT DETECTED.  The SARS-CoV-2 RNA is generally detectable in upper and lower respiratory specimens during the acute phase of infection. The lowest concentration of SARS-CoV-2 viral copies this assay can detect is 250 copies / mL. A negative result does not preclude SARS-CoV-2 infection and should not be used as the sole basis for treatment or other patient management decisions.  A negative result may occur with improper specimen collection / handling, submission of specimen other than nasopharyngeal swab, presence of viral mutation(s) within the areas targeted by this assay, and inadequate number of viral copies (<250 copies / mL). A negative result must be combined with clinical observations, patient history, and epidemiological information.  Fact Sheet for Patients:   04/25/20  Fact Sheet for Healthcare Providers: BoilerBrush.com.cy  This test is not yet approved or  cleared by the https://pope.com/ FDA and has been authorized for detection  and/or diagnosis of SARS-CoV-2 by FDA under an Emergency Use Authorization (EUA).  This EUA will remain in effect (meaning this test can be used) for the duration of the COVID-19 declaration under Section 564(b)(1) of the Act, 21 U.S.C. section 360bbb-3(b)(1), unless the authorization is terminated or revoked sooner.  Performed at [facility], [address] [place], [place] [address]   Blood Culture (routine x 2)     Status: None   Collection Time: 02/24/20 10:27 PM   Specimen: BLOOD  Result Value Ref Range Status   Specimen Description   Final    BLOOD LEFT ANTECUBITAL Performed at [facility], [address] [place], [place] [address]    Special Requests   Final    BOTTLES DRAWN AEROBIC AND ANAEROBIC Blood Culture adequate volume Performed at [facility], [address], [place], [place] [address]    Culture   Final    NO GROWTH 5 DAYS Performed at [facility], [address], [place], [place] [address]    Report Status 03/01/2020 FINAL  Final  Expectorated sputum assessment w rflx to resp cult     Status: None   Collection Time: 02/25/20  1:30 AM   Specimen: Expectorated Sputum  Result Value Ref Range Status   Specimen Description EXPECTORATED SPUTUM  Final   Special Requests NONE  Final   Sputum evaluation   Final    THIS SPECIMEN IS ACCEPTABLE FOR SPUTUM CULTURE Performed at [facility], [address] [place], [place] [address]    Report Status 02/25/2020 FINAL  Final  Culture, respiratory     Status: None   Collection Time: 02/25/20  1:30 AM  Result Value Ref Range Status   Specimen Description EXPECTORATED SPUTUM  Final   Special Requests NONE Reflexed from [id number]  Final   Gram Stain   Final    ABUNDANT WBC PRESENT, PREDOMINANTLY PMN MODERATE GRAM POSITIVE COCCI FEW GRAM NEGATIVE RODS Performed at [facility], [address] [place], [place] [address]    Culture MODERATE KLEBSIELLA PNEUMONIAE  Final   Report Status 02/27/2020 FINAL  Final   Organism ID, Bacteria KLEBSIELLA PNEUMONIAE  Final      Susceptibility   Klebsiella pneumoniae - MIC*    AMPICILLIN RESISTANT Resistant     CEFAZOLIN <=4 SENSITIVE Sensitive     CEFEPIME <=0.12 SENSITIVE Sensitive     CEFTAZIDIME <=1 SENSITIVE Sensitive     CEFTRIAXONE <=0.25 SENSITIVE Sensitive     CIPROFLOXACIN <=0.25 SENSITIVE Sensitive     GENTAMICIN <=1 SENSITIVE Sensitive     IMIPENEM <=0.25 SENSITIVE Sensitive     TRIMETH/SULFA <=20 SENSITIVE Sensitive     AMPICILLIN/SULBACTAM 4 SENSITIVE Sensitive     PIP/TAZO <=4 SENSITIVE Sensitive     * MODERATE KLEBSIELLA PNEUMONIAE  MRSA PCR Screening     Status: Abnormal   Collection Time: 02/25/20  2:21 AM   Specimen: Nasopharyngeal  Result Value Ref Range Status   MRSA by PCR POSITIVE (A) NEGATIVE Final    Comment:         The GeneXpert MRSA Assay (FDA approved for NASAL specimens only), is one component of a comprehensive MRSA colonization surveillance program. It is not intended to diagnose MRSA infection nor to guide or monitor treatment for MRSA infections. RESULT CALLED TO, READ BACK BY AND VERIFIED WITH: Baldo Ash 02/25/20 0500 JDW Performed at [facility], [address], [place], [place] [address]  Respiratory Panel by PCR     Status: None   Collection Time: 02/26/20  3:02 PM   Specimen: Nasopharyngeal Swab; Respiratory  Result Value Ref Range Status   Adenovirus NOT DETECTED NOT DETECTED Final   Coronavirus 229E NOT DETECTED NOT DETECTED Final    Comment: (NOTE) The Coronavirus on the Respiratory Panel, DOES NOT test for the novel  Coronavirus (2019 nCoV)    Coronavirus HKU1 NOT DETECTED NOT DETECTED Final   Coronavirus NL63 NOT DETECTED NOT DETECTED Final   Coronavirus OC43 NOT DETECTED NOT DETECTED Final   Metapneumovirus NOT DETECTED NOT DETECTED Final   Rhinovirus / Enterovirus NOT DETECTED NOT DETECTED Final   Influenza A NOT DETECTED NOT DETECTED Final   Influenza B NOT DETECTED NOT DETECTED Final   Parainfluenza Virus 1 NOT DETECTED NOT DETECTED Final   Parainfluenza Virus 2 NOT DETECTED NOT DETECTED Final   Parainfluenza Virus 3 NOT DETECTED NOT DETECTED Final   Parainfluenza Virus 4 NOT DETECTED NOT DETECTED Final   Respiratory Syncytial Virus NOT DETECTED NOT DETECTED Final   Bordetella pertussis NOT DETECTED NOT DETECTED Final   Chlamydophila pneumoniae NOT DETECTED NOT DETECTED Final   Mycoplasma pneumoniae NOT DETECTED NOT DETECTED Final    Comment: Performed at [facility], [address], [place], [place] [address]  Culture, blood (routine x 2)     Status: None (Preliminary result)   Collection Time: 02/27/20  5:09 AM   Specimen: BLOOD  Result Value Ref Range Status   Specimen Description BLOOD RIGHT ARM  Final   Special Requests   Final    BOTTLES  DRAWN AEROBIC ONLY Blood Culture adequate volume   Culture   Final    NO GROWTH 4 DAYS Performed at [facility], [address], [place], [place] [address]    Report Status PENDING  Incomplete  Culture, blood (routine x 2)     Status: None (Preliminary result)   Collection Time: 02/27/20  5:15 AM   Specimen: BLOOD  Result Value Ref Range Status   Specimen Description BLOOD LEFT ARM  Final   Special Requests   Final    BOTTLES DRAWN AEROBIC AND ANAEROBIC Blood Culture adequate volume   Culture   Final    NO GROWTH 4 DAYS Performed at [facility], [address] [place], [place] [address]    Report Status PENDING  Incomplete     Labs: Basic Metabolic Panel: Recent Labs  Lab 02/26/20 0402 02/27/20 0336 02/28/20 0319 02/29/20 1140 03/01/20 0414  NA 139 138 142 144 142  K 3.3* 3.9 3.8 3.4* 3.4*  CL 106 109 109 112* 113*  CO2 23 21* GLUCOSE 163* 110* 89 90 97  BUN CREATININE 1.52* 0.89 0.87 0.79 0.76  CALCIUM 8.3* 8.3* 8.8* 8.7* 8.0*  MG  --  1.7  --  2.2  --    Liver Function Tests: Recent Labs  Lab 02/24/20 2219 02/29/20 1140  AST 18 16  ALT 13 16  ALKPHOS 86 70  BILITOT 0.5 0.5  PROT 5.1* 4.9*  ALBUMIN 2.5* 2.5*   No results for input(s): LIPASE, AMYLASE in the last 168 hours. No results for input(s): AMMONIA in the last 168 hours. CBC: Recent Labs  Lab 02/24/20 2330 02/25/20 0240 02/27/20 0336 02/29/20 1140 03/01/20 0414  WBC 38.7* 40.8* 13.6* 6.7 6.0  NEUTROABS 33.2*  --   --   --   --  HGB 9.3* 10.1* 8.1* 8.5* 8.0*  HCT 30.3* 32.1* 26.3* 27.7* 26.7*  MCV 90.7 88.2 89.8 90.2 91.4  PLT 355 363 217 243 245   Cardiac Enzymes: No results for input(s): CKTOTAL, CKMB, CKMBINDEX, TROPONINI in the last 168 hours. BNP: BNP (last 3 results) Recent Labs    02/24/20 2330  BNP 201.2*    ProBNP (last 3 results) No results for input(s): PROBNP in the last 8760 hours.  CBG: Recent Labs  Lab 02/26/20 1622  02/27/20 1932  GLUCAP 130* 92       Signed:  Zannie Cove MD.  [facility] 03/02/2020, 11:29 AM

## 2020-03-02 NOTE — TOC Transition Note (Signed)
Transition of Care [facility]) - CM/SW Discharge Note   Patient Details  Name: [name] MRN: [id number] Date of Birth: Jan 26, 1961  Transition of Care [facility]) CM/SW Contact:  Bess Kinds, RN Phone Number: [phone] 03/02/2020, 11:12 AM   Clinical Narrative:     Patient to transition home today. Spoke with patient at the bedside who states that either her spouse or sister-n-law will pick her up. Discussed home health recommendations for therapy and provided agency choice list. Referral accepted by [facility] for start of care late next week. Patient agreeable to this start of care. MD contacted for [facility] orders and advised of start of care.  Patient states that her current home address is: [address] [place], [place] [address]. Patient has walker and shower seat at home.   Final next level of care: Home w Home Health Services Barriers to Discharge: No Barriers Identified   Patient Goals and CMS Choice Patient states their goals for this hospitalization and ongoing recovery are:: return home with husband CMS Medicare.gov Compare Post Acute Care list provided to:: Patient Choice offered to / list presented to : Patient  Discharge Placement                       Discharge Plan and Services In-house Referral: [facility] Discharge Planning Services: CM Consult Post Acute Care Choice: Home Health          DME Arranged: N/A DME Agency: NA       HH Arranged: PT, OT HH Agency: [facility] Date [facility] Agency Contacted: 03/02/20 Time HH Agency Contacted: 1112 Representative spoke with at [facility] Agency: Morrie Sheldon  Social Determinants of Health (SDOH) Interventions     Readmission Risk Interventions No flowsheet data found.

## 2020-03-02 NOTE — Consult Note (Signed)
   [facility] CM Inpatient Consult   03/02/2020  Erin Lynch 06-Jun-1961 [id number]  Insurance plan:  [name]     Thank you for this referral.  Unfortunately, our Care management team will not be able to assist you with this patient.    Reason:   Patient's primary care provider is not an in network provider at this time and listed as unattributed. Membership roster was used to verify patient status.   For additional questions or referrals please contact:    [name], RN BSN CCM [facility]  [phone] business mobile phone Toll free office [phone]  Fax number: [phone] [name]

## 2020-03-02 NOTE — Discharge Summary (Signed)
Physician Discharge Summary  Erin Lynch ZOX:[id number] DOB: 1960-12-05 DOA: 02/24/2020  PCP: Galvin Proffer, MD  Admit date: 02/24/2020 Discharge date: 03/02/2020  Time spent: 35 minutes  Recommendations for Outpatient Follow-up:  1. PCP in 1 week, please review all images from this admission 2. FU CXR in 6 weeks 3. Home health services 4. Psychiatry in 2 weeks 5. Workup for normocytic anemia   Discharge Diagnoses:  Principal Problem:   Septic shock (HCC)   Acute hypoxic resp failure   Delirium   Agitation   Anxiety   Hyperlipidemia   Bipolar disorder, curr episode depressed, severe, w/psychotic features (HCC)   Pneumonia   Confusion   AKI (acute kidney injury) (HCC)   COPD with acute exacerbation (HCC)   TObacco abuse   COPD  Discharge Condition: stable  Diet recommendation: heart healthy  Filed Weights   02/27/20 1134 02/28/20 2034 03/01/20 0535  Weight: 82.1 kg 80.7 kg 78.5 kg    History of present illness:  Ms. Erin Lynch is a 59 years old female with past medical history of COPD and tobacco abuse, Bipolar d/o, depression, anxiety presented to hospital with complaints of not feeling well and fall.  EMS was called in and was noted to be febrile with shortness of breath.  Patient takes inhalers at home but was not on oxygen.  In the ED, she was noted to be hypoxic and hypotensive.  She was noted to have pneumonia.  Patient required central venous catheter and vasopressors, IV antibiotics and was admitted to the ICU  Hospital Course:   Septicshock secondary to pneumonia.   Acute hypoxic respiratory failure -Patient was admitted to the ICU with septic shock, treated with fluids, vasopressors and broad-spectrum antibiotics  -Sputum cultures grew Klebsiella, currently on IV ceftriaxone  -Blood cultures with coag negative staph in 1 set consistent with contamination  -Wean off O2  -Taper off IV steroids -Ambulate, out of bed  Agitation confusion, restlessness  -Likely  due to ICU delirium and steroid psychosis  -Patient also has an underlying history of anxiety/depression and bipolar disorder  -Agitation is improving, taper off steroids  -Resume home regimen of Xanax, Latuda and Cymbalta   History of anxiety, bipolar disorder with psychotic features.    -Resumed home regimen of Xanax/Cymbalta and Latuda, see discussion above   COPD, ongoing tobacco abuse, with acute exacerbation - Continue LABA/ICS/LAMA inpatient. Resume home Trelegy at discharge -Wean off steroids, not actively wheezing at this time -Smoking cessation counseled, nicotine patch  Acute kidney injury.   -On presentation.  Has resolved at this time.  Creatinine of 0.7.  Hypovolemic hyponatremia, -Resolved   Hypokalemia.   -Replaced  Chronic normocytic anemia -Stable, no active bleeding, recommend outpatient work-up for this  History of essential HTN, HLD, CAD. Chronic HFpEF 2D echocardiogram with preserved LV function.   -Resumed metoprolol and lisinopril     Hypothyroidism Continue synthroid  Restless leg syndrome.  Resumed gabapentin from home.     Discharge Exam: Vitals:   03/02/20 0703 03/02/20 0925  BP: 134/72 (!) 124/54  Pulse: 60 61  Resp:  18  Temp:  97.9 F (36.6 C)  SpO2:  95%    General: AAOx3 Cardiovascular: S1S2/RRR Respiratory: improved air movement  Discharge Instructions   Discharge Instructions    Diet - low sodium heart healthy   Complete by: As directed    Increase activity slowly   Complete by: As directed      Allergies as of 03/02/2020   No Known  Allergies     Medication List    TAKE these medications   ALPRAZolam 1 MG tablet Commonly known as: XANAX Take 1 mg by mouth 3 (three) times daily as needed for anxiety.   amoxicillin-clavulanate 875-125 MG tablet Commonly known as: Augmentin Take 1 tablet by mouth 2 (two) times daily for 2 days.   aspirin 81 MG EC tablet Take 1 tablet (81 mg total) by mouth daily.    atorvastatin 80 MG tablet Commonly known as: LIPITOR Take 80 mg by mouth daily.   clopidogrel 75 MG tablet Commonly known as: PLAVIX TAKE 1 TABLET BY MOUTH EVERY DAY   desvenlafaxine 50 MG 24 hr tablet Commonly known as: PRISTIQ Take 50 mg by mouth daily.   DULoxetine 60 MG capsule Commonly known as: CYMBALTA Take 60 mg by mouth daily.   furosemide 40 MG tablet Commonly known as: LASIX Take 40 mg by mouth daily.   gabapentin 600 MG tablet Commonly known as: NEURONTIN Take 600 mg by mouth at bedtime.   isosorbide mononitrate 30 MG 24 hr tablet Commonly known as: IMDUR TAKE 0.5 TABLETS (15 MG TOTAL) BY MOUTH DAILY.   Latuda 40 MG Tabs tablet Generic drug: lurasidone Take 40 mg by mouth daily.   levothyroxine 125 MCG tablet Commonly known as: SYNTHROID Take 125 mcg by mouth daily.   lisinopril 5 MG tablet Commonly known as: ZESTRIL Take 5 mg by mouth daily.   metoprolol succinate 25 MG 24 hr tablet Commonly known as: TOPROL-XL Take 25 mg by mouth daily.   nicotine 21 mg/24hr patch Commonly known as: NICODERM CQ - dosed in mg/24 hours Place 1 patch (21 mg total) onto the skin daily.   nitroGLYCERIN 0.4 MG SL tablet Commonly known as: Nitrostat Place 1 tablet (0.4 mg total) under the tongue every 5 (five) minutes as needed for chest pain.   omeprazole 40 MG capsule Commonly known as: PRILOSEC Take 40 mg by mouth daily.   rosuvastatin 10 MG tablet Commonly known as: CRESTOR Take 10 mg by mouth daily.   Trelegy Ellipta 100-62.5-25 MCG/INH Aepb Generic drug: Fluticasone-Umeclidin-Vilant Inhale 1 puff into the lungs daily.   Ventolin HFA 108 (90 Base) MCG/ACT inhaler Generic drug: albuterol Inhale 2 puffs into the lungs every 6 (six) hours as needed for shortness of breath.   zolpidem 10 MG tablet Commonly known as: AMBIEN Take 10 mg by mouth at bedtime as needed for sleep.      No Known Allergies  Follow-up Information    Hague, Myrene Galas, MD.  Schedule an appointment as soon as possible for a visit in 1 week(s).   Specialty: Internal Medicine Why: Also request referral for Psychiatrist Contact information: [address] [place] [address] [phone]        [facility] Follow up.   Why: the office will call to schedule visits late next week Contact information: [address] , [place] [address]  [phone]               The results of significant diagnostics from this hospitalization (including imaging, microbiology, ancillary and laboratory) are listed below for reference.    Significant Diagnostic Studies: CT Head Wo Contrast  Result Date: 02/24/2020 CLINICAL DATA:  Fall, hit back of head.  On blood thinners. EXAM: CT HEAD WITHOUT CONTRAST TECHNIQUE: Contiguous axial images were obtained from the base of the skull through the vertex without intravenous contrast. COMPARISON:  01/19/2019 FINDINGS: Brain: No acute intracranial abnormality. Specifically,  no hemorrhage, hydrocephalus, mass lesion, acute infarction, or significant intracranial injury. Vascular: No hyperdense vessel or unexpected calcification. Skull: No acute calvarial abnormality. Sinuses/Orbits: Visualized paranasal sinuses and mastoids clear. Orbital soft tissues unremarkable. Other: None IMPRESSION: Normal study. Electronically Signed   By: Charlett Nose M.D.   On: 02/24/2020 20:46   CT Chest Wo Contrast  Result Date: 02/24/2020 CLINICAL DATA:  Chest pain EXAM: CT CHEST WITHOUT CONTRAST TECHNIQUE: Multidetector CT imaging of the chest was performed following the standard protocol without IV contrast. COMPARISON:  10/02/2018 FINDINGS: Cardiovascular: Left anterior descending coronary artery stenting has been performed. Global cardiac size within normal limits. Central pulmonary arteries are of normal caliber. Thoracic aorta is unremarkable. Mediastinum/Nodes: No pathologic thoracic adenopathy. Small hiatal hernia. Esophagus  unremarkable. Thyroid gland absent, possibly postsurgical or post ablated in nature. Lungs/Pleura: Multifocal peribronchial nodular infiltrates and scattered areas of pulmonary consolidation are identified most in keeping with atypical infection, more severe within the right lung. No pneumothorax or pleural effusion. Central airways are widely patent. Upper Abdomen: Unremarkable Musculoskeletal: Unremarkable IMPRESSION: Multifocal peribronchial nodular infiltrates and scattered areas of pulmonary consolidation are identified most in keeping with atypical infection, more severe within the right lung. The findings are not pathognomonic of COVID-19 pneumonia and additional organism should be considered. Electronically Signed   By: Helyn Numbers MD   On: 02/24/2020 20:50   DG CHEST PORT 1 VIEW  Result Date: 02/25/2020 CLINICAL DATA:  59 year old female with central line placement. EXAM: PORTABLE CHEST 1 VIEW COMPARISON:  Chest radiograph dated 02/24/2020. FINDINGS: Right IJ central venous line with tip over central SVC in similar position. Faint scattered bilateral pulmonary densities again noted which appears slightly improved over the left lung base compared to prior radiograph. No focal consolidation, pleural effusion, or pneumothorax. The cardiac silhouette is within limits. Coronary vascular calcifications noted. No acute osseous pathology. IMPRESSION: 1. Right IJ central venous line with tip over central SVC in similar position. 2. Slight interval improvement of faint pulmonary densities compared to prior radiograph. Electronically Signed   By: Elgie Collard M.D.   On: 02/25/2020 20:27   DG Chest Port 1 View  Result Date: 02/24/2020 CLINICAL DATA:  CVC placement EXAM: PORTABLE CHEST 1 VIEW COMPARISON:  02/24/2020, CT 02/24/2020 FINDINGS: Right-sided central venous catheter with tip projecting over the distal SVC. There is no pneumothorax identified. Vague bilateral foci of airspace disease  corresponding to CT areas of pneumonia. Mild cardiomegaly. IMPRESSION: Right-sided central venous catheter tip overlies the distal SVC. No pneumothorax. Mild cardiomegaly. Scattered foci of airspace disease corresponding to CT demonstrated pneumonia. Electronically Signed   By: Jasmine Pang M.D.   On: 02/24/2020 22:55   DG Chest Portable 1 View  Result Date: 02/24/2020 CLINICAL DATA:  Dyspnea EXAM: PORTABLE CHEST 1 VIEW COMPARISON:  None. FINDINGS: Triangular opacity within the right upper lung zone is new from prior examination and is indeterminate. This may represent pleural plaque, better visualized due to rotation of the patient on this examination, however, a focal pulmonary infiltrate or pulmonary nodule is difficult to exclude. Lungs are otherwise clear. No pneumothorax or pleural effusion. Cardiac size within normal limits. Remote left humeral neck fracture again noted. No acute bone abnormality. IMPRESSION: Triangular opacity within the right upper lung zone is new from prior examination and is indeterminate. This may represent pleural plaque, however, a focal pulmonary infiltrate or pulmonary nodule is difficult to exclude. This could be further assessed with dedicated two view chest radiograph or CT examination once the  patient's acute issues have resolved. Electronically Signed   By: Helyn NumbersAshesh  Parikh MD   On: 02/24/2020 21:13   ECHOCARDIOGRAM COMPLETE  Result Date: 02/27/2020    ECHOCARDIOGRAM REPORT   Patient Name:   Erin Lynch Date of Exam: 02/27/2020 Medical Rec #:  [id number]   Height:       63.0 in Accession #:    [id number]  Weight:       181.0 lb Date of Birth:  11/18/1960    BSA:          1.853 m Patient Age:    59 years    BP:           152/88 mmHg Patient Gender: F           HR:           62 bpm. Exam Location:  Inpatient Procedure: 2D Echo Indications:    bacteremia  History:        Patient has prior history of Echocardiogram examinations, most                 recent 02/01/2019. CHF, CAD;  Risk Factors:Hypertension,                 Dyslipidemia and Current Smoker.  Sonographer:    Celene SkeenVijay Shankar RDCS (AE) Referring Phys: [id number] CHI JANE ELLISON  Sonographer Comments: Image acquisition challenging due to respiratory motion and Image acquisition challenging due to patient body habitus. IMPRESSIONS  1. Left ventricular ejection fraction, by estimation, is 55 to 60%. The left ventricle has normal function. The left ventricle has no regional wall motion abnormalities. The left ventricular internal cavity size was mildly dilated. Left ventricular diastolic parameters were normal.  2. Right ventricular systolic function is normal. The right ventricular size is normal. There is moderately elevated pulmonary artery systolic pressure.  3. The mitral valve is normal in structure. Trivial mitral valve regurgitation. No evidence of mitral stenosis.  4. Tricuspid valve regurgitation is mild to moderate.  5. The aortic valve has an indeterminant number of cusps. Aortic valve regurgitation is mild to moderate.  6. The inferior vena cava is dilated in size with <50% respiratory variability, suggesting right atrial pressure of 15 mmHg. Conclusion(s)/Recommendation(s): No evidence of valvular vegetations on this transthoracic echocardiogram. Would recommend a transesophageal echocardiogram to exclude infective endocarditis if clinically indicated. FINDINGS  Left Ventricle: Left ventricular ejection fraction, by estimation, is 55 to 60%. The left ventricle has normal function. The left ventricle has no regional wall motion abnormalities. The left ventricular internal cavity size was mildly dilated. There is  borderline left ventricular hypertrophy. Left ventricular diastolic parameters were normal. Right Ventricle: The right ventricular size is normal. No increase in right ventricular wall thickness. Right ventricular systolic function is normal. There is moderately elevated pulmonary artery systolic pressure. The  tricuspid regurgitant velocity is 2.81 m/s, and with an assumed right atrial pressure of 15 mmHg, the estimated right ventricular systolic pressure is 46.6 mmHg. Left Atrium: Left atrial size was normal in size. Right Atrium: Right atrial size was normal in size. Pericardium: There is no evidence of pericardial effusion. Mitral Valve: The mitral valve is normal in structure. Trivial mitral valve regurgitation. No evidence of mitral valve stenosis. Tricuspid Valve: The tricuspid valve is normal in structure. Tricuspid valve regurgitation is mild to moderate. Aortic Valve: The aortic valve has an indeterminant number of cusps. . There is mild thickening and mild calcification of the aortic valve. Aortic valve regurgitation is mild  to moderate. There is mild thickening of the aortic valve. There is mild calcification of the aortic valve. Pulmonic Valve: The pulmonic valve was not well visualized. Pulmonic valve regurgitation is not visualized. Aorta: The ascending aorta was not well visualized and the aortic arch was not well visualized. Venous: The inferior vena cava is dilated in size with less than 50% respiratory variability, suggesting right atrial pressure of 15 mmHg. IAS/Shunts: No atrial level shunt detected by color flow Doppler.  LEFT VENTRICLE PLAX 2D LVIDd:         5.33 cm  Diastology LVIDs:         3.87 cm  LV e' lateral:   9.14 cm/s LV PW:         1.10 cm  LV E/e' lateral: 12.1 LV IVS:        1.10 cm  LV e' medial:    8.38 cm/s LVOT diam:     1.80 cm  LV E/e' medial:  13.2 LV SV:         67 LV SV Index:   36 LVOT Area:     2.54 cm  RIGHT VENTRICLE RV S prime:     9.36 cm/s TAPSE (M-mode): 2.6 cm LEFT ATRIUM           Index       RIGHT ATRIUM           Index LA diam:      3.60 cm 1.94 cm/m  RA Area:     13.70 cm LA Vol (A2C): 47.0 ml 25.36 ml/m RA Volume:   35.60 ml  19.21 ml/m LA Vol (A4C): 26.2 ml 14.14 ml/m  AORTIC VALVE LVOT Vmax:   109.00 cm/s LVOT Vmean:  78.000 cm/s LVOT VTI:    0.265 m  AORTA  Ao Root diam: 3.10 cm MITRAL VALVE                TRICUSPID VALVE MV Area (PHT): 4.80 cm     TR Peak grad:   31.6 mmHg MV Decel Time: 158 msec     TR Vmax:        281.00 cm/s MV E velocity: 111.00 cm/s MV A velocity: 100.00 cm/s  SHUNTS MV E/A ratio:  1.11         Systemic VTI:  0.26 m                             Systemic Diam: 1.80 cm Jodelle RedBridgette Christopher MD Electronically signed by Jodelle RedBridgette Christopher MD Signature Date/Time: 02/27/2020/3:54:13 PM    Final     Microbiology: Recent Results (from the past 240 hour(s))  Blood Culture (routine x 2)     Status: Abnormal   Collection Time: 02/24/20 12:21 AM   Specimen: BLOOD RIGHT ARM  Result Value Ref Range Status   Specimen Description BLOOD RIGHT ARM  Final   Special Requests   Final    BOTTLES DRAWN AEROBIC AND ANAEROBIC Blood Culture adequate volume   Culture  Setup Time   Final    GRAM POSITIVE COCCI IN CLUSTERS AEROBIC BOTTLE ONLY Organism ID to follow CRITICAL RESULT CALLED TO, READ BACK BY AND VERIFIED WITH: F,WILSON PHARMD @2009  02/25/20 EB    Culture (A)  Final    STAPHYLOCOCCUS SPECIES (COAGULASE NEGATIVE) THE SIGNIFICANCE OF ISOLATING THIS ORGANISM FROM A SINGLE SET OF BLOOD CULTURES WHEN MULTIPLE SETS ARE DRAWN IS UNCERTAIN. PLEASE NOTIFY THE MICROBIOLOGY DEPARTMENT WITHIN ONE WEEK  IF SPECIATION AND SENSITIVITIES ARE REQUIRED. Performed at [facility], [address] [place], [place] [address]    Report Status 02/26/2020 FINAL  Final  Blood Culture ID Panel (Reflexed)     Status: Abnormal   Collection Time: 02/24/20 12:21 AM  Result Value Ref Range Status   Enterococcus faecalis NOT DETECTED NOT DETECTED Final   Enterococcus Faecium NOT DETECTED NOT DETECTED Final   Listeria monocytogenes NOT DETECTED NOT DETECTED Final   Staphylococcus species DETECTED (A) NOT DETECTED Final    Comment: RESULT CALLED TO, READ BACK BY AND VERIFIED WITH: F,WILSON PHARMD @2009  02/25/20 EB    Staphylococcus aureus (BCID) NOT DETECTED  NOT DETECTED Final   Staphylococcus epidermidis NOT DETECTED NOT DETECTED Final   Staphylococcus lugdunensis NOT DETECTED NOT DETECTED Final   Streptococcus species NOT DETECTED NOT DETECTED Final   Streptococcus agalactiae NOT DETECTED NOT DETECTED Final   Streptococcus pneumoniae NOT DETECTED NOT DETECTED Final   Streptococcus pyogenes NOT DETECTED NOT DETECTED Final   A.calcoaceticus-baumannii NOT DETECTED NOT DETECTED Final   Bacteroides fragilis NOT DETECTED NOT DETECTED Final   Enterobacterales NOT DETECTED NOT DETECTED Final   Enterobacter cloacae complex NOT DETECTED NOT DETECTED Final   Escherichia coli NOT DETECTED NOT DETECTED Final   Klebsiella aerogenes NOT DETECTED NOT DETECTED Final   Klebsiella oxytoca NOT DETECTED NOT DETECTED Final   Klebsiella pneumoniae NOT DETECTED NOT DETECTED Final   Proteus species NOT DETECTED NOT DETECTED Final   Salmonella species NOT DETECTED NOT DETECTED Final   Serratia marcescens NOT DETECTED NOT DETECTED Final   Haemophilus influenzae NOT DETECTED NOT DETECTED Final   Neisseria meningitidis NOT DETECTED NOT DETECTED Final   Pseudomonas aeruginosa NOT DETECTED NOT DETECTED Final   Stenotrophomonas maltophilia NOT DETECTED NOT DETECTED Final   Candida albicans NOT DETECTED NOT DETECTED Final   Candida auris NOT DETECTED NOT DETECTED Final   Candida glabrata NOT DETECTED NOT DETECTED Final   Candida krusei NOT DETECTED NOT DETECTED Final   Candida parapsilosis NOT DETECTED NOT DETECTED Final   Candida tropicalis NOT DETECTED NOT DETECTED Final   Cryptococcus neoformans/gattii NOT DETECTED NOT DETECTED Final    Comment: Performed at [facility], [address], [place], [place]  SARS Coronavirus 2 by RT PCR (hospital order, performed in [facility] hospital lab) Nasopharyngeal Nasopharyngeal Swab     Status: None   Collection Time: 02/24/20  8:23 PM   Specimen: Nasopharyngeal Swab  Result Value Ref Range Status   SARS  Coronavirus 2 NEGATIVE NEGATIVE Final    Comment: (NOTE) SARS-CoV-2 target nucleic acids are NOT DETECTED.  The SARS-CoV-2 RNA is generally detectable in upper and lower respiratory specimens during the acute phase of infection. The lowest concentration of SARS-CoV-2 viral copies this assay can detect is 250 copies / mL. A negative result does not preclude SARS-CoV-2 infection and should not be used as the sole basis for treatment or other patient management decisions.  A negative result may occur with improper specimen collection / handling, submission of specimen other than nasopharyngeal swab, presence of viral mutation(s) within the areas targeted by this assay, and inadequate number of viral copies (<250 copies / mL). A negative result must be combined with clinical observations, patient history, and epidemiological information.  Fact Sheet for Patients:   04/25/20  Fact Sheet for Healthcare Providers: BoilerBrush.com.cy  This test is not yet approved or  cleared by the https://pope.com/ FDA and has been authorized for detection  and/or diagnosis of SARS-CoV-2 by FDA under an Emergency Use Authorization (EUA).  This EUA will remain in effect (meaning this test can be used) for the duration of the COVID-19 declaration under Section 564(b)(1) of the Act, 21 U.S.C. section 360bbb-3(b)(1), unless the authorization is terminated or revoked sooner.  Performed at [facility], [address] [place], [place] [address]   Blood Culture (routine x 2)     Status: None   Collection Time: 02/24/20 10:27 PM   Specimen: BLOOD  Result Value Ref Range Status   Specimen Description   Final    BLOOD LEFT ANTECUBITAL Performed at [facility], [address] [place], [place] [address]    Special Requests   Final    BOTTLES DRAWN AEROBIC AND ANAEROBIC Blood Culture adequate volume Performed at [facility], [address], [place], [place] [address]    Culture   Final    NO GROWTH 5 DAYS Performed at [facility], [address], [place], [place] [address]    Report Status 03/01/2020 FINAL  Final  Expectorated sputum assessment w rflx to resp cult     Status: None   Collection Time: 02/25/20  1:30 AM   Specimen: Expectorated Sputum  Result Value Ref Range Status   Specimen Description EXPECTORATED SPUTUM  Final   Special Requests NONE  Final   Sputum evaluation   Final    THIS SPECIMEN IS ACCEPTABLE FOR SPUTUM CULTURE Performed at [facility], [address] [place], [place] [address]    Report Status 02/25/2020 FINAL  Final  Culture, respiratory     Status: None   Collection Time: 02/25/20  1:30 AM  Result Value Ref Range Status   Specimen Description EXPECTORATED SPUTUM  Final   Special Requests NONE Reflexed from [id number]  Final   Gram Stain   Final    ABUNDANT WBC PRESENT, PREDOMINANTLY PMN MODERATE GRAM POSITIVE COCCI FEW GRAM NEGATIVE RODS Performed at [facility], [address] [place], [place] [address]    Culture MODERATE KLEBSIELLA PNEUMONIAE  Final   Report Status 02/27/2020 FINAL  Final   Organism ID, Bacteria KLEBSIELLA PNEUMONIAE  Final      Susceptibility   Klebsiella pneumoniae - MIC*    AMPICILLIN RESISTANT Resistant     CEFAZOLIN <=4 SENSITIVE Sensitive     CEFEPIME <=0.12 SENSITIVE Sensitive     CEFTAZIDIME <=1 SENSITIVE Sensitive     CEFTRIAXONE <=0.25 SENSITIVE Sensitive     CIPROFLOXACIN <=0.25 SENSITIVE Sensitive     GENTAMICIN <=1 SENSITIVE Sensitive     IMIPENEM <=0.25 SENSITIVE Sensitive     TRIMETH/SULFA <=20 SENSITIVE Sensitive     AMPICILLIN/SULBACTAM 4 SENSITIVE Sensitive     PIP/TAZO <=4 SENSITIVE Sensitive     * MODERATE KLEBSIELLA PNEUMONIAE  MRSA PCR Screening     Status: Abnormal   Collection Time: 02/25/20  2:21 AM   Specimen: Nasopharyngeal  Result Value Ref Range Status   MRSA by PCR POSITIVE (A) NEGATIVE Final    Comment:         The GeneXpert MRSA Assay (FDA approved for NASAL specimens only), is one component of a comprehensive MRSA colonization surveillance program. It is not intended to diagnose MRSA infection nor to guide or monitor treatment for MRSA infections. RESULT CALLED TO, READ BACK BY AND VERIFIED WITH: Baldo Ash 02/25/20 0500 JDW Performed at [facility], [address], [place], [place] [address]  Respiratory Panel by PCR     Status: None   Collection Time: 02/26/20  3:02 PM   Specimen: Nasopharyngeal Swab; Respiratory  Result Value Ref Range Status   Adenovirus NOT DETECTED NOT DETECTED Final   Coronavirus 229E NOT DETECTED NOT DETECTED Final    Comment: (NOTE) The Coronavirus on the Respiratory Panel, DOES NOT test for the novel  Coronavirus (2019 nCoV)    Coronavirus HKU1 NOT DETECTED NOT DETECTED Final   Coronavirus NL63 NOT DETECTED NOT DETECTED Final   Coronavirus OC43 NOT DETECTED NOT DETECTED Final   Metapneumovirus NOT DETECTED NOT DETECTED Final   Rhinovirus / Enterovirus NOT DETECTED NOT DETECTED Final   Influenza A NOT DETECTED NOT DETECTED Final   Influenza B NOT DETECTED NOT DETECTED Final   Parainfluenza Virus 1 NOT DETECTED NOT DETECTED Final   Parainfluenza Virus 2 NOT DETECTED NOT DETECTED Final   Parainfluenza Virus 3 NOT DETECTED NOT DETECTED Final   Parainfluenza Virus 4 NOT DETECTED NOT DETECTED Final   Respiratory Syncytial Virus NOT DETECTED NOT DETECTED Final   Bordetella pertussis NOT DETECTED NOT DETECTED Final   Chlamydophila pneumoniae NOT DETECTED NOT DETECTED Final   Mycoplasma pneumoniae NOT DETECTED NOT DETECTED Final    Comment: Performed at [facility], [address] [place], [place] [address]  Culture, blood (routine x 2)     Status: None (Preliminary result)   Collection Time: 02/27/20  5:09 AM   Specimen: BLOOD  Result Value Ref Range Status   Specimen Description BLOOD RIGHT ARM  Final   Special Requests   Final    BOTTLES  DRAWN AEROBIC ONLY Blood Culture adequate volume   Culture   Final    NO GROWTH 4 DAYS Performed at [facility], [address], [place], [place] [address]    Report Status PENDING  Incomplete  Culture, blood (routine x 2)     Status: None (Preliminary result)   Collection Time: 02/27/20  5:15 AM   Specimen: BLOOD  Result Value Ref Range Status   Specimen Description BLOOD LEFT ARM  Final   Special Requests   Final    BOTTLES DRAWN AEROBIC AND ANAEROBIC Blood Culture adequate volume   Culture   Final    NO GROWTH 4 DAYS Performed at [facility], [address] [place], [place] [address]    Report Status PENDING  Incomplete     Labs: Basic Metabolic Panel: Recent Labs  Lab 02/26/20 0402 02/27/20 0336 02/28/20 0319 02/29/20 1140 03/01/20 0414  NA 139 138 142 144 142  K 3.3* 3.9 3.8 3.4* 3.4*  CL 106 109 109 112* 113*  CO2 23 21* GLUCOSE 163* 110* 89 90 97  BUN CREATININE 1.52* 0.89 0.87 0.79 0.76  CALCIUM 8.3* 8.3* 8.8* 8.7* 8.0*  MG  --  1.7  --  2.2  --    Liver Function Tests: Recent Labs  Lab 02/24/20 2219 02/29/20 1140  AST 18 16  ALT 13 16  ALKPHOS 86 70  BILITOT 0.5 0.5  PROT 5.1* 4.9*  ALBUMIN 2.5* 2.5*   No results for input(s): LIPASE, AMYLASE in the last 168 hours. No results for input(s): AMMONIA in the last 168 hours. CBC: Recent Labs  Lab 02/24/20 2330 02/25/20 0240 02/27/20 0336 02/29/20 1140 03/01/20 0414  WBC 38.7* 40.8* 13.6* 6.7 6.0  NEUTROABS 33.2*  --   --   --   --  HGB 9.3* 10.1* 8.1* 8.5* 8.0*  HCT 30.3* 32.1* 26.3* 27.7* 26.7*  MCV 90.7 88.2 89.8 90.2 91.4  PLT 355 363 217 243 245   Cardiac Enzymes: No results for input(s): CKTOTAL, CKMB, CKMBINDEX, TROPONINI in the last 168 hours. BNP: BNP (last 3 results) Recent Labs    02/24/20 2330  BNP 201.2*    ProBNP (last 3 results) No results for input(s): PROBNP in the last 8760 hours.  CBG: Recent Labs  Lab 02/26/20 1622  02/27/20 1932  GLUCAP 130* 92       Signed:  Zannie Cove MD.  [facility] 03/02/2020, 3:07 PM

## 2020-03-02 NOTE — Progress Notes (Signed)
DISCHARGE NOTE HOME Erin Lynch to be discharged Home per MD order. Discussed prescriptions and follow up appointments with the patient. Prescriptions given to patient; medication list explained in detail. Patient verbalized understanding.  Skin clean, dry and intact without evidence of skin break down, no evidence of skin tears noted. IV catheter discontinued intact. Site without signs and symptoms of complications. Dressing and pressure applied. Pt denies pain at the site currently. No complaints noted.  Patient free of lines, drains, and wounds.   An After Visit Summary (AVS) was printed and given to the patient. Patient escorted via wheelchair, and discharged home via private auto.  [name] BSN, RN3

## 2020-03-02 NOTE — Progress Notes (Signed)
Physical Therapy Treatment Patient Details Name: Erin Lynch MRN: [id number] DOB: Jan 03, 1961 Today's Date: 03/02/2020    History of Present Illness Erin Lynch is a 59 y/o woman with a history of COPD and tobacco abuse who has been feeling ill for about a week prior to admission. She had been feeling better but started feeling worse again. She fell out of a chair 8/12 and hit her head. Upon arrival to the ED she was hypoxic and hypotensive; hospital course complicated by confusion and agitation    PT Comments    The pt is progressing well and demonstrates significant improvement from prior sessions today due to continued clearing of cognition and awareness. She was able to complete hallway ambulation without use of AD and only minG for safety as well as navigate 2 steps x2 with use of 1 rail and minG for safety. The pt showed good safety awareness with mobility today, and voiced that her sister-in-law could assist and supervise at home through the day as needed while the pt's husband is at work. The pt will continue to benefit from skilled PT to progress functional endurance, strength, and stability to facilitate return to independence and reduce risk of falls at home.     Follow Up Recommendations  Home health PT     Equipment Recommendations  None recommended by PT (pt with RW at home if needed)    Recommendations for Other Services       Precautions / Restrictions Precautions Precautions: Fall Precaution Comments: pt cognition and impulsivity markedly improved, no longer in restraints Restrictions Weight Bearing Restrictions: No    Mobility  Bed Mobility Overal bed mobility: Needs Assistance             General bed mobility comments: pt up in recliner at start and end of session  Transfers Overall transfer level: Needs assistance Equipment used: None Transfers: Sit to/from Stand Sit to Stand: Min guard         General transfer comment: minG for safety, no assist  needed to power up or adjust in recliner  Ambulation/Gait Ambulation/Gait assistance: Min guard Gait Distance (Feet): 200 Feet Assistive device: None Gait Pattern/deviations: Step-through pattern Gait velocity: 0.4 m/s Gait velocity interpretation: <1.31 ft/sec, indicative of household ambulator General Gait Details: pt able to ambulate without AD with minG for safety. no LOB. slightly widened BOS   Stairs Stairs: Yes Stairs assistance: Min guard Stair Management: One rail Right;Alternating pattern;Forwards Number of Stairs: 2 (x2) General stair comments: The pt completed 2 steps with alternating pattern and use of 1 rail twice, no LOB despite pt verbalizing increased concern with stair navigation. reports she will teach her husband how to assist on their steps at home         Balance Overall balance assessment: Needs assistance Sitting-balance support: No upper extremity supported;Feet supported Sitting balance-Leahy Scale: Fair       Standing balance-Leahy Scale: Fair Standing balance comment: able to ambulate without UE support                            Cognition Arousal/Alertness: Awake/alert Behavior During Therapy: WFL for tasks assessed/performed Overall Cognitive Status: Within Functional Limits for tasks assessed                                 General Comments: Pt agreeable and cooperative through session. all responses and questions were  appropriate at this session. Pt with good insight for safety today      Exercises      General Comments General comments (skin integrity, edema, etc.): Pt cognition significantly improved, recounting things she said or experienced while hallucinating and is still surprised by how "out of it" she was. Pt with improved understanding and safety awareness today      Pertinent Vitals/Pain Pain Assessment: No/denies pain Pain Intervention(s): Monitored during session           PT Goals (current  goals can now be found in the care plan section) Acute Rehab PT Goals Patient Stated Goal: home  PT Goal Formulation: With patient Time For Goal Achievement: 03/13/20 Potential to Achieve Goals: Good Progress towards PT goals: Progressing toward goals    Frequency    Min 3X/week      PT Plan Discharge plan needs to be updated       AM-PAC PT "6 Clicks" Mobility   Outcome Measure  Help needed turning from your back to your side while in a flat bed without using bedrails?: None Help needed moving from lying on your back to sitting on the side of a flat bed without using bedrails?: None Help needed moving to and from a bed to a chair (including a wheelchair)?: A Little Help needed standing up from a chair using your arms (e.g., wheelchair or bedside chair)?: A Little Help needed to walk in hospital room?: A Little Help needed climbing 3-5 steps with a railing? : A Little 6 Click Score: 20    End of Session Equipment Utilized During Treatment: Gait belt Activity Tolerance: Patient tolerated treatment well Patient left: in bed;with call bell/phone within reach;with bed alarm set;with restraints reapplied Nurse Communication: Mobility status PT Visit Diagnosis: Unsteadiness on feet (R26.81);Other abnormalities of gait and mobility (R26.89);Other symptoms and signs involving the nervous system (R29.898)     Time: 8295-6213 PT Time Calculation (min) (ACUTE ONLY): 19 min  Charges:  $Gait Training: 8-22 mins                     Erin Lynch, PT, DPT   Acute Rehabilitation Department Pager #: [phone]   Erin Lynch 03/02/2020, 12:47 PM

## 2020-03-03 LAB — CULTURE, BLOOD (ROUTINE X 2)
Culture: NO GROWTH
Culture: NO GROWTH
Special Requests: ADEQUATE
Special Requests: ADEQUATE

## 2020-03-06 DIAGNOSIS — I2510 Atherosclerotic heart disease of native coronary artery without angina pectoris: Secondary | ICD-10-CM | POA: Diagnosis not present

## 2020-03-06 DIAGNOSIS — E785 Hyperlipidemia, unspecified: Secondary | ICD-10-CM | POA: Diagnosis not present

## 2020-03-06 DIAGNOSIS — I110 Hypertensive heart disease with heart failure: Secondary | ICD-10-CM | POA: Diagnosis not present

## 2020-03-06 DIAGNOSIS — R069 Unspecified abnormalities of breathing: Secondary | ICD-10-CM | POA: Diagnosis not present

## 2020-03-06 DIAGNOSIS — J449 Chronic obstructive pulmonary disease, unspecified: Secondary | ICD-10-CM | POA: Diagnosis not present

## 2020-03-06 DIAGNOSIS — Z79890 Hormone replacement therapy (postmenopausal): Secondary | ICD-10-CM | POA: Diagnosis not present

## 2020-03-06 DIAGNOSIS — J811 Chronic pulmonary edema: Secondary | ICD-10-CM | POA: Diagnosis not present

## 2020-03-06 DIAGNOSIS — I509 Heart failure, unspecified: Secondary | ICD-10-CM | POA: Diagnosis not present

## 2020-03-06 DIAGNOSIS — R0600 Dyspnea, unspecified: Secondary | ICD-10-CM | POA: Diagnosis not present

## 2020-03-06 DIAGNOSIS — J189 Pneumonia, unspecified organism: Secondary | ICD-10-CM | POA: Diagnosis not present

## 2020-03-06 DIAGNOSIS — I517 Cardiomegaly: Secondary | ICD-10-CM | POA: Diagnosis not present

## 2020-03-06 DIAGNOSIS — E039 Hypothyroidism, unspecified: Secondary | ICD-10-CM | POA: Diagnosis not present

## 2020-03-06 DIAGNOSIS — R0902 Hypoxemia: Secondary | ICD-10-CM | POA: Diagnosis not present

## 2020-03-06 DIAGNOSIS — R0602 Shortness of breath: Secondary | ICD-10-CM | POA: Diagnosis not present

## 2020-03-06 DIAGNOSIS — Z87891 Personal history of nicotine dependence: Secondary | ICD-10-CM | POA: Diagnosis not present

## 2020-03-06 DIAGNOSIS — E7800 Pure hypercholesterolemia, unspecified: Secondary | ICD-10-CM | POA: Diagnosis not present

## 2020-03-09 DIAGNOSIS — A403 Sepsis due to Streptococcus pneumoniae: Secondary | ICD-10-CM | POA: Diagnosis not present

## 2020-03-09 DIAGNOSIS — I2510 Atherosclerotic heart disease of native coronary artery without angina pectoris: Secondary | ICD-10-CM | POA: Diagnosis not present

## 2020-03-09 DIAGNOSIS — E785 Hyperlipidemia, unspecified: Secondary | ICD-10-CM | POA: Diagnosis not present

## 2020-03-09 DIAGNOSIS — J440 Chronic obstructive pulmonary disease with acute lower respiratory infection: Secondary | ICD-10-CM | POA: Diagnosis not present

## 2020-03-09 DIAGNOSIS — I110 Hypertensive heart disease with heart failure: Secondary | ICD-10-CM | POA: Diagnosis not present

## 2020-03-09 DIAGNOSIS — I5032 Chronic diastolic (congestive) heart failure: Secondary | ICD-10-CM | POA: Diagnosis not present

## 2020-03-09 DIAGNOSIS — R69 Illness, unspecified: Secondary | ICD-10-CM | POA: Diagnosis not present

## 2020-03-09 DIAGNOSIS — J9601 Acute respiratory failure with hypoxia: Secondary | ICD-10-CM | POA: Diagnosis not present

## 2020-03-09 DIAGNOSIS — J441 Chronic obstructive pulmonary disease with (acute) exacerbation: Secondary | ICD-10-CM | POA: Diagnosis not present

## 2020-03-13 DIAGNOSIS — I2510 Atherosclerotic heart disease of native coronary artery without angina pectoris: Secondary | ICD-10-CM | POA: Diagnosis not present

## 2020-03-13 DIAGNOSIS — R69 Illness, unspecified: Secondary | ICD-10-CM | POA: Diagnosis not present

## 2020-03-13 DIAGNOSIS — I5032 Chronic diastolic (congestive) heart failure: Secondary | ICD-10-CM | POA: Diagnosis not present

## 2020-03-13 DIAGNOSIS — R011 Cardiac murmur, unspecified: Secondary | ICD-10-CM | POA: Diagnosis not present

## 2020-03-13 DIAGNOSIS — J441 Chronic obstructive pulmonary disease with (acute) exacerbation: Secondary | ICD-10-CM | POA: Diagnosis not present

## 2020-03-13 DIAGNOSIS — E785 Hyperlipidemia, unspecified: Secondary | ICD-10-CM | POA: Diagnosis not present

## 2020-03-13 DIAGNOSIS — J9601 Acute respiratory failure with hypoxia: Secondary | ICD-10-CM | POA: Diagnosis not present

## 2020-03-13 DIAGNOSIS — J440 Chronic obstructive pulmonary disease with acute lower respiratory infection: Secondary | ICD-10-CM | POA: Diagnosis not present

## 2020-03-13 DIAGNOSIS — A403 Sepsis due to Streptococcus pneumoniae: Secondary | ICD-10-CM | POA: Diagnosis not present

## 2020-03-13 DIAGNOSIS — I110 Hypertensive heart disease with heart failure: Secondary | ICD-10-CM | POA: Diagnosis not present

## 2020-03-13 DIAGNOSIS — R0602 Shortness of breath: Secondary | ICD-10-CM | POA: Diagnosis not present

## 2020-03-13 DIAGNOSIS — J180 Bronchopneumonia, unspecified organism: Secondary | ICD-10-CM | POA: Diagnosis not present

## 2020-03-13 DIAGNOSIS — I10 Essential (primary) hypertension: Secondary | ICD-10-CM | POA: Diagnosis not present

## 2020-03-19 DIAGNOSIS — I110 Hypertensive heart disease with heart failure: Secondary | ICD-10-CM | POA: Diagnosis not present

## 2020-03-19 DIAGNOSIS — J441 Chronic obstructive pulmonary disease with (acute) exacerbation: Secondary | ICD-10-CM | POA: Diagnosis not present

## 2020-03-19 DIAGNOSIS — J189 Pneumonia, unspecified organism: Secondary | ICD-10-CM | POA: Diagnosis not present

## 2020-03-19 DIAGNOSIS — A419 Sepsis, unspecified organism: Secondary | ICD-10-CM | POA: Diagnosis not present

## 2020-03-19 DIAGNOSIS — I517 Cardiomegaly: Secondary | ICD-10-CM | POA: Diagnosis not present

## 2020-03-19 DIAGNOSIS — R6521 Severe sepsis with septic shock: Secondary | ICD-10-CM | POA: Diagnosis not present

## 2020-03-19 DIAGNOSIS — G8929 Other chronic pain: Secondary | ICD-10-CM | POA: Diagnosis not present

## 2020-03-19 DIAGNOSIS — E039 Hypothyroidism, unspecified: Secondary | ICD-10-CM | POA: Diagnosis not present

## 2020-03-19 DIAGNOSIS — J9601 Acute respiratory failure with hypoxia: Secondary | ICD-10-CM | POA: Diagnosis not present

## 2020-03-19 DIAGNOSIS — E876 Hypokalemia: Secondary | ICD-10-CM | POA: Diagnosis not present

## 2020-03-19 DIAGNOSIS — Z20822 Contact with and (suspected) exposure to covid-19: Secondary | ICD-10-CM | POA: Diagnosis not present

## 2020-03-19 DIAGNOSIS — R69 Illness, unspecified: Secondary | ICD-10-CM | POA: Diagnosis not present

## 2020-03-19 DIAGNOSIS — J440 Chronic obstructive pulmonary disease with acute lower respiratory infection: Secondary | ICD-10-CM | POA: Diagnosis not present

## 2020-03-19 DIAGNOSIS — Y95 Nosocomial condition: Secondary | ICD-10-CM | POA: Diagnosis not present

## 2020-03-19 DIAGNOSIS — R197 Diarrhea, unspecified: Secondary | ICD-10-CM | POA: Diagnosis not present

## 2020-03-19 DIAGNOSIS — N179 Acute kidney failure, unspecified: Secondary | ICD-10-CM | POA: Diagnosis not present

## 2020-03-19 DIAGNOSIS — E785 Hyperlipidemia, unspecified: Secondary | ICD-10-CM | POA: Diagnosis not present

## 2020-03-19 DIAGNOSIS — J449 Chronic obstructive pulmonary disease, unspecified: Secondary | ICD-10-CM | POA: Diagnosis not present

## 2020-03-19 DIAGNOSIS — I5030 Unspecified diastolic (congestive) heart failure: Secondary | ICD-10-CM | POA: Diagnosis not present

## 2020-03-19 DIAGNOSIS — I2510 Atherosclerotic heart disease of native coronary artery without angina pectoris: Secondary | ICD-10-CM | POA: Diagnosis not present

## 2020-03-19 DIAGNOSIS — A403 Sepsis due to Streptococcus pneumoniae: Secondary | ICD-10-CM | POA: Diagnosis not present

## 2020-03-19 DIAGNOSIS — I5032 Chronic diastolic (congestive) heart failure: Secondary | ICD-10-CM | POA: Diagnosis not present

## 2020-03-21 DIAGNOSIS — I2510 Atherosclerotic heart disease of native coronary artery without angina pectoris: Secondary | ICD-10-CM | POA: Diagnosis not present

## 2020-03-21 DIAGNOSIS — I110 Hypertensive heart disease with heart failure: Secondary | ICD-10-CM | POA: Diagnosis not present

## 2020-03-21 DIAGNOSIS — J441 Chronic obstructive pulmonary disease with (acute) exacerbation: Secondary | ICD-10-CM | POA: Diagnosis not present

## 2020-03-21 DIAGNOSIS — J9601 Acute respiratory failure with hypoxia: Secondary | ICD-10-CM | POA: Diagnosis not present

## 2020-03-21 DIAGNOSIS — A403 Sepsis due to Streptococcus pneumoniae: Secondary | ICD-10-CM | POA: Diagnosis not present

## 2020-03-21 DIAGNOSIS — J440 Chronic obstructive pulmonary disease with acute lower respiratory infection: Secondary | ICD-10-CM | POA: Diagnosis not present

## 2020-03-22 DIAGNOSIS — A419 Sepsis, unspecified organism: Secondary | ICD-10-CM | POA: Diagnosis not present

## 2020-03-22 DIAGNOSIS — J189 Pneumonia, unspecified organism: Secondary | ICD-10-CM | POA: Diagnosis not present

## 2020-03-26 DIAGNOSIS — I2510 Atherosclerotic heart disease of native coronary artery without angina pectoris: Secondary | ICD-10-CM | POA: Diagnosis not present

## 2020-03-26 DIAGNOSIS — I5030 Unspecified diastolic (congestive) heart failure: Secondary | ICD-10-CM | POA: Diagnosis not present

## 2020-03-26 DIAGNOSIS — R0602 Shortness of breath: Secondary | ICD-10-CM | POA: Diagnosis not present

## 2020-03-26 DIAGNOSIS — R062 Wheezing: Secondary | ICD-10-CM | POA: Diagnosis not present

## 2020-03-26 DIAGNOSIS — E039 Hypothyroidism, unspecified: Secondary | ICD-10-CM | POA: Diagnosis not present

## 2020-03-26 DIAGNOSIS — R0902 Hypoxemia: Secondary | ICD-10-CM | POA: Diagnosis not present

## 2020-03-26 DIAGNOSIS — J440 Chronic obstructive pulmonary disease with acute lower respiratory infection: Secondary | ICD-10-CM | POA: Diagnosis not present

## 2020-03-26 DIAGNOSIS — D649 Anemia, unspecified: Secondary | ICD-10-CM | POA: Diagnosis not present

## 2020-03-26 DIAGNOSIS — R69 Illness, unspecified: Secondary | ICD-10-CM | POA: Diagnosis not present

## 2020-03-26 DIAGNOSIS — G8929 Other chronic pain: Secondary | ICD-10-CM | POA: Diagnosis not present

## 2020-03-26 DIAGNOSIS — A419 Sepsis, unspecified organism: Secondary | ICD-10-CM | POA: Diagnosis not present

## 2020-03-26 DIAGNOSIS — E785 Hyperlipidemia, unspecified: Secondary | ICD-10-CM | POA: Diagnosis not present

## 2020-03-26 DIAGNOSIS — Z20822 Contact with and (suspected) exposure to covid-19: Secondary | ICD-10-CM | POA: Diagnosis not present

## 2020-03-26 DIAGNOSIS — I5032 Chronic diastolic (congestive) heart failure: Secondary | ICD-10-CM | POA: Diagnosis not present

## 2020-03-26 DIAGNOSIS — Y95 Nosocomial condition: Secondary | ICD-10-CM | POA: Diagnosis not present

## 2020-03-26 DIAGNOSIS — J189 Pneumonia, unspecified organism: Secondary | ICD-10-CM | POA: Diagnosis not present

## 2020-03-26 DIAGNOSIS — R197 Diarrhea, unspecified: Secondary | ICD-10-CM | POA: Diagnosis not present

## 2020-03-26 DIAGNOSIS — I959 Hypotension, unspecified: Secondary | ICD-10-CM | POA: Diagnosis not present

## 2020-03-26 DIAGNOSIS — J449 Chronic obstructive pulmonary disease, unspecified: Secondary | ICD-10-CM | POA: Diagnosis not present

## 2020-03-26 DIAGNOSIS — I110 Hypertensive heart disease with heart failure: Secondary | ICD-10-CM | POA: Diagnosis not present

## 2020-03-26 DIAGNOSIS — E875 Hyperkalemia: Secondary | ICD-10-CM | POA: Diagnosis not present

## 2020-03-29 DIAGNOSIS — I2510 Atherosclerotic heart disease of native coronary artery without angina pectoris: Secondary | ICD-10-CM | POA: Diagnosis not present

## 2020-03-29 DIAGNOSIS — I110 Hypertensive heart disease with heart failure: Secondary | ICD-10-CM | POA: Diagnosis not present

## 2020-03-29 DIAGNOSIS — R69 Illness, unspecified: Secondary | ICD-10-CM | POA: Diagnosis not present

## 2020-03-29 DIAGNOSIS — J189 Pneumonia, unspecified organism: Secondary | ICD-10-CM | POA: Diagnosis not present

## 2020-03-29 DIAGNOSIS — E782 Mixed hyperlipidemia: Secondary | ICD-10-CM | POA: Diagnosis not present

## 2020-03-29 DIAGNOSIS — I509 Heart failure, unspecified: Secondary | ICD-10-CM | POA: Diagnosis not present

## 2020-03-29 DIAGNOSIS — A419 Sepsis, unspecified organism: Secondary | ICD-10-CM | POA: Diagnosis not present

## 2020-03-29 DIAGNOSIS — E669 Obesity, unspecified: Secondary | ICD-10-CM | POA: Diagnosis not present

## 2020-03-29 DIAGNOSIS — E039 Hypothyroidism, unspecified: Secondary | ICD-10-CM | POA: Diagnosis not present

## 2020-04-06 DIAGNOSIS — J449 Chronic obstructive pulmonary disease, unspecified: Secondary | ICD-10-CM | POA: Diagnosis not present

## 2020-05-23 ENCOUNTER — Encounter: Payer: Self-pay | Admitting: *Deleted

## 2020-05-23 NOTE — Progress Notes (Signed)
Patient ID: Erin Lynch, female   DOB: 12/23/1960, 59 y.o.   MRN: [id number]     Patient was admitted to [facility]'s home-based palliative care program on 05/04/20.  Nurse will visit patient 1-3 times per month, with social work support as well.  Ellard Artis Referral Specialist [phone]

## 2020-08-11 ENCOUNTER — Inpatient Hospital Stay (HOSPITAL_COMMUNITY): Payer: Medicare (Managed Care)

## 2020-08-11 ENCOUNTER — Emergency Department (HOSPITAL_COMMUNITY): Payer: Medicare (Managed Care)

## 2020-08-11 ENCOUNTER — Other Ambulatory Visit: Payer: Self-pay

## 2020-08-11 ENCOUNTER — Encounter (HOSPITAL_COMMUNITY): Payer: Self-pay

## 2020-08-11 ENCOUNTER — Inpatient Hospital Stay (HOSPITAL_COMMUNITY)
Admission: EM | Admit: 2020-08-11 | Discharge: 2020-08-14 | DRG: 871 | Disposition: A | Discharge status: Home Health | Payer: Medicare (Managed Care) | Attending: Internal Medicine | Admitting: Internal Medicine

## 2020-08-11 DIAGNOSIS — F17210 Nicotine dependence, cigarettes, uncomplicated: Secondary | ICD-10-CM | POA: Diagnosis present

## 2020-08-11 DIAGNOSIS — Z823 Family history of stroke: Secondary | ICD-10-CM

## 2020-08-11 DIAGNOSIS — I2510 Atherosclerotic heart disease of native coronary artery without angina pectoris: Secondary | ICD-10-CM | POA: Diagnosis present

## 2020-08-11 DIAGNOSIS — R4781 Slurred speech: Secondary | ICD-10-CM

## 2020-08-11 DIAGNOSIS — K449 Diaphragmatic hernia without obstruction or gangrene: Secondary | ICD-10-CM | POA: Diagnosis present

## 2020-08-11 DIAGNOSIS — I252 Old myocardial infarction: Secondary | ICD-10-CM

## 2020-08-11 DIAGNOSIS — E861 Hypovolemia: Secondary | ICD-10-CM | POA: Diagnosis present

## 2020-08-11 DIAGNOSIS — K222 Esophageal obstruction: Secondary | ICD-10-CM | POA: Diagnosis present

## 2020-08-11 DIAGNOSIS — Z955 Presence of coronary angioplasty implant and graft: Secondary | ICD-10-CM

## 2020-08-11 DIAGNOSIS — F319 Bipolar disorder, unspecified: Secondary | ICD-10-CM | POA: Diagnosis present

## 2020-08-11 DIAGNOSIS — M1990 Unspecified osteoarthritis, unspecified site: Secondary | ICD-10-CM | POA: Diagnosis present

## 2020-08-11 DIAGNOSIS — I5032 Chronic diastolic (congestive) heart failure: Secondary | ICD-10-CM | POA: Diagnosis present

## 2020-08-11 DIAGNOSIS — Z20822 Contact with and (suspected) exposure to covid-19: Secondary | ICD-10-CM | POA: Diagnosis present

## 2020-08-11 DIAGNOSIS — E039 Hypothyroidism, unspecified: Secondary | ICD-10-CM | POA: Diagnosis present

## 2020-08-11 DIAGNOSIS — R0902 Hypoxemia: Secondary | ICD-10-CM

## 2020-08-11 DIAGNOSIS — E785 Hyperlipidemia, unspecified: Secondary | ICD-10-CM | POA: Diagnosis present

## 2020-08-11 DIAGNOSIS — I959 Hypotension, unspecified: Secondary | ICD-10-CM | POA: Diagnosis not present

## 2020-08-11 DIAGNOSIS — Z79890 Hormone replacement therapy (postmenopausal): Secondary | ICD-10-CM

## 2020-08-11 DIAGNOSIS — M797 Fibromyalgia: Secondary | ICD-10-CM | POA: Diagnosis present

## 2020-08-11 DIAGNOSIS — D649 Anemia, unspecified: Secondary | ICD-10-CM | POA: Diagnosis present

## 2020-08-11 DIAGNOSIS — R471 Dysarthria and anarthria: Secondary | ICD-10-CM | POA: Diagnosis present

## 2020-08-11 DIAGNOSIS — W06XXXA Fall from bed, initial encounter: Secondary | ICD-10-CM | POA: Diagnosis present

## 2020-08-11 DIAGNOSIS — I5030 Unspecified diastolic (congestive) heart failure: Secondary | ICD-10-CM | POA: Diagnosis not present

## 2020-08-11 DIAGNOSIS — J168 Pneumonia due to other specified infectious organisms: Secondary | ICD-10-CM | POA: Diagnosis not present

## 2020-08-11 DIAGNOSIS — Z66 Do not resuscitate: Secondary | ICD-10-CM | POA: Diagnosis present

## 2020-08-11 DIAGNOSIS — F1120 Opioid dependence, uncomplicated: Secondary | ICD-10-CM | POA: Diagnosis present

## 2020-08-11 DIAGNOSIS — A419 Sepsis, unspecified organism: Secondary | ICD-10-CM | POA: Diagnosis present

## 2020-08-11 DIAGNOSIS — R1314 Dysphagia, pharyngoesophageal phase: Secondary | ICD-10-CM | POA: Diagnosis present

## 2020-08-11 DIAGNOSIS — R571 Hypovolemic shock: Secondary | ICD-10-CM | POA: Diagnosis present

## 2020-08-11 DIAGNOSIS — J690 Pneumonitis due to inhalation of food and vomit: Secondary | ICD-10-CM | POA: Diagnosis present

## 2020-08-11 DIAGNOSIS — J129 Viral pneumonia, unspecified: Secondary | ICD-10-CM | POA: Diagnosis not present

## 2020-08-11 DIAGNOSIS — G8929 Other chronic pain: Secondary | ICD-10-CM | POA: Diagnosis present

## 2020-08-11 DIAGNOSIS — R1310 Dysphagia, unspecified: Secondary | ICD-10-CM

## 2020-08-11 DIAGNOSIS — I110 Hypertensive heart disease with heart failure: Secondary | ICD-10-CM | POA: Diagnosis present

## 2020-08-11 DIAGNOSIS — N179 Acute kidney failure, unspecified: Secondary | ICD-10-CM

## 2020-08-11 DIAGNOSIS — M795 Residual foreign body in soft tissue: Secondary | ICD-10-CM

## 2020-08-11 DIAGNOSIS — R296 Repeated falls: Secondary | ICD-10-CM | POA: Diagnosis present

## 2020-08-11 DIAGNOSIS — Z6831 Body mass index (BMI) 31.0-31.9, adult: Secondary | ICD-10-CM

## 2020-08-11 DIAGNOSIS — G9341 Metabolic encephalopathy: Secondary | ICD-10-CM | POA: Diagnosis present

## 2020-08-11 DIAGNOSIS — R300 Dysuria: Secondary | ICD-10-CM | POA: Diagnosis present

## 2020-08-11 DIAGNOSIS — Q394 Esophageal web: Secondary | ICD-10-CM | POA: Diagnosis not present

## 2020-08-11 DIAGNOSIS — F419 Anxiety disorder, unspecified: Secondary | ICD-10-CM | POA: Diagnosis present

## 2020-08-11 DIAGNOSIS — Z8719 Personal history of other diseases of the digestive system: Secondary | ICD-10-CM | POA: Diagnosis not present

## 2020-08-11 DIAGNOSIS — E860 Dehydration: Secondary | ICD-10-CM | POA: Diagnosis present

## 2020-08-11 DIAGNOSIS — E669 Obesity, unspecified: Secondary | ICD-10-CM | POA: Diagnosis present

## 2020-08-11 DIAGNOSIS — R1319 Other dysphagia: Secondary | ICD-10-CM | POA: Diagnosis not present

## 2020-08-11 DIAGNOSIS — R531 Weakness: Secondary | ICD-10-CM | POA: Diagnosis present

## 2020-08-11 DIAGNOSIS — Z833 Family history of diabetes mellitus: Secondary | ICD-10-CM

## 2020-08-11 DIAGNOSIS — Z79899 Other long term (current) drug therapy: Secondary | ICD-10-CM

## 2020-08-11 DIAGNOSIS — K219 Gastro-esophageal reflux disease without esophagitis: Secondary | ICD-10-CM | POA: Diagnosis present

## 2020-08-11 DIAGNOSIS — Z8249 Family history of ischemic heart disease and other diseases of the circulatory system: Secondary | ICD-10-CM

## 2020-08-11 DIAGNOSIS — Z9889 Other specified postprocedural states: Secondary | ICD-10-CM | POA: Diagnosis not present

## 2020-08-11 DIAGNOSIS — Z7982 Long term (current) use of aspirin: Secondary | ICD-10-CM

## 2020-08-11 DIAGNOSIS — Z7902 Long term (current) use of antithrombotics/antiplatelets: Secondary | ICD-10-CM

## 2020-08-11 LAB — I-STAT ARTERIAL BLOOD GAS, ED
Acid-Base Excess: 3 mmol/L — ABNORMAL HIGH (ref 0.0–2.0)
Acid-Base Excess: 0 mmol/L (ref 0.0–2.0)
Bicarbonate: 28.5 mmol/L — ABNORMAL HIGH (ref 20.0–28.0)
Bicarbonate: 26.6 mmol/L (ref 20.0–28.0)
Calcium, Ion: 1.15 mmol/L (ref 1.15–1.40)
Calcium, Ion: 1.18 mmol/L (ref 1.15–1.40)
HCT: 29 % — ABNORMAL LOW (ref 36.0–46.0)
HCT: 31 % — ABNORMAL LOW (ref 36.0–46.0)
Hemoglobin: 9.9 g/dL — ABNORMAL LOW (ref 12.0–15.0)
Hemoglobin: 10.5 g/dL — ABNORMAL LOW (ref 12.0–15.0)
O2 Saturation: 82 %
O2 Saturation: 96 %
Potassium: 3 mmol/L — ABNORMAL LOW (ref 3.5–5.1)
Potassium: 3.5 mmol/L (ref 3.5–5.1)
Sodium: 143 mmol/L (ref 135–145)
Sodium: 145 mmol/L (ref 135–145)
TCO2: 30 mmol/L (ref 22–32)
TCO2: 28 mmol/L (ref 22–32)
pCO2 arterial: 49.9 mmHg — ABNORMAL HIGH (ref 32.0–48.0)
pCO2 arterial: 49.9 mmHg — ABNORMAL HIGH (ref 32.0–48.0)
pH, Arterial: 7.365 (ref 7.350–7.450)
pH, Arterial: 7.335 — ABNORMAL LOW (ref 7.350–7.450)
pO2, Arterial: 49 mmHg — ABNORMAL LOW (ref 83.0–108.0)
pO2, Arterial: 86 mmHg (ref 83.0–108.0)

## 2020-08-11 LAB — COMPREHENSIVE METABOLIC PANEL
ALT: 21 U/L (ref 0–44)
AST: 34 U/L (ref 15–41)
Albumin: 2.9 g/dL — ABNORMAL LOW (ref 3.5–5.0)
Alkaline Phosphatase: 80 U/L (ref 38–126)
Anion gap: 10 (ref 5–15)
BUN: 11 mg/dL (ref 8–23)
CO2: 29 mmol/L (ref 22–32)
Calcium: 8.5 mg/dL — ABNORMAL LOW (ref 8.9–10.3)
Chloride: 102 mmol/L (ref 98–111)
Creatinine, Ser: 1.06 mg/dL — ABNORMAL HIGH (ref 0.44–1.00)
GFR, Estimated: 60 mL/min — ABNORMAL LOW (ref >60)
Glucose, Bld: 104 mg/dL — ABNORMAL HIGH (ref 70–99)
Potassium: 3.4 mmol/L — ABNORMAL LOW (ref 3.5–5.1)
Sodium: 141 mmol/L (ref 135–145)
Total Bilirubin: 0.6 mg/dL (ref 0.3–1.2)
Total Protein: 6 g/dL — ABNORMAL LOW (ref 6.5–8.1)

## 2020-08-11 LAB — CBC WITH DIFFERENTIAL/PLATELET
Abs Immature Granulocytes: 0.08 10*3/uL — ABNORMAL HIGH (ref 0.00–0.07)
Basophils Absolute: 0.1 10*3/uL (ref 0.0–0.1)
Basophils Relative: 1 %
Eosinophils Absolute: 0.2 10*3/uL (ref 0.0–0.5)
Eosinophils Relative: 1 %
HCT: 37.2 % (ref 36.0–46.0)
Hemoglobin: 11.5 g/dL — ABNORMAL LOW (ref 12.0–15.0)
Immature Granulocytes: 1 %
Lymphocytes Relative: 11 %
Lymphs Abs: 1.9 10*3/uL (ref 0.7–4.0)
MCH: 29.6 pg (ref 26.0–34.0)
MCHC: 30.9 g/dL (ref 30.0–36.0)
MCV: 95.6 fL (ref 80.0–100.0)
Monocytes Absolute: 1.1 10*3/uL — ABNORMAL HIGH (ref 0.1–1.0)
Monocytes Relative: 7 %
Neutro Abs: 13.4 10*3/uL — ABNORMAL HIGH (ref 1.7–7.7)
Neutrophils Relative %: 79 %
Platelets: 358 10*3/uL (ref 150–400)
RBC: 3.89 MIL/uL (ref 3.87–5.11)
RDW: 16.1 % — ABNORMAL HIGH (ref 11.5–15.5)
WBC: 16.8 10*3/uL — ABNORMAL HIGH (ref 4.0–10.5)
nRBC: 0 % (ref 0.0–0.2)

## 2020-08-11 LAB — URINALYSIS, ROUTINE W REFLEX MICROSCOPIC
Bilirubin Urine: NEGATIVE
Glucose, UA: NEGATIVE mg/dL
Hgb urine dipstick: NEGATIVE
Ketones, ur: NEGATIVE mg/dL
Leukocytes,Ua: NEGATIVE
Nitrite: NEGATIVE
Protein, ur: NEGATIVE mg/dL
Specific Gravity, Urine: 1.018 (ref 1.005–1.030)
pH: 5 (ref 5.0–8.0)

## 2020-08-11 LAB — APTT: aPTT: 27 seconds (ref 24–36)

## 2020-08-11 LAB — RAPID URINE DRUG SCREEN, HOSP PERFORMED
Amphetamines: NOT DETECTED
Barbiturates: NOT DETECTED
Benzodiazepines: POSITIVE — AB
Cocaine: NOT DETECTED
Opiates: NOT DETECTED
Tetrahydrocannabinol: NOT DETECTED

## 2020-08-11 LAB — LACTIC ACID, PLASMA: Lactic Acid, Venous: 1.7 mmol/L (ref 0.5–1.9)

## 2020-08-11 LAB — SARS CORONAVIRUS 2 BY RT PCR (HOSPITAL ORDER, PERFORMED IN ~~LOC~~ HOSPITAL LAB): SARS Coronavirus 2: NEGATIVE

## 2020-08-11 LAB — PROTIME-INR
INR: 1 (ref 0.8–1.2)
Prothrombin Time: 12.9 seconds (ref 11.4–15.2)

## 2020-08-11 LAB — BRAIN NATRIURETIC PEPTIDE: B Natriuretic Peptide: 72.1 pg/mL (ref 0.0–100.0)

## 2020-08-11 LAB — D-DIMER, QUANTITATIVE: D-Dimer, Quant: 3.41 ug/mL-FEU — ABNORMAL HIGH (ref 0.00–0.50)

## 2020-08-11 LAB — POC SARS CORONAVIRUS 2 AG -  ED: SARS Coronavirus 2 Ag: NEGATIVE

## 2020-08-11 LAB — ETHANOL: Alcohol, Ethyl (B): <10 mg/dL (ref <10)

## 2020-08-11 MED ORDER — VANCOMYCIN HCL 750 MG/150ML IV SOLN
750.0000 mg | Freq: Two times a day (BID) | INTRAVENOUS | Status: DC
  Administered 2020-08-12: 750 mg via INTRAVENOUS
  Filled 2020-08-11: qty 150

## 2020-08-11 MED ORDER — SODIUM CHLORIDE 0.9 % IV SOLN
2.0000 g | Freq: Two times a day (BID) | INTRAVENOUS | Status: DC
  Administered 2020-08-12: 2 g via INTRAVENOUS
  Filled 2020-08-11: qty 2

## 2020-08-11 MED ORDER — ALBUTEROL SULFATE (2.5 MG/3ML) 0.083% IN NEBU: 2.5000 mg | INHALATION_SOLUTION | Freq: Four times a day (QID) | RESPIRATORY_TRACT | Status: DC | PRN

## 2020-08-11 MED ORDER — LEVOTHYROXINE SODIUM 25 MCG PO TABS
125.0000 ug | ORAL_TABLET | Freq: Every day | ORAL | Status: DC
  Administered 2020-08-12 – 2020-08-14 (×3): 125 ug via ORAL
  Filled 2020-08-11 (×3): qty 1

## 2020-08-11 MED ORDER — LACTATED RINGERS IV BOLUS
1000.0000 mL | Freq: Once | INTRAVENOUS | Status: AC
  Administered 2020-08-11: 1000 mL via INTRAVENOUS

## 2020-08-11 MED ORDER — IOHEXOL 350 MG/ML SOLN
100.0000 mL | Freq: Once | INTRAVENOUS | Status: AC | PRN
  Administered 2020-08-11: 100 mL via INTRAVENOUS

## 2020-08-11 MED ORDER — SODIUM CHLORIDE 0.9 % IV BOLUS
500.0000 mL | Freq: Once | INTRAVENOUS | Status: AC
  Administered 2020-08-11: 500 mL via INTRAVENOUS

## 2020-08-11 MED ORDER — LURASIDONE HCL 40 MG PO TABS
40.0000 mg | ORAL_TABLET | Freq: Every day | ORAL | Status: DC
  Administered 2020-08-11 – 2020-08-14 (×4): 40 mg via ORAL
  Filled 2020-08-11 (×4): qty 1

## 2020-08-11 MED ORDER — ENOXAPARIN SODIUM 40 MG/0.4ML ~~LOC~~ SOLN
40.0000 mg | SUBCUTANEOUS | Status: DC
  Administered 2020-08-12: 40 mg via SUBCUTANEOUS
  Filled 2020-08-11: qty 0.4

## 2020-08-11 MED ORDER — UMECLIDINIUM BROMIDE 62.5 MCG/INH IN AEPB
1.0000 | INHALATION_SPRAY | Freq: Every day | RESPIRATORY_TRACT | Status: DC
  Administered 2020-08-12 – 2020-08-14 (×4): 1 via RESPIRATORY_TRACT

## 2020-08-11 MED ORDER — FLUTICASONE FUROATE-VILANTEROL 200-25 MCG/INH IN AEPB
1.0000 | INHALATION_SPRAY | Freq: Every day | RESPIRATORY_TRACT | Status: DC
  Filled 2020-08-11: qty 28

## 2020-08-11 MED ORDER — VANCOMYCIN HCL IN DEXTROSE 1-5 GM/200ML-% IV SOLN: 1000.0000 mg | Freq: Once | INTRAVENOUS | Status: DC

## 2020-08-11 MED ORDER — SODIUM CHLORIDE 0.9 % IV BOLUS (SEPSIS)
1000.0000 mL | Freq: Once | INTRAVENOUS | Status: DC
Start: 2020-08-11 — End: 2020-08-11

## 2020-08-11 MED ORDER — FLUTICASONE-UMECLIDIN-VILANT 100-62.5-25 MCG/INH IN AEPB: 1.0000 | INHALATION_SPRAY | Freq: Every day | RESPIRATORY_TRACT | Status: DC

## 2020-08-11 MED ORDER — VANCOMYCIN HCL 1500 MG/300ML IV SOLN
1500.0000 mg | Freq: Once | INTRAVENOUS | Status: AC
  Administered 2020-08-11: 1500 mg via INTRAVENOUS
  Filled 2020-08-11: qty 300

## 2020-08-11 MED ORDER — FAMOTIDINE 20 MG PO TABS
20.0000 mg | ORAL_TABLET | Freq: Every day | ORAL | Status: DC
  Administered 2020-08-11 – 2020-08-14 (×4): 20 mg via ORAL
  Filled 2020-08-11: qty 2
  Filled 2020-08-11 (×3): qty 1

## 2020-08-11 MED ORDER — UMECLIDINIUM BROMIDE 62.5 MCG/INH IN AEPB
1.0000 | INHALATION_SPRAY | Freq: Every day | RESPIRATORY_TRACT | Status: DC
  Filled 2020-08-11: qty 7

## 2020-08-11 MED ORDER — METRONIDAZOLE IN NACL 5-0.79 MG/ML-% IV SOLN
500.0000 mg | Freq: Once | INTRAVENOUS | Status: AC
  Administered 2020-08-11: 500 mg via INTRAVENOUS
  Filled 2020-08-11: qty 100

## 2020-08-11 MED ORDER — FLUTICASONE FUROATE-VILANTEROL 200-25 MCG/INH IN AEPB
1.0000 | INHALATION_SPRAY | Freq: Every day | RESPIRATORY_TRACT | Status: DC
  Administered 2020-08-12 – 2020-08-14 (×4): 1 via RESPIRATORY_TRACT

## 2020-08-11 MED ORDER — NICOTINE 21 MG/24HR TD PT24
21.0000 mg | MEDICATED_PATCH | Freq: Every day | TRANSDERMAL | Status: DC
  Administered 2020-08-12 – 2020-08-14 (×3): 21 mg via TRANSDERMAL
  Filled 2020-08-11 (×3): qty 1

## 2020-08-11 MED ORDER — ROSUVASTATIN CALCIUM 5 MG PO TABS
10.0000 mg | ORAL_TABLET | Freq: Every day | ORAL | Status: DC
  Administered 2020-08-11 – 2020-08-14 (×4): 10 mg via ORAL
  Filled 2020-08-11 (×5): qty 2

## 2020-08-11 MED ORDER — SODIUM CHLORIDE 0.9 % IV BOLUS
1000.0000 mL | Freq: Once | INTRAVENOUS | Status: AC
  Administered 2020-08-11: 1000 mL via INTRAVENOUS

## 2020-08-11 MED ORDER — SODIUM CHLORIDE 0.9 % IV SOLN: INTRAVENOUS | Status: DC

## 2020-08-11 MED ORDER — VENLAFAXINE HCL ER 75 MG PO CP24
75.0000 mg | ORAL_CAPSULE | Freq: Every day | ORAL | Status: DC
  Administered 2020-08-12 – 2020-08-14 (×4): 75 mg via ORAL
  Filled 2020-08-11 (×3): qty 1

## 2020-08-11 MED ORDER — SODIUM CHLORIDE 0.9 % IV SOLN
2.0000 g | Freq: Once | INTRAVENOUS | Status: AC
  Administered 2020-08-11: 2 g via INTRAVENOUS
  Filled 2020-08-11: qty 2

## 2020-08-11 NOTE — ED Notes (Signed)
EDP made aware of pts hypotension, verbal order for bolus

## 2020-08-11 NOTE — Progress Notes (Signed)
Pharmacy Antibiotic Note  Erin Lynch is a 60 y.o. female admitted on 08/11/2020 with lethargy and weakness s/p multiple fall concerning for an infection of unknown source.  Pharmacy has been consulted for Cefepime and vancomycin dosing.  SCr 1.06, WBC 16.8, LA 1.7  Plan: -Cefepime 2 gm IV Q 12 hours -Vancomycin 1500 mg IV load followed by vancomycin 750 mg IV  Q12 hours  -Monitor CBC, renal fx, cultures and clinical progress -VT at SS   Height: 5\' 3"  (160 cm) Weight: 78.5 kg (173 lb 1 oz) IBW/kg (Calculated) : 52.4  Temp (24hrs), Avg:99.2 F (37.3 C), Min:98.9 F (37.2 C), Max:99.4 F (37.4 C)  Recent Labs  Lab 08/11/20 1024 08/11/20 1025  WBC 16.8*  --   LATICACIDVEN  --  1.7    CrCl cannot be calculated (Patient's most recent lab result is older than the maximum 21 days allowed.).    No Known Allergies  Antimicrobials this admission: Cefepime 1/28 >>  Vancomycin 1/28 >>   Dose adjustments this admission:   Microbiology results: 1/28 BCx:  1/28 UCx:     Thank you for allowing pharmacy to be a part of this patient's care.  2/28, PharmD., BCPS, BCCCP Clinical Pharmacist Please refer to [facility] for unit-specific pharmacist

## 2020-08-11 NOTE — ED Provider Notes (Signed)
[facility] Provider Note   CSN: [id number] Arrival date & time: 08/11/20  1012     History Chief Complaint  Patient presents with  . Aphasia  . Weakness   LEVEL 5 CAVEAT - ALTERED MENTAL STATUS  Erin Lynch is a 60 y.o. female with PMHx HTN, HLD, Hypothyroidism, Fibromyalgia, CAD s/p NSTEMI, CHF with EF 55-60% (02/27/20) who presents to the ED via EMS with complaint of slurred speech and favoring right side (last known normal 2 days ago). Per EMS/Triage report daughter reported that pt was more lethargic and weak yesterday with multiple falls. Pt was found to be 80% on RA with EMS and placed on 4L. She is not typically on oxygen. She has also had a cough and pt reported burning with urinated to EMS. She is fully vaccinated.   Per daughter Erin Lynch via telephone - she first noticed confusion 2 days ago with some slurred speech however both seemed to be worse yesterday. Pt attempted to walk multiple times yesterday and kept falling; daughter had her lay in bed all day so she would not fall. Daughter thought she would feel better this morning after rest however pt seemed much worse today prompting her to call EMS. She has not noticed any unilateral weakness/favoring of one side/facial asymmetry. Daughter does mention that pt's friend recently had COVID however they have not seen each other and she denies any other recent sick contacts. She was unaware of pt having any burning with urination.   The history is provided by the patient, a relative, the EMS personnel and medical records.       Past Medical History:  Diagnosis Date  . Anxiety   . Arthritis   . Bulging disc   . CAD (coronary artery disease)    a. NSTEMI 12/2011: BMS-LAD b. 09/2015: cath showing patent stent w/ mild nonobstructive CAD.  Marland Kitchen. Chronic diastolic CHF (congestive heart failure) (HCC)   . Depression   . Fibromyalgia   . GERD (gastroesophageal reflux disease)   . Hyperlipidemia LDL  goal < 70   . Hypertension   . Hypothyroid   . Obesity (BMI 30.0-34.9)     Patient Active Problem List   Diagnosis Date Noted  . Confusion 02/28/2020  . AKI (acute kidney injury) (HCC) 02/28/2020  . COPD with acute exacerbation (HCC) 02/28/2020  . Septic shock (HCC) 02/25/2020  . Pneumonia 01/31/2019  . Coronary artery disease involving native coronary artery   . Unstable angina (HCC) 10/10/2015  . CAD (coronary artery disease) 01/14/2012  . Hyperlipidemia 01/14/2012  . Bipolar disorder, curr episode depressed, severe, w/psychotic features (HCC) 01/14/2012  . NSTEMI, initial episode of care [facility](HCC) 01/02/2012    Past Surgical History:  Procedure Laterality Date  . CARDIAC CATHETERIZATION  01/01/2012    WITH CORONARY ANGIOGRAM, left heart   . CARDIAC CATHETERIZATION N/A 10/11/2015   Procedure: Left Heart Cath and Coronary Angiography;  Surgeon: Tonny BollmanMichael Cooper, MD;  [facility];  Service: Cardiovascular;  Laterality: N/A;  . CORONARY STENT PLACEMENT    . LEFT HEART CATHETERIZATION WITH CORONARY ANGIOGRAM N/A 01/01/2012   Procedure: LEFT HEART CATHETERIZATION WITH CORONARY ANGIOGRAM;  Surgeon: Iran OuchMuhammad A Arida, MD;  Location: [facility] CATH LAB;  Service: Cardiovascular;  Laterality: N/A;     OB History   No obstetric history on file.     Family History  Problem Relation Age of Onset  . Heart attack Mother   . Heart attack Father  Social History   Tobacco Use  . Smoking status: Current Every Day Smoker    Packs/day: 1.00    Years: 25.00    Pack years: 25.00    Types: Cigarettes  . Smokeless tobacco: Never Used  . Tobacco comment: stop june 2013  Substance Use Topics  . Alcohol use: No  . Drug use: No    Home Medications Prior to Admission medications   Medication Sig Start Date End Date Taking? Authorizing Provider  ALPRAZolam Prudy Feeler) 1 MG tablet Take 1 mg by mouth 2 (two) times daily. 09/28/15  Yes [provider]  clopidogrel (PLAVIX) 75 MG  tablet TAKE 1 TABLET BY MOUTH EVERY DAY Patient taking differently: Take 75 mg by mouth daily. 05/04/13  Yes Laurey Morale, MD  desvenlafaxine (PRISTIQ) 50 MG 24 hr tablet Take 50 mg by mouth daily. 11/03/19  Yes [provider]  DULoxetine (CYMBALTA) 60 MG capsule Take 60 mg by mouth daily. 07/20/15  Yes [provider]  escitalopram (LEXAPRO) 20 MG tablet Take 20 mg by mouth daily. 08/01/20  Yes [provider]  furosemide (LASIX) 40 MG tablet Take 40 mg by mouth daily. 02/23/20  Yes [provider]  LATUDA 40 MG TABS tablet Take 40 mg by mouth daily.  09/30/15  Yes [provider]  levothyroxine (SYNTHROID, LEVOTHROID) 125 MCG tablet Take 125 mcg by mouth daily. 10/09/15  Yes [provider]  lisinopril (PRINIVIL,ZESTRIL) 5 MG tablet Take 5 mg by mouth daily. 07/20/15  Yes [provider]  omeprazole (PRILOSEC) 40 MG capsule Take 40 mg by mouth daily. 08/30/15  Yes [provider]  rosuvastatin (CRESTOR) 10 MG tablet Take 10 mg by mouth daily. 02/05/20  Yes [provider]  traZODone (DESYREL) 100 MG tablet Take 100 mg by mouth at bedtime as needed for sleep. 08/01/20  Yes [provider]  TRELEGY ELLIPTA 100-62.5-25 MCG/INH AEPB Inhale 1 puff into the lungs daily. 01/18/19  Yes [provider]  VENTOLIN HFA 108 (90 Base) MCG/ACT inhaler Inhale 2 puffs into the lungs every 6 (six) hours as needed for shortness of breath. 12/30/18  Yes [provider]  aspirin EC 81 MG EC tablet Take 1 tablet (81 mg total) by mouth daily. Patient not taking: Reported on 01/31/2019 10/11/15   Ellsworth Lennox, PA-C  atorvastatin (LIPITOR) 80 MG tablet Take 80 mg by mouth daily. 08/01/15   [provider]  gabapentin (NEURONTIN) 600 MG tablet Take 600 mg by mouth at bedtime.     [provider]  isosorbide mononitrate (IMDUR) 30 MG 24 hr tablet TAKE 0.5 TABLETS (15 MG TOTAL) BY MOUTH DAILY. Patient not  taking: Reported on 02/25/2020 03/03/17   Ellsworth Lennox, PA-C  metoprolol succinate (TOPROL-XL) 25 MG 24 hr tablet Take 25 mg by mouth daily. 07/20/15   [provider]  nicotine (NICODERM CQ - DOSED IN MG/24 HOURS) 21 mg/24hr patch Place 1 patch (21 mg total) onto the skin daily. Patient not taking: Reported on 02/25/2020 02/03/19   Joycelyn Das, MD  nitroGLYCERIN (NITROSTAT) 0.4 MG SL tablet Place 1 tablet (0.4 mg total) under the tongue every 5 (five) minutes as needed for chest pain. 01/02/12 02/25/20  Barrett, Joline Salt, PA-C  zolpidem (AMBIEN) 10 MG tablet Take 10 mg by mouth at bedtime as needed for sleep.  10/01/15   [provider]    Allergies    Patient has no known allergies.  Review of Systems   Review  of Systems  Unable to perform ROS: Mental status change  Respiratory: Positive for cough.   Neurological: Positive for speech difficulty. Negative for weakness and numbness.  Psychiatric/Behavioral: Positive for confusion.    Physical Exam Updated Vital Signs BP (!) 93/58 (BP Location: Right Arm)   Pulse 82   Temp 98.9 F (37.2 C) (Oral)   Resp (!) 23   Ht 5\' 3"  (1.6 m)   Wt 78.5 kg   SpO2 94%   BMI 30.66 kg/m   Physical Exam Vitals and nursing note reviewed.  Constitutional:      Appearance: She is obese. She is not ill-appearing.  HENT:     Head: Normocephalic and atraumatic.  Eyes:     Extraocular Movements: Extraocular movements intact.     Conjunctiva/sclera: Conjunctivae normal.     Pupils: Pupils are equal, round, and reactive to light.  Cardiovascular:     Rate and Rhythm: Normal rate and regular rhythm.  Pulmonary:     Effort: Tachypnea present.     Breath sounds: Rales present.     Comments: Currently on 2L [place] satting 92%. Actively coughing in the room.  Abdominal:     Palpations: Abdomen is soft.     Tenderness: There is no abdominal tenderness. There is no guarding or rebound.  Musculoskeletal:     Cervical back: Neck supple.   Skin:    General: Skin is warm and dry.  Neurological:     Mental Status: She is alert.     GCS: GCS eye subscore is 4. GCS verbal subscore is 5. GCS motor subscore is 6.     Cranial Nerves: Cranial nerves are intact.     Sensory: Sensation is intact.     Coordination: Finger-Nose-Finger Test normal.  Psychiatric:        Speech: Speech is slurred.     ED Results / Procedures / Treatments   Labs (all labs ordered are listed, but only abnormal results are displayed) Labs Reviewed  COMPREHENSIVE METABOLIC PANEL - Abnormal; Notable for the following components:      Result Value   Potassium 3.4 (*)    Glucose, Bld 104 (*)    Creatinine, Ser 1.06 (*)    Calcium 8.5 (*)    Total Protein 6.0 (*)    Albumin 2.9 (*)    GFR, Estimated 60 (*)    All other components within normal limits  CBC WITH DIFFERENTIAL/PLATELET - Abnormal; Notable for the following components:   WBC 16.8 (*)    Hemoglobin 11.5 (*)    RDW 16.1 (*)    Neutro Abs 13.4 (*)    Monocytes Absolute 1.1 (*)    Abs Immature Granulocytes 0.08 (*)    All other components within normal limits  URINALYSIS, ROUTINE W REFLEX MICROSCOPIC - Abnormal; Notable for the following components:   APPearance HAZY (*)    All other components within normal limits  I-STAT ARTERIAL BLOOD GAS, ED - Abnormal; Notable for the following components:   pCO2 arterial 49.9 (*)    pO2, Arterial 49 (*)    Bicarbonate 28.5 (*)    Acid-Base Excess 3.0 (*)    Potassium 3.0 (*)    HCT 29.0 (*)    Hemoglobin 9.9 (*)    All other components within normal limits  SARS CORONAVIRUS 2 BY RT PCR (HOSPITAL ORDER, PERFORMED IN [facility])  CULTURE, BLOOD (SINGLE)  URINE CULTURE  CULTURE, BLOOD (SINGLE)  LACTIC ACID, PLASMA  PROTIME-INR  APTT  ETHANOL  BRAIN NATRIURETIC PEPTIDE  DIFFERENTIAL  RAPID URINE DRUG SCREEN, HOSP PERFORMED  D-DIMER, QUANTITATIVE (NOT AT [facility])  POC SARS CORONAVIRUS 2 AG -  ED    EKG EKG  Interpretation  Date/Time:  Friday August 11 2020 10:17:03 EST Ventricular Rate:  83 PR Interval:    QRS Duration: 92 QT Interval:  427 QTC Calculation: 502 R Axis:   8 Text Interpretation: Sinus rhythm Short PR interval Prolonged QT interval Confirmed by Benjiman Core [phone]) on 08/11/2020 10:35:17 AM   Radiology CT Head Wo Contrast  Result Date: 08/11/2020 CLINICAL DATA:  Slurred speech, hypotensive EXAM: CT HEAD WITHOUT CONTRAST TECHNIQUE: Contiguous axial images were obtained from the base of the skull through the vertex without intravenous contrast. COMPARISON:  02/24/2020 FINDINGS: Mild motion degradation. Brain: No evidence of acute infarction, hemorrhage, hydrocephalus, extra-axial collection or mass lesion/mass effect. Vascular: No hyperdense vessel or unexpected calcification. Skull: Normal. Negative for fracture or focal lesion. Sinuses/Orbits: The visualized paranasal sinuses are essentially clear. The mastoid air cells are unopacified. Other: None. IMPRESSION: Normal head CT. Electronically Signed   By: Charline Bills M.D.   On: 08/11/2020 12:34   DG Chest Port 1 View  Result Date: 08/11/2020 CLINICAL DATA:  Cough.  Possible sepsis. EXAM: PORTABLE CHEST 1 VIEW COMPARISON:  Chest x-ray dated April 26, 2020. FINDINGS: The heart size and mediastinal contours are within normal limits. Normal pulmonary vascularity. Diffusely increased interstitial markings compared to the prior study. Small right pleural effusion. No consolidation or pneumothorax. Elevated right hemidiaphragm with mild right basilar atelectasis. No acute osseous abnormality. IMPRESSION: 1. Mild interstitial pulmonary edema and small right pleural effusion. Electronically Signed   By: Obie Dredge M.D.   On: 08/11/2020 11:04    Procedures .Critical Care Performed by: Tanda Rockers, PA-C Authorized by: Tanda Rockers, PA-C   Critical care provider statement:    Critical care time (minutes):  50    Critical care was necessary to treat or prevent imminent or life-threatening deterioration of the following conditions:  Sepsis and CNS failure or compromise   Critical care was time spent personally by me on the following activities:  Discussions with consultants, evaluation of patient's response to treatment, examination of patient, ordering and performing treatments and interventions, ordering and review of laboratory studies, ordering and review of radiographic studies, pulse oximetry, re-evaluation of patient's condition, obtaining history from patient or surrogate and review of old charts     Medications Ordered in ED Medications  vancomycin (VANCOREADY) IVPB 1500 mg/300 mL (1,500 mg Intravenous New Bag/Given 08/11/20 1434)  vancomycin (VANCOREADY) IVPB 750 mg/150 mL (has no administration in time range)  ceFEPIme (MAXIPIME) 2 g in sodium chloride 0.9 % 100 mL IVPB (has no administration in time range)  sodium chloride 0.9 % bolus 500 mL (0 mLs Intravenous Stopped 08/11/20 1104)  sodium chloride 0.9 % bolus 500 mL (0 mLs Intravenous Stopped 08/11/20 1224)  sodium chloride 0.9 % bolus 1,000 mL (0 mLs Intravenous Stopped 08/11/20 1242)  ceFEPIme (MAXIPIME) 2 g in sodium chloride 0.9 % 100 mL IVPB (0 g Intravenous Stopped 08/11/20 1317)  metroNIDAZOLE (FLAGYL) IVPB 500 mg (0 mg Intravenous Stopped 08/11/20 1432)  lactated ringers bolus 1,000 mL (1,000 mLs Intravenous New Bag/Given 08/11/20 1434)  sodium chloride 0.9 % bolus 1,000 mL (1,000 mLs Intravenous New Bag/Given 08/11/20 1406)    ED Course  I have reviewed the triage vital signs and the nursing notes.  Pertinent labs & imaging results that were available during my  care of the patient were reviewed by me and considered in my medical decision making (see chart for details).  Clinical Course as of 08/11/20 1503  Fri Aug 11, 2020  1215 WBC(!): 16.8 Code SEPSIS initiated [MV]  1227 SARS Coronavirus 2: NEGATIVE [MV]  1228 Lactic Acid,  Venous: 1.7 [MV]    Clinical Course User Index [MV] Tanda Rockers, PA-C   MDM Rules/Calculators/A&P                          60 year old female who presents to the ED today with aphasia, generalized weakness, confusion with last known normal 2 days ago.  Has also been having a cough.  Found to be hypoxic with EMS at 80%, placed on 4 L initially now weaned down to 2 L satting 92%.  Patient is afebrile on arrival, rectal temp 99.4.  Nontachycardic however mildly tachypneic.  Blood pressure soft at 93/58. Does not currently meet SIRS/Sepsis criteria.  Exam patient does have slurred speech which per daughter is new for the past 2 days.  She is able to follow commands and has no focal neuro deficits today besides her slurred speech.  I am more concerned for metabolic encephalopathy given her constellation of symptoms.  She has been having a cough as well as reported burning with urination, is vaccinated with Covid however given hypoxia there is concern.  Other pulmonary infection versus Covid versus UTI could be causing her encephalopathy.  Will work-up for infection at this time.  Will provide a small amount of fluids given hypotension, patient does have a history of CHF do not want to fluid overload.  Will also CAT scan head given new onset confusion as well as ABG.   No improvement in BP with 500 CC fluid bolus; additional 500 fluid bolus ordered. CXR with findings concerning for pulmonary edema and BNP pending.   ED physician Dr. Adela Lank evaluated patient and performed beside ultrasound given persistent low BP; see his procedure note. Has recommended an additional 1 L fluid bolus at this time.   CT Head negative; MRI ordered  CBC has returned with a leukocytosis 16.8; code sepsis initiated at this time without obvious source; urine still pending. Empiric abx ordered.  Lactic acid within normal limits  CMP with potassium 3.4, creatinine slightly elevated at 1.06. No other electrolyte abnormalities.   U/A has returned without signs of infection COVID negative  No obvious source of infection at this time. Still pending BNP which could account for patient's hypoxia on 2 L. ABG with normal pH at this time. Blood pressure continues to be hypotensive despite 2L fluid bolus; will consult PCCM at this time.   PCCM has evaluated patient; recommends additional fluids as she appears dry and to have medicine admit patient. Additional liter ordered.   Discussed case with Dr. Sande Brothers with IM who agrees to evaluate patient for admission.   This note was prepared using Dragon voice recognition software and may include unintentional dictation errors due to the inherent limitations of voice recognition software.   Final Clinical Impression(s) / ED Diagnoses Final diagnoses:  Slurred speech  Hypotension, unspecified hypotension type  Hypoxia    Rx / DC Orders ED Discharge Orders    None       Tanda Rockers, PA-C 08/11/20 1504    Benjiman Core, MD 08/11/20 1529

## 2020-08-11 NOTE — ED Notes (Signed)
Pt placed in trendelenburg position.

## 2020-08-11 NOTE — H&P (Signed)
Date: 08/11/2020               Patient Name:  Erin Lynch MRN: [id number]  DOB: Feb 25, 1961 Age / Sex: 60 y.o., female   PCP: Galvin Proffer, MD         Medical Service: Internal Medicine Teaching Service         Attending Physician: Dr. Earl Lagos     First Contact: Dr. Alphonzo Severance Pager: [phone]  Second Contact: Dr. Dolan Amen Pager: [phone]       After Hours (After 5p/  First Contact Pager: [phone]  weekends / holidays): Second Contact Pager: [phone]   Chief Complaint:  Dysarthria, generalized weakness, cough  History of Present Illness:   Ms. Erin Lynch is a 60 year old woman with history of recurrent pneumonia, COPD, tobacco use, CAD, HTN, HLD, HFpEF, hypothyroidism, fibromyalgia, bipolar disorder, anxiety who presents with dysarthria, weakness, and cough.  History obtained from patient and separately her daughter Jeanie Cooks) over the phone.  Daughter reports patient was in her usual state of health until yesterday AM when she found the patient lying on the floor of her bedroom after reportedly falling while trying to get out of bed. She was able to get her up on her own, however the patient did seem sleepier, weaker, wasn't walking as well, and was not eating or drinking at her baseline. Also heard her wheezing throughout the day and coughing overnight. This morning she found the patient lying on the kitchen floor with white foam around her mouth. She and a friend of the patient's tried to get her up, but they couldn't. The patient's speech was unintelligible. Her BP was low, so they called 911.  The daughter notes that the patient has had similar symptoms prior to hospitalizations in the past, "always for something to do with her lungs." She does not recall the patient complaining of any other symptoms except for endorses generalized pain. Notes the she and the patient's husband help her with her medications because her "memory is not like it used to be." Reports that the  patient is generally able to eat anything she wants as long as she takes small bites and chews thoroughly.   The patient states she came to the hospital today because of low blood pressures. Otherwise unable to report the events leading to her ED visit. Reports she has had a cough for the past 4-5 days which is productive of green/yellow sputum. Denies chronic cough. Endorses a current headache which originated on the R side of her head and "wraps all the way around," notes her sporadic headaches are alleviated with Tylenol. She is aware of her slurred speech, states people were unable to understand her this morning, though notes her speaking has markedly improved since she has been in the ED. Endorses eating normally the past several days as well as drinking "a lot." Reports several recent hospital stays for recurrent pneumonia. Two nights ago she woke up having coughed up undigested food, took a Pepcid, then fell back asleep. Notes this happens occasionally.   On review of systems, she denies dizziness, orthopnea, fevers, chills, vision changes, neck pain, chest pain, palpitations, abdominal pain, dysuria, constipation, diarrhea. States she takes Lasix (unsure of dose) daily for her heart and Plavix and lipitor for a heart attack she had 4-5 years ago. Otherwise unable to name her medications. Reports she is vaccinated against Covid-19 with Moderna x 2 but is not boosted.   ED Course: Afebrile, RR 15-23, P  60s-80s, BP 70s-80s/40s-50s (MAPs 50s-60s), placed on 2-4 L [place] to maintain saturations >93% (PO2 49 on I-stat ABG). CBC with leukocytosis (16.8), normocytic anemia (Hgb 11.5 and MCV 95.6). CMP with K 3.4, creatinine 1.06 (baseline 0.8). Lactic acid 1.7. BNP 72.1. D-dimer 3.41. COVID negative. UDS positive for benzodiazepines. Ethanol <10. Urinalysis unremarkable. CXR with mild interstitial pulmonary edema and small R pleural effusion. CTH without evidence of CVA. EDP consulted PCCM who recommended  additional IVF and MRI/neuro consult. Given total of 4 L IVF and started on vancomycin and cefepime.  Meds:  Current Meds  Medication Sig  . ALPRAZolam (XANAX) 1 MG tablet Take 1 mg by mouth 2 (two) times daily.  . clopidogrel (PLAVIX) 75 MG tablet TAKE 1 TABLET BY MOUTH EVERY DAY (Patient taking differently: Take 75 mg by mouth daily.)  . DULoxetine (CYMBALTA) 60 MG capsule Take 60 mg by mouth daily.  Marland Kitchen escitalopram (LEXAPRO) 20 MG tablet Take 20 mg by mouth daily.  . furosemide (LASIX) 40 MG tablet Take 40 mg by mouth daily.  Marland Kitchen LATUDA 40 MG TABS tablet Take 40 mg by mouth daily.   Marland Kitchen levothyroxine (SYNTHROID, LEVOTHROID) 125 MCG tablet Take 125 mcg by mouth daily.  Marland Kitchen lisinopril (PRINIVIL,ZESTRIL) 5 MG tablet Take 5 mg by mouth daily.  Marland Kitchen omeprazole (PRILOSEC) 40 MG capsule Take 40 mg by mouth daily.  . rosuvastatin (CRESTOR) 10 MG tablet Take 10 mg by mouth daily.    Social History: Daughter Jeanie Cooks) lives with her and her husband. 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 Has a 50-pack-year smoking history, currently smokes 1-2 packs per week. Endorses occasional usage of alcohol. Used cocaine over [employer] "only one line," however used more regularly in her 52s.  Family History:  Mother: MI, DM  Father: MI, Stroke, DM  Allergies: Allergies as of 08/11/2020  . (No Known Allergies)   Past Medical History:  Diagnosis Date  . Anxiety   . Arthritis   . Bulging disc   . CAD (coronary artery disease)    a. NSTEMI 12/2011: BMS-LAD b. 09/2015: cath showing patent stent w/ mild nonobstructive CAD.  Marland Kitchen Chronic diastolic CHF (congestive heart failure) (HCC)   . Depression   . Fibromyalgia   . GERD (gastroesophageal reflux disease)   . Hyperlipidemia LDL goal < 70   . Hypertension   . Hypothyroid   . Obesity (BMI 30.0-34.9)     Review of Systems: A complete ROS was negative except as per HPI.   Physical Exam: Blood pressure 104/69, pulse 69, temperature 99.4 F (37.4 C),  temperature source Rectal, resp. rate (!) 23, height 5\' 3"  (1.6 m), weight 78.5 kg, SpO2 98 %. Constitutional: chronically ill-appearing obese woman lying in bed, in no acute distress HENT: normocephalic atraumatic, mucous membranes dry; yellow-orange substance on R side of face at corner of mouth Eyes: conjunctiva non-erythematous Neck: supple Cardiovascular: regular rate and rhythm, no m/r/g; no lower extremity edema Pulmonary/Chest: normal work of breathing on 2 L; no wheezes or rales; rhonchi up to the mid R lung field Abdominal: soft, non-tender, non-distended MSK: normal bulk and tone Neurological: alert & oriented to person, place, and time; dysarthric; no facial droop; moves all extremities equally; sensation grossly intact Skin: warm and dry, decreased skin turgor, scattered, small (~1 cm) round crusted lesions on bilateral lower extremities Psych: Mood is "good," appropriate affect  Labs: CBC    Component Value Date/Time   WBC 16.8 (H) 08/11/2020 1024   RBC 3.89 08/11/2020 1024  HGB 9.9 (L) 08/11/2020 1204   HCT 29.0 (L) 08/11/2020 1204   PLT 358 08/11/2020 1024   MCV 95.6 08/11/2020 1024   MCH 29.6 08/11/2020 1024   MCHC 30.9 08/11/2020 1024   RDW 16.1 (H) 08/11/2020 1024   LYMPHSABS 1.9 08/11/2020 1024   MONOABS 1.1 (H) 08/11/2020 1024   EOSABS 0.2 08/11/2020 1024   BASOSABS 0.1 08/11/2020 1024     CMP     Component Value Date/Time   NA 143 08/11/2020 1204   K 3.0 (L) 08/11/2020 1204   CL 102 08/11/2020 1024   CO2 29 08/11/2020 1024   GLUCOSE 104 (H) 08/11/2020 1024   BUN 11 08/11/2020 1024   CREATININE 1.06 (H) 08/11/2020 1024   CALCIUM 8.5 (L) 08/11/2020 1024   PROT 6.0 (L) 08/11/2020 1024   ALBUMIN 2.9 (L) 08/11/2020 1024   AST 34 08/11/2020 1024   ALT 21 08/11/2020 1024   ALKPHOS 80 08/11/2020 1024   BILITOT 0.6 08/11/2020 1024   GFRNONAA 60 (L) 08/11/2020 1024   GFRAA >60 03/01/2020 0414    Imaging: CT Head Wo Contrast  Result Date:  08/11/2020 CLINICAL DATA:  Slurred speech, hypotensive EXAM: CT HEAD WITHOUT CONTRAST TECHNIQUE: Contiguous axial images were obtained from the base of the skull through the vertex without intravenous contrast. COMPARISON:  02/24/2020 FINDINGS: Mild motion degradation. Brain: No evidence of acute infarction, hemorrhage, hydrocephalus, extra-axial collection or mass lesion/mass effect. Vascular: No hyperdense vessel or unexpected calcification. Skull: Normal. Negative for fracture or focal lesion. Sinuses/Orbits: The visualized paranasal sinuses are essentially clear. The mastoid air cells are unopacified. Other: None. IMPRESSION: Normal head CT. Electronically Signed   By: Charline Bills M.D.   On: 08/11/2020 12:34   DG Chest Port 1 View  Result Date: 08/11/2020 CLINICAL DATA:  Cough.  Possible sepsis. EXAM: PORTABLE CHEST 1 VIEW COMPARISON:  Chest x-ray dated April 26, 2020. FINDINGS: The heart size and mediastinal contours are within normal limits. Normal pulmonary vascularity. Diffusely increased interstitial markings compared to the prior study. Small right pleural effusion. No consolidation or pneumothorax. Elevated right hemidiaphragm with mild right basilar atelectasis. No acute osseous abnormality. IMPRESSION: 1. Mild interstitial pulmonary edema and small right pleural effusion. Electronically Signed   By: Obie Dredge M.D.   On: 08/11/2020 11:04    EKG: personally reviewed my interpretation is normal sinus rhythm with no ST elevations or T wave inversions.  Assessment & Plan by Problem: Active Problems:   Weakness   Ms. Hannan Tetzlaff is a 60 year old woman with history of recurrent pneumonia, COPD, tobacco use, CAD, HTN, HLD, HFpEF, hypothyroidism, fibromyalgia, bipolar disorder, anxiety who presents with dysarthria, weakness, and cough and admitted for further work-up and evaluation.  Tachypnea, severe hypotension, leukocytosis concerning for sepsis Elevated D-dimer Presents with  productive cough, generalized weakness, and dysarthria after waking up in the middle of the night after coughing up undigested food 2 nights ago. Also poor PO intake. Initially hypotensive with MAPs 50s-60s, improving with IVF. Tachypneic with RR 15-23, new oxygen requirement of 2-4 L to maintain saturations >93%. On exam, rhonchi over the R lung fields. Leukocytosis to 16.8. Lactic acid 1.7. COVID negative (vaccinated and boosted). Portable 1-view CXR does not show consolidation, however there is elevated R hemidiaphragm with mild right basilar atelectasis, mild interstitial pulmonary edema, and small R pleural effusion. With leukocytosis and tachypnea, patient meets SIRS criteria and given the patient's history of recurrent pneumonia and recent nighttime episode of coughing undigested food, concerned sepsis secondary  to aspiration pneumonia. Urinalysis unremarkable. Blood culture pending. Given patient's age, hypoxia, and elevated D-dimer, cannot rule out PE.  - Procalcitonin - 2-view CXR - CT angio chest PE protocol - Vancomycin and cefepime (Day 1: 1/28) - Blood cultures pending - s/p 4 L IVF - AM CBC - Trend fever curve - Wean oxygen as tolerated - SLP eval  Dysarthria Generalized weakness Patient presents with 2-day history of generalized weakness, and 1-day history of dysarthria. Dysarthria improving. CTH without evidence of CVA. Given dysarthria and patient's numerous risk factors for stroke, MRI brain ordered.  - MRI brain - Consider neuro consult depending on results of MRI brain - Minimize centrally acting medications - NPO until SLP evaluation - PT/OT eval and treat  Mild AKI Creatinine 1.06 on admission, up from baseline ~0.8. Suspect pre-renal in setting of decreased PO intake and continued use of Lasix. - IVF as above - AM BMP - Hold home lisinopril  COPD Not on oxygen at home. - Continue home Breo Ellipta, Incruse Ellipta, Trelegy - albuterol PRN  Coronary artery  disease - Hold home Plavix - Continue home rosuvastatin 10 mg daily  HFpEF  02/2020 echocardiogram with EF 55-60%. Takes lisinopril, metoprolol, Lasix.  - Hold home lisinopril, metoprolol, and Lasix in setting of hypotension  Hypothyroidism - TSH - Continue home levothyroxine 125 mcg daily  Bipolar disorder On lurasidone and trazodone at home, managed by PCP. Patient does not currently follow with psychiatry. States her bipolar disorder and anxiety are well-controlled. - Continue home lurasidone (Latuda) 40 mg daily - Hold home trazodone  Anxiety - Continue home  On Lexapro and alprazolam   Fibromyalgia Chronic pain - Holding home gabapentin (centrally-acting)  Tobacco use - Nicotine patch   Diet: NPO until SLP evaluation VTE: Enoxaparin IVF: As above Code: DNR  Prior to Admission Living Arrangement: Home, living with husband and daughter Anticipated Discharge Location: TBD - pending PT/OT eval Barriers to Discharge: Hypotension, weakness, dysarthria  Dispo: Admit patient to Inpatient with expected length of stay greater than 2 midnights.  Signed: Alphonzo Severance, MD PGY-1 Internal Medicine Teaching Service Pager: [phone] 08/11/2020

## 2020-08-11 NOTE — Consult Note (Addendum)
NAME:  Erin Lynch, MRN:  [id number], DOB:  September 09, 1960, LOS: 0 ADMISSION DATE:  08/11/2020, CONSULTATION DATE:  08/11/20 REFERRING MD:  Hyman Hopes - EM, CHIEF COMPLAINT:  Dysarthria Hypotension   Brief History:  60 yo F presents w slurred speech, confusion, weakness  x 2 days. Hypotensive in ED after 1L IVF + possible pulmonary edema.  CT H without evidence of CVA   History of Present Illness:  60 yo F with frequent ED presentations and/or admissions with PMH chronic anemia, MDD, GAD, bipolar, HTN HLD CHF CAD who presented to ED via EMS for CC slurred speech. Slurred speech began 2 days ago, with associated lethargy, weakness, poor PO intake, multiple ground-level falls, which has been progressive in nature prompting ED presentation. Pt also complains of cough and dysuria. LKN 08/09/20.  CT H without evidence of acute CVA.  Hypotensive in ED after 2L IVF Blood and urine cx sent. Started on vanc cefepime flagyl for possible sepsis with WBC 16 and hypotension  CBC appears hemo-concentrated compared to several previoust labs (WBC 16, H/H 11/37, plt 358 compared to 6 / 8 / 27 / 245)  Cr elevated to 1.06 from baseline 0.76 ABG: 7.36/49/49/28 -- pt put on [facility]   PCCM consulted for evaluation given hypotension.   Past Medical History:  Dysphagia GAD  Fibromyalgia  GERD  MDD COPD  Aspiration PNA Tobacco use CHF HTN HLD CAD with NSTEMI Hypothyroidism  Significant Hospital Events:  1/28 presents to ED with dysarthria. Found to have hypovolemic shock due to poor PO intake. Volume resuscitated. CT H without abnormality but dysarthria persists   Consults:  PCCM  Procedures:    Significant Diagnostic Tests:  1/28 CT H> no evidence of acute intracranial abnormality 1/28 CXR mild interstitial edema. Elevated R hemidiaphragm   Micro Data:  1/28 COVID- neg 1/28 UCx> 1/28 BCx>   Antimicrobials:  1/28 vanc 1/28 cefepime 1/28 flagyl  Interim History / Subjective:  Receiving second L IVF    Objective   Blood pressure (!) 80/63, pulse 68, temperature 99.4 F (37.4 C), temperature source Rectal, resp. rate 15, height 5\' 3"  (1.6 m), weight 78.5 kg, SpO2 93 %.        Intake/Output Summary (Last 24 hours) at 08/11/2020 1339 Last data filed at 08/11/2020 1317 Gross per 24 hour  Intake 2100 ml  Output -  Net 2100 ml   Filed Weights   08/11/20 1018  Weight: 78.5 kg    Examination: General: Chronically and acutely ill middle aged F, trandelenburg NAD HENT: NCAT. Neck with redundant tissue. Anicteric sclera Lungs: Some loose sounding mucous near upper airway. Diminished bibasilar sounds. No crackles  Cardiovascular: rrr s1s2 cap refill < 3 seconds Abdomen: soft round hypoactive  Extremities: symmetrical bulk and tone. No cyanosis or clubbing Neuro: Dysarthric. Following commands. AAOx4. No unilateral weakness or facial droop appreciated GU: defer  Resolved Hospital Problem list     Assessment & Plan:   Acute encephalopathy -metabolic component with significant hypovolemia, possible infection Dysarthria -CT H without acute abnormality. Feel that CVA still possible however P -recommend MRI brain and stroke team consult -- d/w ED PA  -minimize CNS depressing medications -fq neuro checks -IVF as below   Hypoxemia (possible) -ABG 7.35/49.9/49/30/3/28.5/82 -- could be venous sample (taken when pt on 1-2L [place] and SpO2 95%) -mild pulm edema could be causing some mild hypoxia but wouldn't explain great discrepancy between PaO2 and SpO2  COPD -home trelegy Cough  - has history of dysphagia. Possible  aspiration, has recurrent hx of aspiration PNA  -could also be cough r/t GERD, copd, infection  P -repeat ABG -continue supplemental O2 for SpO2 > 88% -added on ddimer  -broad abx started in ED -- will defer to primary team, could consider de-escalation to aspiration coverage when appropriate  -I have ordered breo and incruse (roughly equivalent to OP reg) + PRN  albuterol - If repeat ABG c/w initial ABG, consider pseudohypoxia & respective sources   Hypovolemic Shock, improving -poor po intake x several days -clinically is incredibly dry -appreciate concern for CHF but with last formal EF 55%, current POCUS with flat IVC and BNP 72 pt needs more volume  -SBP >90 on PCCM eval with MAPs in 70s, receiving 2nd L IVF  P -cont IVF -SBP > 90 or MAP > 65  AKI -suspect prerenal due to hypovolemia  P -IVF and perfusion goals as above -trend renal indices   Leukocytosis -suspect this is more r/ hypovolemia than infectious. Pt afebrile, LA 1.7.  -has hx of chronic normocytic anemia, hgb 11 this admit from baseline 8-9  P -trend CBC and fever curve -cx sent and abx started in ED -- defer to primary  Weakness Recurring falls -looks like a pattern on several admissions including present when pt dehydrated, is concerning P -certainly will need PT eval this admission -have recommended neuro consult for dysarthria/MRI brain   Psychosocial:  Frequent ED presentations and hospital admissions -about 10 ED presentations and/or admissions in 2021 and about 9 ED presentations and/or admits in 2020, many presentations for similar problems - seems to follow with PCP ([facility] Dr. Ardelle Park) frequently as well P -not entirely sure what could be done to help fill gaps in care but would rec TOC consult.  -Also would consider OP psychiatry and psychology -- looks like PCP is managing MDD GAD Bipolar  -OP pain mgmnt consult could be considered for chronic pain and fibro    Other medical problems:  MDD, anxiety, bipolar disorder Fibromyalgia, chronic pain Hx opioid dependence, ?abuse (I see in PCP note mention of stealing opioid) Hypothyroidism Hx HTN, HLD HFpEF Chronic normocytic anemia  Dysphagia, aspiration, aspiration PNA Tobacco use   Thank you for consulting PCCM. The patient's hypotension is improving with IVF and shock has improved at  time of PCCM evaluation. PCCM will sign off. Please re-engage if patient's clinical status changes or if we can be of further assistance. Best practice (evaluated daily)  Diet: per primary  Pain/Anxiety/Delirium protocol (if indicated): na VAP protocol (if indicated): na DVT prophylaxis: per primary GI prophylaxis: --  Glucose control: per primary Mobility: per primary Disposition: Stable for admission with hospitalist team outside of ICU   Goals of Care:  Last date of multidisciplinary goals of care discussion:-- Family and staff present: -- Summary of discussion: -- Follow up goals of care discussion due: -- Code Status:  Prior is Full--   Labs   CBC: Recent Labs  Lab 08/11/20 1024 08/11/20 1204  WBC 16.8*  --   NEUTROABS 13.4*  --   HGB 11.5* 9.9*  HCT 37.2 29.0*  MCV 95.6  --   PLT 358  --     Basic Metabolic Panel: Recent Labs  Lab 08/11/20 1024 08/11/20 1204  NA 141 143  K 3.4* 3.0*  CL 102  --   CO2 29  --   GLUCOSE 104*  --   BUN 11  --   CREATININE 1.06*  --   CALCIUM 8.5*  --  GFR: Estimated Creatinine Clearance: 55.3 mL/min (A) (by C-G formula based on SCr of 1.06 mg/dL (H)). Recent Labs  Lab 08/11/20 1024 08/11/20 1025  WBC 16.8*  --   LATICACIDVEN  --  1.7    Liver Function Tests: Recent Labs  Lab 08/11/20 1024  AST 34  ALT 21  ALKPHOS 80  BILITOT 0.6  PROT 6.0*  ALBUMIN 2.9*   No results for input(s): LIPASE, AMYLASE in the last 168 hours. No results for input(s): AMMONIA in the last 168 hours.  ABG    Component Value Date/Time   PHART 7.365 08/11/2020 1204   PCO2ART 49.9 (H) 08/11/2020 1204   PO2ART 49 (L) 08/11/2020 1204   HCO3 28.5 (H) 08/11/2020 1204   TCO2 30 08/11/2020 1204   ACIDBASEDEF 5.0 (H) 02/24/2020 2206   O2SAT 82.0 08/11/2020 1204     Coagulation Profile: Recent Labs  Lab 08/11/20 1024  INR 1.0    Cardiac Enzymes: No results for input(s): CKTOTAL, CKMB, CKMBINDEX, TROPONINI in the last 168  hours.  HbA1C: No results found for: HGBA1C  CBG: No results for input(s): GLUCAP in the last 168 hours.  Review of Systems:   + for: weakness, fatigue, confusion, cough, dysuria, decreased PO intake, frequent fall  Past Medical History:  She,  has a past medical history of Anxiety, Arthritis, Bulging disc, CAD (coronary artery disease), Chronic diastolic CHF (congestive heart failure) (HCC), Depression, Fibromyalgia, GERD (gastroesophageal reflux disease), Hyperlipidemia LDL goal < 70, Hypertension, Hypothyroid, and Obesity (BMI 30.0-34.9).   Surgical History:   Past Surgical History:  Procedure Laterality Date  . CARDIAC CATHETERIZATION  01/01/2012    WITH CORONARY ANGIOGRAM, left heart   . CARDIAC CATHETERIZATION N/A 10/11/2015   Procedure: Left Heart Cath and Coronary Angiography;  Surgeon: Tonny Bollman, MD;  Location: [facility] INVASIVE CV LAB;  Service: Cardiovascular;  Laterality: N/A;  . CORONARY STENT PLACEMENT    . LEFT HEART CATHETERIZATION WITH CORONARY ANGIOGRAM N/A 01/01/2012   Procedure: LEFT HEART CATHETERIZATION WITH CORONARY ANGIOGRAM;  Surgeon: Iran Ouch, MD;  Location: [facility] CATH LAB;  Service: Cardiovascular;  Laterality: N/A;     Social History:   reports that she has been smoking cigarettes. She has a 25.00 pack-year smoking history. She has never used smokeless tobacco. She reports that she does not drink alcohol and does not use drugs.   Family History:  Her family history includes Heart attack in her father and mother.   Allergies No Known Allergies   Home Medications  Prior to Admission medications   Medication Sig Start Date End Date Taking? Authorizing Provider  ALPRAZolam Prudy Feeler) 1 MG tablet Take 1 mg by mouth 3 (three) times daily as needed for anxiety.  09/28/15   [provider]  aspirin EC 81 MG EC tablet Take 1 tablet (81 mg total) by mouth daily. Patient not taking: Reported on 01/31/2019 10/11/15   Ellsworth Lennox, PA-C   atorvastatin (LIPITOR) 80 MG tablet Take 80 mg by mouth daily. 08/01/15   [provider]  clopidogrel (PLAVIX) 75 MG tablet TAKE 1 TABLET BY MOUTH EVERY DAY Patient taking differently: Take 75 mg by mouth daily.  05/04/13   Laurey Morale, MD  desvenlafaxine (PRISTIQ) 50 MG 24 hr tablet Take 50 mg by mouth daily. 11/03/19   [provider]  DULoxetine (CYMBALTA) 60 MG capsule Take 60 mg by mouth daily. 07/20/15   [provider]  furosemide (LASIX) 40 MG tablet Take 40 mg by mouth daily.  02/23/20   [provider]  gabapentin (NEURONTIN) 600 MG tablet Take 600 mg by mouth at bedtime.     [provider]  isosorbide mononitrate (IMDUR) 30 MG 24 hr tablet TAKE 0.5 TABLETS (15 MG TOTAL) BY MOUTH DAILY. Patient not taking: Reported on 02/25/2020 03/03/17   Ellsworth Lennox, PA-C  LATUDA 40 MG TABS tablet Take 40 mg by mouth daily.  09/30/15   [provider]  levothyroxine (SYNTHROID, LEVOTHROID) 125 MCG tablet Take 125 mcg by mouth daily. 10/09/15   [provider]  lisinopril (PRINIVIL,ZESTRIL) 5 MG tablet Take 5 mg by mouth daily. 07/20/15   [provider]  metoprolol succinate (TOPROL-XL) 25 MG 24 hr tablet Take 25 mg by mouth daily. 07/20/15   [provider]  nicotine (NICODERM CQ - DOSED IN MG/24 HOURS) 21 mg/24hr patch Place 1 patch (21 mg total) onto the skin daily. Patient not taking: Reported on 02/25/2020 02/03/19   Joycelyn Das, MD  nitroGLYCERIN (NITROSTAT) 0.4 MG SL tablet Place 1 tablet (0.4 mg total) under the tongue every 5 (five) minutes as needed for chest pain. 01/02/12 02/25/20  Barrett, Joline Salt, PA-C  omeprazole (PRILOSEC) 40 MG capsule Take 40 mg by mouth daily. 08/30/15   [provider]  rosuvastatin (CRESTOR) 10 MG tablet Take 10 mg by mouth daily. 02/05/20   [provider]  TRELEGY ELLIPTA 100-62.5-25 MCG/INH AEPB Inhale 1 puff into the lungs daily. 01/18/19   [provider]   VENTOLIN HFA 108 (90 Base) MCG/ACT inhaler Inhale 2 puffs into the lungs every 6 (six) hours as needed for shortness of breath. 12/30/18   [provider]  zolpidem (AMBIEN) 10 MG tablet Take 10 mg by mouth at bedtime as needed for sleep.  10/01/15   [provider]     CRITICAL CARE Performed by: Lanier Clam   Total critical care time: 40 minutes  Critical care time was exclusive of separately billable procedures and treating other patients. Critical care was necessary to treat or prevent imminent or life-threatening deterioration.    Tessie Fass MSN, AGACNP-BC [facility] [phone] If no answer, [phone] 08/11/2020, 3:23 PM

## 2020-08-11 NOTE — ED Notes (Signed)
Attempted to give reportx1 

## 2020-08-11 NOTE — ED Provider Notes (Signed)
Ultrasound ED Echo  Date/Time: 08/11/2020 11:35 AM Performed by: Melene Plan, DO Authorized by: Melene Plan, DO   Procedure details:    Indications: hypotension     Views: subxiphoid and IVC view     Images: archived     Limitations:  Increased thoracic air and acoustic shadowing Findings:    Pericardium: no pericardial effusion     LV Function: normal (>50% EF)     RV Diameter: normal     IVC: collapsed   Impression:    Impression: normal and probable low CVP     Patient persistently hypotensive despite a small bolus of IV fluids.  Some concern with patient having history of heart failure last EF 55 to 60%.  Bedside ultrasound performed with likely volume depletion.  Recommended giving more fluids.   Melene Plan, DO 08/11/20 1136

## 2020-08-11 NOTE — ED Notes (Signed)
Main lab to add on d -dimer, also asked about UDS, lab is processing now

## 2020-08-11 NOTE — ED Triage Notes (Signed)
Pt from home with [facility] for slurred speech and pt favoring the right side. LSN 2 days ago. per daughter she was more lethargic and weak yesterday with multiple falls. Pt alert and oriented, slurred speech noted. Pt reports some burning with urination. +cough, fully vaccinated. 80% on room air, 4L applied with ems. Pt 88% on room air in ED, 2L [place] applied.

## 2020-08-11 NOTE — ED Notes (Signed)
This rn called lab about bloodwork not in process yet, lab states they are working on it now

## 2020-08-12 ENCOUNTER — Inpatient Hospital Stay (HOSPITAL_COMMUNITY): Payer: Medicare (Managed Care)

## 2020-08-12 DIAGNOSIS — J129 Viral pneumonia, unspecified: Secondary | ICD-10-CM

## 2020-08-12 DIAGNOSIS — J168 Pneumonia due to other specified infectious organisms: Secondary | ICD-10-CM

## 2020-08-12 DIAGNOSIS — E860 Dehydration: Secondary | ICD-10-CM

## 2020-08-12 LAB — BASIC METABOLIC PANEL
Anion gap: 8 (ref 5–15)
BUN: 9 mg/dL (ref 8–23)
CO2: 23 mmol/L (ref 22–32)
Calcium: 8.2 mg/dL — ABNORMAL LOW (ref 8.9–10.3)
Chloride: 108 mmol/L (ref 98–111)
Creatinine, Ser: 0.64 mg/dL (ref 0.44–1.00)
GFR, Estimated: >60 mL/min (ref >60)
Glucose, Bld: 88 mg/dL (ref 70–99)
Potassium: 3.1 mmol/L — ABNORMAL LOW (ref 3.5–5.1)
Sodium: 139 mmol/L (ref 135–145)

## 2020-08-12 LAB — URINE CULTURE: Culture: NO GROWTH

## 2020-08-12 LAB — CBC
HCT: 32.2 % — ABNORMAL LOW (ref 36.0–46.0)
Hemoglobin: 10.2 g/dL — ABNORMAL LOW (ref 12.0–15.0)
MCH: 29.8 pg (ref 26.0–34.0)
MCHC: 31.7 g/dL (ref 30.0–36.0)
MCV: 94.2 fL (ref 80.0–100.0)
Platelets: 295 10*3/uL (ref 150–400)
RBC: 3.42 MIL/uL — ABNORMAL LOW (ref 3.87–5.11)
RDW: 16 % — ABNORMAL HIGH (ref 11.5–15.5)
WBC: 14.8 10*3/uL — ABNORMAL HIGH (ref 4.0–10.5)
nRBC: 0 % (ref 0.0–0.2)

## 2020-08-12 LAB — TSH: TSH: 1.046 u[IU]/mL (ref 0.350–4.500)

## 2020-08-12 LAB — PROCALCITONIN: Procalcitonin: 0.24 ng/mL

## 2020-08-12 MED ORDER — ACETAMINOPHEN 325 MG PO TABS
650.0000 mg | ORAL_TABLET | Freq: Four times a day (QID) | ORAL | Status: DC | PRN
  Administered 2020-08-12 – 2020-08-14 (×3): 650 mg via ORAL
  Filled 2020-08-12 (×3): qty 2

## 2020-08-12 MED ORDER — TRAZODONE HCL 100 MG PO TABS
100.0000 mg | ORAL_TABLET | Freq: Every evening | ORAL | Status: DC | PRN
  Filled 2020-08-12: qty 1

## 2020-08-12 MED ORDER — SODIUM CHLORIDE 0.9 % IV SOLN
3.0000 g | Freq: Three times a day (TID) | INTRAVENOUS | Status: DC
  Administered 2020-08-12 – 2020-08-14 (×7): 3 g via INTRAVENOUS
  Filled 2020-08-12 (×4): qty 8
  Filled 2020-08-12: qty 3
  Filled 2020-08-12: qty 8
  Filled 2020-08-12: qty 3
  Filled 2020-08-12 (×2): qty 8

## 2020-08-12 MED ORDER — POTASSIUM CHLORIDE CRYS ER 20 MEQ PO TBCR
40.0000 meq | EXTENDED_RELEASE_TABLET | Freq: Once | ORAL | Status: AC
  Administered 2020-08-12: 40 meq via ORAL
  Filled 2020-08-12: qty 2

## 2020-08-12 MED ORDER — ALPRAZOLAM 0.5 MG PO TABS
1.0000 mg | ORAL_TABLET | Freq: Two times a day (BID) | ORAL | Status: DC | PRN
  Administered 2020-08-12 – 2020-08-14 (×6): 1 mg via ORAL
  Filled 2020-08-12 (×6): qty 2

## 2020-08-12 MED ORDER — SODIUM CHLORIDE 0.9 % IV SOLN: INTRAVENOUS | Status: DC | PRN

## 2020-08-12 NOTE — Evaluation (Signed)
Physical Therapy Evaluation Patient Details Name: Erin Lynch MRN: [id number] DOB: Sep 16, 1960 Today's Date: 08/12/2020   History of Present Illness  60 year old woman with history of recurrent pneumonia, COPD, tobacco use, CAD, HTN, HLD, HFpEF, hypothyroidism, fibromyalgia, bipolar disorder, anxiety who presents with dysarthria, weakness, and cough. Daughter reports the pt was found down, seemingly having a fall when trying to get out of bed. Next day pt found down in kitchen, unable to get up, with altered speech.  Clinical Impression  Pt presents to PT with deficits in functional mobility, gait, balance, cognition, endurance, strength, power. Pt is confused upon arrival, reports she is scared and doesn't know where she is or why she is here. PT re-orients the pt and it seems as if she is able to recall the situation with some cues. No family is present to confirm history from the patient and family does not answer home phone with attempted call during session. Pt mobilizes well, requiring minG for safety but otherwise does not need physical assistance at this time. Pt will benefit from continued acute PT POC to encourage use of assistive device to improve stability. PT recommends discharge home with HHPT and 24/7 supervision from family.    Follow Up Recommendations Home health PT;Supervision/Assistance - 24 hour    Equipment Recommendations  None recommended by PT (pt reports owning a RW and cane)    Recommendations for Other Services       Precautions / Restrictions Precautions Precautions: Fall Restrictions Weight Bearing Restrictions: No      Mobility  Bed Mobility Overal bed mobility: Needs Assistance Bed Mobility: Supine to Sit     Supine to sit: Supervision          Transfers Overall transfer level: Needs assistance Equipment used: None Transfers: Sit to/from Stand Sit to Stand: Min guard         General transfer comment: minG for safety, pt impulsively stands  while PT buttoning gown  Ambulation/Gait Ambulation/Gait assistance: Min guard Gait Distance (Feet): 100 Feet Assistive device: None (pt declines need of walker) Gait Pattern/deviations: Step-through pattern Gait velocity: reduced Gait velocity interpretation: <1.8 ft/sec, indicate of risk for recurrent falls General Gait Details: pt with increased lateral sway but no loss of balance, fatigues quickly  Stairs            Wheelchair Mobility    Modified Rankin (Stroke Patients Only) Modified Rankin (Stroke Patients Only) Pre-Morbid Rankin Score: Slight disability Modified Rankin: Moderately severe disability     Balance Overall balance assessment: Needs assistance Sitting-balance support: No upper extremity supported;Feet supported Sitting balance-Leahy Scale: Good     Standing balance support: No upper extremity supported Standing balance-Leahy Scale: Fair                               Pertinent Vitals/Pain Pain Assessment: Faces Faces Pain Scale: Hurts little more Pain Location: lower abdomen Pain Descriptors / Indicators: Grimacing Pain Intervention(s): Monitored during session    Home Living Family/patient expects to be discharged to:: Private residence Living Arrangements: Spouse/significant other;Children Available Help at Discharge: Family;Available 24 hours/day Type of Home: House Home Access: Stairs to enter Entrance Stairs-Rails: Can reach both Entrance Stairs-Number of Steps: 5 Home Layout: One level Home Equipment: Walker - 2 wheels;Cane - single point      Prior Function Level of Independence: Independent         Comments: pt reports she does not utilize a device to  mobilize, history of falls     Hand Dominance        Extremity/Trunk Assessment   Upper Extremity Assessment Upper Extremity Assessment: Generalized weakness    Lower Extremity Assessment Lower Extremity Assessment: Generalized weakness    Cervical /  Trunk Assessment Cervical / Trunk Assessment: Kyphotic  Communication   Communication: No difficulties  Cognition Arousal/Alertness: Awake/alert Behavior During Therapy: Anxious Overall Cognitive Status: Impaired/Different from baseline Area of Impairment: Orientation;Attention;Memory;Following commands;Safety/judgement;Awareness;Problem solving                 Orientation Level: Disoriented to;Place;Situation Current Attention Level: Sustained Memory: Decreased short-term memory Following Commands: Follows one step commands consistently Safety/Judgement: Decreased awareness of safety;Decreased awareness of deficits Awareness: Intellectual Problem Solving: Slow processing;Requires verbal cues;Requires tactile cues        General Comments General comments (skin integrity, edema, etc.): pt with increase work of breathing with mobility but SpO2 stable in mid to high 90s    Exercises     Assessment/Plan    PT Assessment Patient needs continued PT services  PT Problem List Decreased strength;Decreased activity tolerance;Decreased balance;Decreased mobility;Decreased coordination;Decreased cognition;Decreased knowledge of use of DME;Decreased safety awareness;Decreased knowledge of precautions;Cardiopulmonary status limiting activity       PT Treatment Interventions DME instruction;Gait training;Stair training;Functional mobility training;Therapeutic activities;Therapeutic exercise;Balance training;Neuromuscular re-education;Cognitive remediation;Patient/family education    PT Goals (Current goals can be found in the Care Plan section)  Acute Rehab PT Goals Patient Stated Goal: to improve strength and reduce pain, go home PT Goal Formulation: With patient Time For Goal Achievement: 08/26/20 Potential to Achieve Goals: Good    Frequency Min 3X/week   Barriers to discharge        Co-evaluation               AM-PAC PT "6 Clicks" Mobility  Outcome Measure Help  needed turning from your back to your side while in a flat bed without using bedrails?: A Little Help needed moving from lying on your back to sitting on the side of a flat bed without using bedrails?: A Little Help needed moving to and from a bed to a chair (including a wheelchair)?: A Little Help needed standing up from a chair using your arms (e.g., wheelchair or bedside chair)?: A Little Help needed to walk in hospital room?: A Little Help needed climbing 3-5 steps with a railing? : A Lot 6 Click Score: 17    End of Session   Activity Tolerance: Patient limited by fatigue Patient left: in chair;with call bell/phone within reach;with chair alarm set;with family/visitor present Nurse Communication: Mobility status PT Visit Diagnosis: Unsteadiness on feet (R26.81);Muscle weakness (generalized) (M62.81);History of falling (Z91.81)    Time: 7782-4235 PT Time Calculation (min) (ACUTE ONLY): 20 min   Charges:   PT Evaluation $PT Eval Low Complexity: 1 Low          Arlyss Gandy, PT, DPT Acute Rehabilitation Pager: [phone]   Arlyss Gandy 08/12/2020, 10:12 AM

## 2020-08-12 NOTE — Progress Notes (Signed)
Modified Barium Swallow Progress Note  Patient Details  Name: Erin Lynch MRN: [id number] Date of Birth: October 15, 1960  Today's Date: 08/12/2020  Modified Barium Swallow completed.  Full report located under Chart Review in the Imaging Section.  Brief recommendations include the following:  Clinical Impression  Patient presents with a mild oral, mild-moderate pharyngeal dysphagia with suspected esophageal dysphagia. She exhibited mild delay in mastication and oral transit of regular solids, swallow initiation delays to level of vallecular sinus with all consistencies. She did exhibit flash penetration of thin liquids with larger straw or cup sips, but with full clearance of penetrate. Trace vallecular and pyriform sinus residuals observed with straw sips thin liquids and when taking barium tablet with thin liquids. Esophageal sweep was performed (no radiologist present to confirm) and appearance of dysmotility and stasis of large amount of barium was observed at upper thoracic level of esophagus. No backflow of barium observed at cervical esophageal level. Of note, similar presentation of potential esophageal dysmotility was observed during 11/5 MBS.   Swallow Evaluation Recommendations   Recommended Consults: Consider GI evaluation;Consider esophageal assessment   SLP Diet Recommendations: Dysphagia 3 (Mech soft) solids;Thin liquid   Liquid Administration via: Cup;Straw   Medication Administration: Whole meds with liquid   Supervision: Patient able to self feed;Intermittent supervision to cue for compensatory strategies   Compensations: Minimize environmental distractions;Small sips/bites;Slow rate   Postural Changes: Remain semi-upright after after feeds/meals (Comment);Seated upright at 90 degrees   Oral Care Recommendations: Oral care BID      Angela Nevin, MA, CCC-SLP Speech Therapy [facility]

## 2020-08-12 NOTE — Evaluation (Signed)
Occupational Therapy Evaluation Patient Details Name: Erin Lynch MRN: [id number] DOB: Jul 22, 1960 Today's Date: 08/12/2020    History of Present Illness 60 year old woman with history of recurrent pneumonia, COPD, tobacco use, CAD, HTN, HLD, HFpEF, hypothyroidism, fibromyalgia, bipolar disorder, anxiety who presents with dysarthria, weakness, and cough. Daughter reports the pt was found down, seemingly having a fall when trying to get out of bed. Next day pt found down in kitchen, unable to get up, with altered speech. MRI brain no acute findings   Clinical Impression   Pt PTA: pt living with spouse and reports independence. Pt currently with deficits mostly in cognition and safety with all functional mobility and ADL. Pt requires multimodal cues to attend to task, sequence. Pt currently minguardA overall for mobility with no AD and ADL in standing. Pt performing own toilet hygiene after set-upA. Pt would benefit from continued OT skilled services.  OT following acutely.    Follow Up Recommendations  Home health OT;Supervision/Assistance - 24 hour    Equipment Recommendations  None recommended by OT    Recommendations for Other Services       Precautions / Restrictions Precautions Precautions: Fall;Other (comment) Precaution Comments: airborne precs Restrictions Weight Bearing Restrictions: No      Mobility Bed Mobility Overal bed mobility: Needs Assistance Bed Mobility: Supine to Sit     Supine to sit: Supervision          Transfers Overall transfer level: Needs assistance Equipment used: None Transfers: Sit to/from Stand Sit to Stand: Min guard         General transfer comment: minG for safety, pt impulsively stands while PT buttoning gown    Balance Overall balance assessment: Needs assistance Sitting-balance support: No upper extremity supported;Feet supported Sitting balance-Leahy Scale: Good     Standing balance support: No upper extremity  supported Standing balance-Leahy Scale: Fair                             ADL either performed or assessed with clinical judgement   ADL Overall ADL's : Needs assistance/impaired Eating/Feeding: Supervision/ safety;Set up;Sitting   Grooming: Min guard;Standing   Upper Body Bathing: Min guard;Standing   Lower Body Bathing: Minimal assistance;Sitting/lateral leans;Sit to/from stand   Upper Body Dressing : Min guard;Standing   Lower Body Dressing: Minimal assistance;Sitting/lateral leans;Sit to/from stand   Toilet Transfer: Min guard;Stand-pivot;BSC;RW   Toileting- Architect and Hygiene: Min guard;Sitting/lateral lean;Sit to/from stand       Functional mobility during ADLs: Min guard;Cueing for safety;Cueing for sequencing General ADL Comments: Pt limited by cognitive deficits and decreased ability to care for self safely.     Vision Baseline Vision/History: No visual deficits Patient Visual Report: No change from baseline Vision Assessment?: No apparent visual deficits Additional Comments: continue to assess,but pt avoiding obstacles     Perception     Praxis      Pertinent Vitals/Pain Pain Assessment: No/denies pain Faces Pain Scale: No hurt Pain Intervention(s): Monitored during session     Hand Dominance Right   Extremity/Trunk Assessment Upper Extremity Assessment Upper Extremity Assessment: Generalized weakness;RUE deficits/detail;LUE deficits/detail RUE Deficits / Details: edema, very fragile skin LUE Deficits / Details: edema, very fragile skin   Lower Extremity Assessment Lower Extremity Assessment: Generalized weakness   Cervical / Trunk Assessment Cervical / Trunk Assessment: Kyphotic   Communication Communication Communication: No difficulties   Cognition Arousal/Alertness: Awake/alert Behavior During Therapy: Anxious Overall Cognitive Status: Impaired/Different from baseline Area of  Impairment:  Orientation;Attention;Memory;Following commands;Safety/judgement;Awareness;Problem solving                 Orientation Level: Disoriented to;Place;Situation Current Attention Level: Sustained Memory: Decreased short-term memory Following Commands: Follows one step commands consistently Safety/Judgement: Decreased awareness of safety;Decreased awareness of deficits Awareness: Intellectual Problem Solving: Slow processing;Requires verbal cues;Requires tactile cues General Comments: Pt trying to get OOB at OTRs arrival. Pt requires increased time for ADL tasks and requires directions repeated adn multimodal cues to perform tasks. Pt will say "no" to washing hands, but will participate if soap is applied and water is turned on. Pt A/O x2 to self and date, but not to place, situation.   General Comments  VSS on RA    Exercises     Shoulder Instructions      Home Living Family/patient expects to be discharged to:: Private residence Living Arrangements: Spouse/significant other;Children Available Help at Discharge: Family;Available 24 hours/day Type of Home: House Home Access: Stairs to enter [employer] of Steps: 5 Entrance Stairs-Rails: Can reach both Home Layout: One level               Home Equipment: Walker - 2 wheels;Cane - single point          Prior Functioning/Environment Level of Independence: Independent        Comments: pt reports she does not utilize a device to mobilize, history of falls        OT Problem List: Decreased activity tolerance;Decreased cognition;Decreased safety awareness      OT Treatment/Interventions: Self-care/ADL training;Therapeutic exercise;Energy conservation;DME and/or AE instruction;Therapeutic activities;Cognitive remediation/compensation;Patient/family education;Balance training    OT Goals(Current goals can be found in the care plan section) Acute Rehab OT Goals Patient Stated Goal: to improve strength and  reduce pain, go home OT Goal Formulation: With patient Time For Goal Achievement: 08/26/20 Potential to Achieve Goals: Good ADL Goals Pt Will Perform Lower Body Dressing: with supervision;sit to/from stand Additional ADL Goal #1: Pt will participate in higher level cognitive deficits in order to continue to assess for safety. Additional ADL Goal #2: Pt will participate x15 mins with OOB ADL tasks with 1 seated rest break in order to increase endurance for ADL.  OT Frequency: Min 2X/week   Barriers to D/C:            Co-evaluation              AM-PAC OT "6 Clicks" Daily Activity     Outcome Measure Help from another person eating meals?: A Little Help from another person taking care of personal grooming?: A Little Help from another person toileting, which includes using toliet, bedpan, or urinal?: A Little Help from another person bathing (including washing, rinsing, drying)?: A Little Help from another person to put on and taking off regular upper body clothing?: A Little Help from another person to put on and taking off regular lower body clothing?: A Little 6 Click Score: 18   End of Session Equipment Utilized During Treatment: Gait belt Nurse Communication: Mobility status  Activity Tolerance: Patient tolerated treatment well Patient left: in bed;with call bell/phone within reach;with bed alarm set  OT Visit Diagnosis: Unsteadiness on feet (R26.81);Other symptoms and signs involving cognitive function                Time: 1152-1209 OT Time Calculation (min): 17 min Charges:  OT General Charges $OT Visit: 1 Visit OT Evaluation $OT Eval Moderate Complexity: 1 Mod  Flora Lipps, OTR/L Acute Rehabilitation Services Pager:  [phone] Office: [phone]   Shannan Slinker C 08/12/2020, 2:23 PM

## 2020-08-12 NOTE — Evaluation (Signed)
Clinical/Bedside Swallow Evaluation Patient Details  Name: Erin Lynch MRN: [id number] Date of Birth: [birth year]  Today's Date: 08/12/2020 Time: SLP Start Time (ACUTE ONLY): 0930 SLP Stop Time (ACUTE ONLY): 1000 SLP Time Calculation (min) (ACUTE ONLY): 30 min  Past Medical History:  Past Medical History:  Diagnosis Date  . Anxiety   . Arthritis   . Bulging disc   . CAD (coronary artery disease)    a. NSTEMI 12/2011: BMS-LAD b. 09/2015: cath showing patent stent w/ mild nonobstructive CAD.  Marland Kitchen Chronic diastolic CHF (congestive heart failure) (HCC)   . Depression   . Fibromyalgia   . GERD (gastroesophageal reflux disease)   . Hyperlipidemia LDL goal < 70   . Hypertension   . Hypothyroid   . Obesity (BMI 30.0-34.9)    Past Surgical History:  Past Surgical History:  Procedure Laterality Date  . CARDIAC CATHETERIZATION  01/01/2012    WITH CORONARY ANGIOGRAM, left heart   . CARDIAC CATHETERIZATION N/A 10/11/2015   Procedure: Left Heart Cath and Coronary Angiography;  Surgeon: Tonny Bollman, MD;  Location: [facility] INVASIVE CV LAB;  Service: Cardiovascular;  Laterality: N/A;  . CORONARY STENT PLACEMENT    . LEFT HEART CATHETERIZATION WITH CORONARY ANGIOGRAM N/A 01/01/2012   Procedure: LEFT HEART CATHETERIZATION WITH CORONARY ANGIOGRAM;  Surgeon: Iran Ouch, MD;  Location: [facility] CATH LAB;  Service: Cardiovascular;  Laterality: N/A;   HPI:  60 year old woman with history of recurrent pneumonia, COPD, tobacco use, CAD, HTN, HLD, HFpEF, hypothyroidism, fibromyalgia, bipolar disorder, anxiety who presents with dysarthria, weakness, and cough. Daughter reports the pt was found down, seemingly having a fall when trying to get out of bed. Next day pt found down in kitchen, unable to get up, with altered speech.   Assessment / Plan / Recommendation Clinical Impression  Patient presents with a mild oropharyngeal dysphagia without overt s/s aspiration or penetration but with slightly prolonged  mastication with regular solids. In addition, patient has h/o recurrant PNA, GERD, dysphagia [facility]: initial MBS recommending nectar liquids, most recent 11/5 MBS recommending upgrade to thin liquids), reported recent h/o regurgitation of solids at night. SLP at [facility] was also recommending GI consult and this was going to be completed as outpatient but does not apppear to have been completed. Patient reported she cancelled it because she wasn't having any issues. Recommending patient have repeat MBS here at [facility] and MD and SLP in agreement to determine if need for GI consult following MBS planned for today. SLP Visit Diagnosis: Dysphagia, unspecified (R13.10)    Aspiration Risk  Mild aspiration risk    Diet Recommendation Dysphagia 3 (Mech soft);Thin liquid   Liquid Administration via: Cup;Straw Medication Administration: Whole meds with liquid Supervision: Patient able to self feed;Intermittent supervision to cue for compensatory strategies Compensations: Minimize environmental distractions;Small sips/bites;Slow rate Postural Changes: Seated upright at 90 degrees;Remain upright for at least 30 minutes after po intake    Other  Recommendations Recommended Consults: Consider GI evaluation;Consider esophageal assessment Oral Care Recommendations: Oral care BID   Follow up Recommendations Other (comment) (TBD pending MBS results)      Frequency and Duration min 1 x/week  1 week       Prognosis Prognosis for Safe Diet Advancement: Good      Swallow Study   General Date of Onset: 08/12/20 HPI: 60 year old woman with history of recurrent pneumonia, COPD, tobacco use, CAD, HTN, HLD, HFpEF, hypothyroidism, fibromyalgia, bipolar disorder, anxiety who presents with dysarthria, weakness, and cough. Daughter reports the pt  was found down, seemingly having a fall when trying to get out of bed. Next day pt found down in kitchen, unable to get up, with altered speech. Type of Study:  Bedside Swallow Evaluation Previous Swallow Assessment: 11/5 MBS at [facility] Diet Prior to this Study: Regular;Thin liquids Temperature Spikes Noted: No Respiratory Status: Room air History of Recent Intubation: No Behavior/Cognition: Alert;Cooperative;Pleasant mood;Confused Oral Cavity Assessment: Within Functional Limits Oral Care Completed by SLP: Yes Oral Cavity - Dentition: Adequate natural dentition Vision: Functional for self-feeding Self-Feeding Abilities: Able to feed self Patient Positioning: Upright in bed Baseline Vocal Quality: Normal Volitional Cough: Strong Volitional Swallow: Able to elicit    Oral/Motor/Sensory Function Overall Oral Motor/Sensory Function: Within functional limits   Ice Chips     Thin Liquid Thin Liquid: Within functional limits Presentation: Straw;Self Fed    Nectar Thick     Honey Thick     Puree Puree: Within functional limits   Solid     Solid: Impaired Oral Phase Impairments: Impaired mastication Oral Phase Functional Implications: Impaired mastication;Prolonged oral transit     Angela Nevin, MA, CCC-SLP Speech Therapy [facility] Acute Rehab

## 2020-08-12 NOTE — Plan of Care (Signed)

## 2020-08-12 NOTE — Consult Note (Signed)
Consult Note for [facility]  Reason for Consult: ? Esophagea dysphagia Referring Physician: Teaching Service  Erin Lynch HPI: This is a 60 year old female with a PMH of GERD, CAD, hyperlipidemia, HTN, and recurrent aspiration pneumonia admitted for cough and wheezing.  Upon initial presentation was was noted to be tachypneic with a leukocytosis.  A CTA revealed a bilateral multifocal pneumonia.  With a history of recurrent aspiration pneumonia GI was requested for further evaluation.  She was evaluated by Speech Pathology and she was noted to have a mild to moderate oropharyngeal dysphagia, however, there was a possible esophageal component.  The patient denies any issues with dysphagia during PO intake, but he recalls undergoing two endoscopies in the past.  She states that one of the procedures was performed at [facility], but there was no record of this procedure.    Past Medical History:  Diagnosis Date  . Anxiety   . Arthritis   . Bulging disc   . CAD (coronary artery disease)    a. NSTEMI 12/2011: BMS-LAD b. 09/2015: cath showing patent stent w/ mild nonobstructive CAD.  Marland Kitchen Chronic diastolic CHF (congestive heart failure) (HCC)   . Depression   . Fibromyalgia   . GERD (gastroesophageal reflux disease)   . Hyperlipidemia LDL goal < 70   . Hypertension   . Hypothyroid   . Obesity (BMI 30.0-34.9)     Past Surgical History:  Procedure Laterality Date  . CARDIAC CATHETERIZATION  01/01/2012    WITH CORONARY ANGIOGRAM, left heart   . CARDIAC CATHETERIZATION N/A 10/11/2015   Procedure: Left Heart Cath and Coronary Angiography;  Surgeon: Tonny Bollman, MD;  Location: [facility] INVASIVE CV LAB;  Service: Cardiovascular;  Laterality: N/A;  . CORONARY STENT PLACEMENT    . LEFT HEART CATHETERIZATION WITH CORONARY ANGIOGRAM N/A 01/01/2012   Procedure: LEFT HEART CATHETERIZATION WITH CORONARY ANGIOGRAM;  Surgeon: Iran Ouch, MD;  Location: [facility] CATH LAB;  Service: Cardiovascular;  Laterality: N/A;     Family History  Problem Relation Age of Onset  . Heart attack Mother   . Heart attack Father     Social History:  reports that she has been smoking cigarettes. She has a 25.00 pack-year smoking history. She has never used smokeless tobacco. She reports that she does not drink alcohol and does not use drugs.  Allergies: No Known Allergies  Medications:  Scheduled: . enoxaparin (LOVENOX) injection  40 mg Subcutaneous Q24H  . famotidine  20 mg Oral Daily  . fluticasone furoate-vilanterol  1 puff Inhalation Daily  . levothyroxine  125 mcg Oral Q0600  . lurasidone  40 mg Oral Daily  . nicotine  21 mg Transdermal Daily  . rosuvastatin  10 mg Oral Daily  . umeclidinium bromide  1 puff Inhalation Daily  . venlafaxine XR  75 mg Oral Q breakfast   Continuous: . sodium chloride 10 mL/hr at 08/12/20 0416  . ampicillin-sulbactam (UNASYN) IV 3 g (08/12/20 1419)    Results for orders placed or performed during the hospital encounter of 08/11/20 (from the past 24 hour(s))  Basic metabolic panel     Status: Abnormal   Collection Time: 08/12/20 12:49 AM  Result Value Ref Range   Sodium 139 135 - 145 mmol/L   Potassium 3.1 (L) 3.5 - 5.1 mmol/L   Chloride 108 98 - 111 mmol/L   CO2 23 22 - 32 mmol/L   Glucose, Bld 88 70 - 99 mg/dL   BUN 9 8 - 23 mg/dL  Creatinine, Ser 0.64 0.44 - 1.00 mg/dL   Calcium 8.2 (L) 8.9 - 10.3 mg/dL   GFR, Estimated >32 >20 mL/min   Anion gap 8 5 - 15  CBC     Status: Abnormal   Collection Time: 08/12/20 12:49 AM  Result Value Ref Range   WBC 14.8 (H) 4.0 - 10.5 K/uL   RBC 3.42 (L) 3.87 - 5.11 MIL/uL   Hemoglobin 10.2 (L) 12.0 - 15.0 g/dL   HCT 25.4 (L) 27.0 - 62.3 %   MCV 94.2 80.0 - 100.0 fL   MCH 29.8 26.0 - 34.0 pg   MCHC 31.7 30.0 - 36.0 g/dL   RDW 76.2 (H) 83.1 - 51.7 %   Platelets 295 150 - 400 K/uL   nRBC 0.0 0.0 - 0.2 %  TSH     Status: None   Collection Time: 08/12/20 12:49 AM  Result Value Ref Range   TSH 1.046 0.350 - 4.500 uIU/mL   Procalcitonin - Baseline     Status: None   Collection Time: 08/12/20 12:49 AM  Result Value Ref Range   Procalcitonin 0.24 ng/mL     DG Chest 2 View  Result Date: 08/11/2020 CLINICAL DATA:  Dysphagia EXAM: CHEST - 2 VIEW COMPARISON:  08/11/2020 FINDINGS: Elevation of the right hemidiaphragm. Right base atelectasis or scarring. Heart is normal size. Left lung clear. No effusions or acute bony abnormality. IMPRESSION: Chronic elevation of the right hemidiaphragm with right base atelectasis or scarring. No active disease. Electronically Signed   By: Charlett Nose M.D.   On: 08/11/2020 18:45   CT Head Wo Contrast  Result Date: 08/11/2020 CLINICAL DATA:  Slurred speech, hypotensive EXAM: CT HEAD WITHOUT CONTRAST TECHNIQUE: Contiguous axial images were obtained from the base of the skull through the vertex without intravenous contrast. COMPARISON:  02/24/2020 FINDINGS: Mild motion degradation. Brain: No evidence of acute infarction, hemorrhage, hydrocephalus, extra-axial collection or mass lesion/mass effect. Vascular: No hyperdense vessel or unexpected calcification. Skull: Normal. Negative for fracture or focal lesion. Sinuses/Orbits: The visualized paranasal sinuses are essentially clear. The mastoid air cells are unopacified. Other: None. IMPRESSION: Normal head CT. Electronically Signed   By: Charline Bills M.D.   On: 08/11/2020 12:34   CT ANGIO CHEST PE W OR WO CONTRAST  Result Date: 08/11/2020 CLINICAL DATA:  Difficulty breathing and positive D-dimer EXAM: CT ANGIOGRAPHY CHEST WITH CONTRAST TECHNIQUE: Multidetector CT imaging of the chest was performed using the standard protocol during bolus administration of intravenous contrast. Multiplanar CT image reconstructions and MIPs were obtained to evaluate the vascular anatomy. CONTRAST:  63 mL OMNIPAQUE IOHEXOL 350 MG/ML SOLN COMPARISON:  Chest x-ray from earlier in the same day CT from 04/10/2020. FINDINGS: Cardiovascular: Thoracic aorta and its  branches are well visualized. No aneurysmal dilatation is noted. Mild soft atherosclerotic plaque is noted in the descending aorta. No dissection is seen. No cardiac enlargement is noted. The pulmonary artery shows a normal branching pattern bilaterally. No filling defects are identified to suggest pulmonary embolism. Coronary calcifications are noted. Mediastinum/Nodes: Thoracic inlet is within normal limits. Scattered small hilar and mediastinal lymph nodes are noted likely reactive in nature. The esophagus as visualized is within normal limits. Lungs/Pleura: Lungs are well aerated bilaterally. Patchy bibasilar infiltrate is noted right greater than left without sizable effusion. Some patchy nodular changes are noted in the right upper lobe new from the prior CT from 04/10/2020 also consistent with postinflammatory change. Upper Abdomen: Visualized upper abdomen shows no acute abnormality. Musculoskeletal: No chest wall  abnormality. No acute or significant osseous findings. Review of the MIP images confirms the above findings. IMPRESSION: Patchy bilateral infiltrates right greater than left consistent with multifocal pneumonia. Previously seen changes in the left upper lobe on the prior CT have resolved in the interval. No evidence of pulmonary emboli. Electronically Signed   By: Alcide CleverMark  Lukens M.D.   On: 08/11/2020 20:57   MR BRAIN WO CONTRAST  Result Date: 08/12/2020 CLINICAL DATA:  Stroke follow-up.  Slurred speech and hypotension. EXAM: MRI HEAD WITHOUT CONTRAST TECHNIQUE: Multiplanar, multiecho pulse sequences of the brain and surrounding structures were obtained without intravenous contrast. COMPARISON:  Head CT from yesterday FINDINGS: Brain: No acute infarction, hemorrhage, hydrocephalus, extra-axial collection or mass lesion. Age normal brain volume. Vascular: Normal flow voids. Skull and upper cervical spine: Normal marrow signal. Sinuses/Orbits: Nasal septal perforation.  No sinusitis. Other: Moderate  motion artifact. IMPRESSION: Motion degraded study without infarct or other acute finding. Electronically Signed   By: Marnee SpringJonathon  Watts M.D.   On: 08/12/2020 06:22   DG Chest Port 1 View  Result Date: 08/11/2020 CLINICAL DATA:  Cough.  Possible sepsis. EXAM: PORTABLE CHEST 1 VIEW COMPARISON:  Chest x-ray dated April 26, 2020. FINDINGS: The heart size and mediastinal contours are within normal limits. Normal pulmonary vascularity. Diffusely increased interstitial markings compared to the prior study. Small right pleural effusion. No consolidation or pneumothorax. Elevated right hemidiaphragm with mild right basilar atelectasis. No acute osseous abnormality. IMPRESSION: 1. Mild interstitial pulmonary edema and small right pleural effusion. Electronically Signed   By: Obie DredgeWilliam T Derry M.D.   On: 08/11/2020 11:04   DG Abd Portable 1V  Result Date: 08/12/2020 CLINICAL DATA:  Clearance for MRI EXAM: PORTABLE ABDOMEN - 1 VIEW COMPARISON:  None. FINDINGS: Partial exclusion of the abdomen on the left, which was encompassed on the scanogram from preceding chest CT. No metallic foreign body. Normal bowel gas. No concerning mass effect or gas collection. IMPRESSION: Negative for metallic foreign body. Electronically Signed   By: Marnee SpringJonathon  Watts M.D.   On: 08/12/2020 05:48   DG Swallowing Func-Speech Pathology  Result Date: 08/12/2020 Objective Swallowing Evaluation: Type of Study: Bedside Swallow Evaluation  Patient Details Name: Erin JudeJoan Lynch MRN: [id number] Date of Birth: 09/07/1960 Today's Date: 08/12/2020 Time: SLP Start Time (ACUTE ONLY): 1110 -SLP Stop Time (ACUTE ONLY): 1130 SLP Time Calculation (min) (ACUTE ONLY): 20 min Past Medical History: Past Medical History: Diagnosis Date . Anxiety  . Arthritis  . Bulging disc  . CAD (coronary artery disease)   a. NSTEMI 12/2011: BMS-LAD b. 09/2015: cath showing patent stent w/ mild nonobstructive CAD. Marland Kitchen. Chronic diastolic CHF (congestive heart failure) (HCC)  . Depression  .  Fibromyalgia  . GERD (gastroesophageal reflux disease)  . Hyperlipidemia LDL goal < 70  . Hypertension  . Hypothyroid  . Obesity (BMI 30.0-34.9)  Past Surgical History: Past Surgical History: Procedure Laterality Date . CARDIAC CATHETERIZATION  01/01/2012   WITH CORONARY ANGIOGRAM, left heart  . CARDIAC CATHETERIZATION N/A 10/11/2015  Procedure: Left Heart Cath and Coronary Angiography;  Surgeon: Tonny BollmanMichael Cooper, MD;  [facility];  Service: Cardiovascular;  Laterality: N/A; . CORONARY STENT PLACEMENT   . LEFT HEART CATHETERIZATION WITH CORONARY ANGIOGRAM N/A 01/01/2012  Procedure: LEFT HEART CATHETERIZATION WITH CORONARY ANGIOGRAM;  Surgeon: Iran OuchMuhammad A Arida, MD;  Location: [facility] CATH LAB;  Service: Cardiovascular;  Laterality: N/A; HPI: 60 year old woman with history of recurrent pneumonia, COPD, tobacco use, CAD, HTN, HLD, HFpEF, hypothyroidism, fibromyalgia, bipolar disorder, anxiety who presents  with dysarthria, weakness, and cough. Daughter reports the pt was found down, seemingly having a fall when trying to get out of bed. Next day pt found down in kitchen, unable to get up, with altered speech.  Subjective: pleasant, cooperative Assessment / Plan / Recommendation CHL IP CLINICAL IMPRESSIONS 08/12/2020 Clinical Impression Patient presents with a mild oral, mild-moderate pharyngeal dysphagia with suspected esophageal dysphagia. She exhibited mild delay in mastication and oral transit of regular solids, swallow initiation delays to level of vallecular sinus with all consistencies. She did exhibit flash penetration of thin liquids with larger straw or cup sips, but with full clearance of penetrate. Trace vallecular and pyriform sinus residuals observed with straw sips thin liquids and when taking barium tablet with thin liquids. Esophageal sweep was performed (no radiologist present to confirm) and appearance of dysmotility and stasis of large amount of barium was observed at upper thoracic level of  esophagus. No backflow of barium observed at cervical esophageal level. Of note, similar presentation of potential esophageal dysmotility was observed during 11/5 MBS. SLP Visit Diagnosis Dysphagia, oropharyngeal phase (R13.12);Dysphagia, pharyngoesophageal phase (R13.14) Attention and concentration deficit following -- Frontal lobe and executive function deficit following -- Impact on safety and function Mild aspiration risk   CHL IP TREATMENT RECOMMENDATION 08/12/2020 Treatment Recommendations Therapy as outlined in treatment plan below   Prognosis 08/12/2020 Prognosis for Safe Diet Advancement Good Barriers to Reach Goals -- Barriers/Prognosis Comment -- CHL IP DIET RECOMMENDATION 08/12/2020 SLP Diet Recommendations Dysphagia 3 (Mech soft) solids;Thin liquid Liquid Administration via Cup;Straw Medication Administration Whole meds with liquid Compensations Minimize environmental distractions;Small sips/bites;Slow rate Postural Changes Remain semi-upright after after feeds/meals (Comment);Seated upright at 90 degrees   CHL IP OTHER RECOMMENDATIONS 08/12/2020 Recommended Consults Consider GI evaluation;Consider esophageal assessment Oral Care Recommendations Oral care BID Other Recommendations --   CHL IP FOLLOW UP RECOMMENDATIONS 08/12/2020 Follow up Recommendations None   CHL IP FREQUENCY AND DURATION 08/12/2020 Speech Therapy Frequency (ACUTE ONLY) min 1 x/week Treatment Duration 1 week      CHL IP ORAL PHASE 08/12/2020 Oral Phase Impaired Oral - Pudding Teaspoon -- Oral - Pudding Cup -- Oral - Honey Teaspoon -- Oral - Honey Cup -- Oral - Nectar Teaspoon -- Oral - Nectar Cup -- Oral - Nectar Straw -- Oral - Thin Teaspoon -- Oral - Thin Cup Premature spillage Oral - Thin Straw Premature spillage Oral - Puree Delayed oral transit;Reduced posterior propulsion Oral - Mech Soft -- Oral - Regular Impaired mastication;Delayed oral transit;Reduced posterior propulsion Oral - Multi-Consistency -- Oral - Pill Delayed oral  transit;Reduced posterior propulsion Oral Phase - Comment --  CHL IP PHARYNGEAL PHASE 08/12/2020 Pharyngeal Phase -- Pharyngeal- Pudding Teaspoon -- Pharyngeal -- Pharyngeal- Pudding Cup -- Pharyngeal -- Pharyngeal- Honey Teaspoon -- Pharyngeal -- Pharyngeal- Honey Cup -- Pharyngeal -- Pharyngeal- Nectar Teaspoon -- Pharyngeal -- Pharyngeal- Nectar Cup -- Pharyngeal -- Pharyngeal- Nectar Straw -- Pharyngeal -- Pharyngeal- Thin Teaspoon -- Pharyngeal -- Pharyngeal- Thin Cup -- Pharyngeal -- Pharyngeal- Thin Straw Pharyngeal residue - valleculae;Pharyngeal residue - pyriform Pharyngeal -- Pharyngeal- Puree -- Pharyngeal -- Pharyngeal- Mechanical Soft -- Pharyngeal -- Pharyngeal- Regular -- Pharyngeal -- Pharyngeal- Multi-consistency -- Pharyngeal -- Pharyngeal- Pill -- Pharyngeal -- Pharyngeal Comment --  CHL IP CERVICAL ESOPHAGEAL PHASE 08/12/2020 Cervical Esophageal Phase WFL Pudding Teaspoon -- Pudding Cup -- Honey Teaspoon -- Honey Cup -- Nectar Teaspoon -- Nectar Cup -- Nectar Straw -- Thin Teaspoon -- Thin Cup -- Thin Straw -- Puree -- Mechanical Soft -- Regular --  Multi-consistency -- Pill -- Cervical Esophageal Comment -- Angela Nevin, MA, CCC-SLP Speech Therapy [facility]              ROS:  As stated above in the HPI otherwise negative.  Blood pressure 132/62, pulse 80, temperature 97.7 F (36.5 C), temperature source Oral, resp. rate 20, height 5' 3.6" (1.615 m), weight 82.2 kg, SpO2 92 %.    PE: Gen: NAD, Alert and Oriented HEENT:  [place]/AT, EOMI Neck: Supple, no LAD Lungs: Coarse breath sounds in the bases bilaterally CV: RRR without M/G/R ABD: Soft, NTND, +BS Ext: No C/C/E  Assessment/Plan: 1) ? Esophageal dysphagia. 2) Recurrent aspiration pneumonia. 3) CAD.   The Speech report also notes that there was stasis of some barium in the thoracic esophagus suspicious for a motility component.  Overtly she does not report any problems with dysphagia when engaging in PO intake.  It is  reasonable to perform an EGD with dilation.  Sometimes the dilation can unmask a subtle stricture and/or improve dysmotility.    Plan: 1) EGD with dilation tomorrow. 2) Hold Lovenox.  Ashton Belote D 08/12/2020, 5:17 PM

## 2020-08-12 NOTE — Progress Notes (Signed)
Date: 08/12/2020  Patient name: Erin Lynch  Medical record number: [id number]  Date of birth: 1960-10-27   I have seen and evaluated Lynnell Jude and discussed their care with the Residency Team.  In brief: Patient is 60 year old female with a past medical history of recurrent pneumonia with COPD, tobacco use, CAD, hypertension, hyperlipidemia, chronic diastolic heart failure, hypothyroidism, fibromyalgia, bipolar disorder and anxiety who presented to the ED with weakness and cough over the last day.  History obtained from chart and patient.  On the day prior to admission, patient daughter found the patient lying on the floor of her bedroom after reportedly falling while trying to go to bed.  Patient appeared to be sleepier and weaker and had difficulty ambulating.  Patient was also noted to not eat or drink as much as her baseline.  Patient is also noted to have wheezing and persistent cough.  On the day of admission, patient was found lying on the floor in the kitchen with a white foam around her mouth.  Patient speech was unintelligible and her blood pressure was low so EMS was called.  Patient states that she normally gets admitted for aspiration pneumonia secondary to difficulty swallowing.  She states that she feels like her speech is much better today than on admission.  No chest pain, no palpitations, no diaphoresis, no lightheadedness, no focal weakness, no tingling or numbness, no nausea or vomiting, no diarrhea, no abdominal pain.  Today patient is complaining of mild pain in her lower abdomen and her back consistent with her fibromyalgia pain.  She denies any other complaints currently.  PMHx, Fam Hx, and/or Soc Hx : As per resident admit note  Vitals:   08/12/20 0806 08/12/20 0856  BP:  131/64  Pulse:  78  Resp:  20  Temp:  97.7 F (36.5 C)  SpO2: 93% 95%   General: Awake, alert, oriented x3, NAD CVS: Regular in rhythm, normal heart sounds Lungs: Bilateral coarse breath sounds  noted worse in the right lung Abdomen: Soft, nontender, nondistended, normoactive bowel sounds Extremities: No edema noted, nontender to palpation Psych: Normal mood and affect HEENT: Normocephalic, atraumatic Neuro: Oriented x3, no focal weakness noted, no dysarthria noted on exam today  Assessment and Plan: I have seen and evaluated the patient as outlined above. I agree with the formulated Assessment and Plan as detailed in the residents' note, with the following changes:   1.  Sepsis secondary to multifocal pneumonia: -Patient presented to the ED with worsening weakness, persistent cough and wheezing and was found to be hypotensive on admission with SBP in the 70s as well as tachypneic with respiratory rate in the 20s (up to 24) with leukocytosis up to 16.8 consistent with sepsis.  Initial chest x-ray did not show evidence of infiltrate but patient did have a CTA chest which showed no evidence of PE but infiltrates consistent with bilateral multifocal pneumonia worse on the right from the left. -Patient procalcitonin was within normal limits indicating this is likely a viral pneumonia.  However, patient does have a history of recurrent aspiration pneumonia and does admit to coughing up some undigested food a couple of days prior to admission.  Would continue with IV antibiotics for now (transition to Unasyn) -Covid test was negative.  Would follow-up respiratory viral panel -Patient's hypotension has now resolved.  We will follow up blood cultures.   -SLP follow-up and recommendations appreciated.  Patient had an MBS done today which showed mild to moderate pharyngeal dysphagia with suspected  esophageal dysphagia.  Will obtain GI evaluation for further work-up and possible EGD -Continue dysphagia 3 diet for now -Patient's leukocytosis has improved to 14.8 today.  We will continue to monitor -Patient also noted to have mild AKI on admission with creatinine 1.06 which improved to 0.64 today with  IV fluids.  I suspect this was likely secondary to underlying sepsis as well as dehydration secondary to decreased oral intake -Follow PT/OT evaluation -No further work-up at this time.  We will continue to monitor closely.  Earl Lagos, MD 1/29/202212:53 PM

## 2020-08-12 NOTE — Progress Notes (Signed)
HD#1 Subjective:   Admitted yesterday evening. No acute events overnight.  During evaluation at bedside this morning, patient states that she did not sleep at all last night. She states that she did sleep talk throughout the night. She was able to tolerate her PT. She endorses mild pain in her lower abdomen and back.  She states that she came in to the hospital for possible pneumonia and "weird speech" that is getting better but "not quite" back to baseline.  Objective:   Vital signs in last 24 hours: Vitals:   08/11/20 1958 08/11/20 2001 08/12/20 0020 08/12/20 0410  BP:  137/71 123/73 132/67  Pulse:  81 85 83  Resp:  18 17 17   Temp:  98.7 F (37.1 C) 98.8 F (37.1 C) 98.4 F (36.9 C)  TempSrc:  Oral Oral Oral  SpO2:  94% 94% 94%  Weight: 82.2 kg     Height: 5' 3.6" (1.615 m)      Supplemental O2: None   Physical Exam Constitutional: chronically ill-appearing obese woman lying in bed, in no acute distress, appears older than stated age HENT: normocephalic atraumatic, mucous membranes less moist; yellow-orange substance on R side of face at corner of mouth Cardiovascular: regular rate and rhythm, no m/r/g; no lower extremity edema Pulmonary/Chest: normal work of breathing on room air; no wheezes or rales; bilateral coarse breath sounds R>L Abdominal: soft, non-distended Neurological: alert & oriented to person, place, and time; significantly improved dysarthria; no facial droop; moves all extremities equally; sensation grossly intact Skin: warm and dry, improved skin turgor Psych: appropriate affect, speech is tangential, occasional mistakes with word choice with subsequent self-correction  Filed Weights   08/11/20 1018 08/11/20 1958  Weight: 78.5 kg 82.2 kg    Intake/Output Summary (Last 24 hours) at 08/12/2020 0606 Last data filed at 08/12/2020 0520 Gross per 24 hour  Intake 2370 ml  Output --  Net 2370 ml   Net IO Since Admission: 2,370 mL [08/12/20  0606]  Pertinent Labs: CBC Latest Ref Rng & Units 08/12/2020 08/11/2020 08/11/2020  WBC 4.0 - 10.5 K/uL 14.8(H) - -  Hemoglobin 12.0 - 15.0 g/dL 10.2(L) 10.5(L) 9.9(L)  Hematocrit 36.0 - 46.0 % 32.2(L) 31.0(L) 29.0(L)  Platelets 150 - 400 K/uL 295 - -    CMP Latest Ref Rng & Units 08/12/2020 08/11/2020 08/11/2020  Glucose 70 - 99 mg/dL 88 - -  BUN 8 - 23 mg/dL 9 - -  Creatinine 08/13/2020 - 1.00 mg/dL 4.09 - -  Sodium 7.35 - 145 mmol/L 139 145 143  Potassium 3.5 - 5.1 mmol/L 3.1(L) 3.5 3.0(L)  Chloride 98 - 111 mmol/L 108 - -  CO2 22 - 32 mmol/L 23 - -  Calcium 8.9 - 10.3 mg/dL 8.2(L) - -  Total Protein 6.5 - 8.1 g/dL - - -  Total Bilirubin 0.3 - 1.2 mg/dL - - -  Alkaline Phos 38 - 126 U/L - - -  AST 15 - 41 U/L - - -  ALT 0 - 44 U/L - - -    Imaging:  08/11/20 DG Chest 2 View  CLINICAL DATA:  Dysphagia EXAM: CHEST - 2 VIEW COMPARISON:  08/11/2020 FINDINGS: Elevation of the right hemidiaphragm. Right base atelectasis or scarring. Heart is normal size. Left lung clear. No effusions or acute bony abnormality. IMPRESSION: Chronic elevation of the right hemidiaphragm with right base atelectasis or scarring. No active disease. Electronically Signed   By: 08/13/2020 M.D.   On: 08/11/2020 18:45  08/11/20 CT ANGIO CHEST PE W OR WO CONTRAST  CLINICAL DATA:  Difficulty breathing and positive D-dimer EXAM: CT ANGIOGRAPHY CHEST WITH CONTRAST TECHNIQUE: Multidetector CT imaging of the chest was performed using the standard protocol during bolus administration of intravenous contrast. Multiplanar CT image reconstructions and MIPs were obtained to evaluate the vascular anatomy. CONTRAST:  63 mL OMNIPAQUE IOHEXOL 350 MG/ML SOLN COMPARISON:  Chest x-ray from earlier in the same day CT from 04/10/2020. FINDINGS: Cardiovascular: Thoracic aorta and its branches are well visualized. No aneurysmal dilatation is noted. Mild soft atherosclerotic plaque is noted in the descending aorta. No dissection is seen. No  cardiac enlargement is noted. The pulmonary artery shows a normal branching pattern bilaterally. No filling defects are identified to suggest pulmonary embolism. Coronary calcifications are noted. Mediastinum/Nodes: Thoracic inlet is within normal limits. Scattered small hilar and mediastinal lymph nodes are noted likely reactive in nature. The esophagus as visualized is within normal limits. Lungs/Pleura: Lungs are well aerated bilaterally. Patchy bibasilar infiltrate is noted right greater than left without sizable effusion. Some patchy nodular changes are noted in the right upper lobe new from the prior CT from 04/10/2020 also consistent with postinflammatory change. Upper Abdomen: Visualized upper abdomen shows no acute abnormality. Musculoskeletal: No chest wall abnormality. No acute or significant osseous findings. Review of the MIP images confirms the above findings. IMPRESSION: Patchy bilateral infiltrates right greater than left consistent with multifocal pneumonia. Previously seen changes in the left upper lobe on the prior CT have resolved in the interval. No evidence of pulmonary emboli. Electronically Signed   By: Alcide Clever M.D.   On: 08/11/2020 20:57   08/12/20 MR BRAIN WO CONTRAST  CLINICAL DATA:  Stroke follow-up.  Slurred speech and hypotension. EXAM: MRI HEAD WITHOUT CONTRAST TECHNIQUE: Multiplanar, multiecho pulse sequences of the brain and surrounding structures were obtained without intravenous contrast. COMPARISON:  Head CT from yesterday FINDINGS: Brain: No acute infarction, hemorrhage, hydrocephalus, extra-axial collection or mass lesion. Age normal brain volume. Vascular: Normal flow voids. Skull and upper cervical spine: Normal marrow signal. Sinuses/Orbits: Nasal septal perforation.  No sinusitis. Other: Moderate motion artifact. IMPRESSION: Motion degraded study without infarct or other acute finding. Electronically Signed   By: Marnee Spring M.D.   On: 08/12/2020 06:22    08/12/20 DG Swallowing Func-Speech Pathology  Objective Swallowing Evaluation: Type of Study: Bedside Swallow Evaluation   08/12/2020 Clinical Impression Patient presents with a mild oral, mild-moderate pharyngeal dysphagia with suspected esophageal dysphagia. She exhibited mild delay in mastication and oral transit of regular solids, swallow initiation delays to level of vallecular sinus with all consistencies. She did exhibit flash penetration of thin liquids with larger straw or cup sips, but with full clearance of penetrate. Trace vallecular and pyriform sinus residuals observed with straw sips thin liquids and when taking barium tablet with thin liquids. Esophageal sweep was performed (no radiologist present to confirm) and appearance of dysmotility and stasis of large amount of barium was observed at upper thoracic level of esophagus. No backflow of barium observed at cervical esophageal level. Of note, similar presentation of potential esophageal dysmotility was observed during 11/5 MBS. SLP Visit Diagnosis Dysphagia, oropharyngeal phase (R13.12);Dysphagia, pharyngoesophageal phase (R13.14) Attention and concentration deficit following -- Frontal lobe and executive function deficit following -- Impact on safety and function Mild aspiration risk       Assessment/Plan:   Active Problems:   Weakness   Patient Summary:  Ms. Erin Lynch is a 60 year old woman with history of recurrent  pneumonia, COPD, tobacco use, CAD, HTN, HLD, HFpEF, hypothyroidism, fibromyalgia, bipolar disorder, anxiety who presents with dysarthria, weakness, and cough and admitted for further work-up and evaluation.  This is hospital day 1.  Sepsis secondary to multifocal pneumonia  History of recurrent aspiration pneumonia Pharyngeal dysphagia, potential esophageal dysmotility  Patient minimally dysarthric this morning, reports feeling close to her baseline, ongoing productive cough. Continues to be afebrile, no longer  tachypneic, hypotension resolved with BP 120-130s/60s-70s, on room air maintaining O2 sats >94%. Leukocytosis improved from 16.8->14.8. Procalcitonin wnl (0.24)  Initial chest x-ray did not show evidence of infiltrate but CTA chest showed no evidence of PE but infiltrates consistent with bilateral multifocal pneumonia R>L. With low procalcitonin, this is likely a viral pneumonia. However, given patient's history of recurrent aspiration pneumonia and report of recent coughing up of undigested food while sleeping, favor continuing with IV antibiotics for now with transition to Unasyn. Will follow-up viral RPP, blood cultures.   SLP evaluated patient and recommend dysphagia 3 (mech soft) diet. MBS showed mild to moderate pharyngeal dysphagia with suspected esophageal dysphagia. GI consulted for further work-up and possible EGD. - d/c vancomycin and cefepime (Day 1: 1/28) - Start Unasyn 3 g q8h - RPP pending - Blood cultures pending - AM CBC - SLP consulted, appreciate recs: dysphagia 3 diet - GI consulted, appreciate recs  Dysarthria, improving Generalized weakness Minimal dysarthria on exam today. CTH and MRI without evidence of CVA. PT evaluated patient and recommend HH PT with 24 hr supervision.  - Minimize centrally acting medications - PT/OT  Mild AKI, resolved Creatinine improved from 1.06 to 0.64 after fluid resuscitation. - AM BMP - Lisinopril held on admission in setting of hypotension, will continue to monitor pressures and restart accordingly  COPD - Continue home Breo Ellipta, Incruse Ellipta, Trelegy - albuterol PRN  Coronary artery disease - Hold home Plavix - Continue home rosuvastatin 10 mg daily  HFpEF  02/2020 echocardiogram with EF 55-60%. Takes lisinopril, metoprolol, Lasix.  - Hold home lisinopril, metoprolol, and Lasix in setting of hypotension  Hypothyroidism - TSH - Continue home levothyroxine 125 mcg daily  Bipolar disorder On lurasidone and trazodone  at home, managed by PCP. Patient does not currently follow with psychiatry. States her bipolar disorder and anxiety are well-controlled. - Continue home lurasidone (Latuda) 40 mg daily - Restart home trazodone  Anxiety - Continue home Lexapro and alprazolam   Fibromyalgia Chronic pain - Start tylenol 650 q6h PRN - Holding home gabapentin (centrally-acting)   Diet: Dysphagia 3 IVF: None VTE: Enoxaparin Code: DNR PT/OT recs: Home Health, none (patient has RW and cane)     Please contact the on call pager after 5 pm and on weekends at [phone].  Alphonzo Severance, MD PGY-1 Internal Medicine Teaching Service Pager: [phone] 08/12/2020

## 2020-08-13 ENCOUNTER — Inpatient Hospital Stay (HOSPITAL_COMMUNITY): Payer: Medicare (Managed Care) | Admitting: Anesthesiology

## 2020-08-13 ENCOUNTER — Encounter (HOSPITAL_COMMUNITY): Payer: Self-pay | Admitting: Internal Medicine

## 2020-08-13 ENCOUNTER — Encounter (HOSPITAL_COMMUNITY)
Admission: EM | Disposition: A | Discharge status: Home Health | Payer: Self-pay | Source: Home / Self Care | Attending: Internal Medicine

## 2020-08-13 HISTORY — PX: ESOPHAGOGASTRODUODENOSCOPY (EGD) WITH PROPOFOL: SHX5813

## 2020-08-13 HISTORY — PX: SAVORY DILATION: SHX5439

## 2020-08-13 LAB — BASIC METABOLIC PANEL
Anion gap: 10 (ref 5–15)
BUN: 5 mg/dL — ABNORMAL LOW (ref 8–23)
CO2: 24 mmol/L (ref 22–32)
Calcium: 8.9 mg/dL (ref 8.9–10.3)
Chloride: 107 mmol/L (ref 98–111)
Creatinine, Ser: 0.76 mg/dL (ref 0.44–1.00)
GFR, Estimated: >60 mL/min (ref >60)
Glucose, Bld: 100 mg/dL — ABNORMAL HIGH (ref 70–99)
Potassium: 3.4 mmol/L — ABNORMAL LOW (ref 3.5–5.1)
Sodium: 141 mmol/L (ref 135–145)

## 2020-08-13 LAB — RESPIRATORY PANEL BY PCR

## 2020-08-13 LAB — CBC WITH DIFFERENTIAL/PLATELET
Abs Immature Granulocytes: 0.03 10*3/uL (ref 0.00–0.07)
Basophils Absolute: 0 10*3/uL (ref 0.0–0.1)
Basophils Relative: 0 %
Eosinophils Absolute: 0.3 10*3/uL (ref 0.0–0.5)
Eosinophils Relative: 2 %
HCT: 33 % — ABNORMAL LOW (ref 36.0–46.0)
Hemoglobin: 10.2 g/dL — ABNORMAL LOW (ref 12.0–15.0)
Immature Granulocytes: 0 %
Lymphocytes Relative: 15 %
Lymphs Abs: 1.7 10*3/uL (ref 0.7–4.0)
MCH: 29 pg (ref 26.0–34.0)
MCHC: 30.9 g/dL (ref 30.0–36.0)
MCV: 93.8 fL (ref 80.0–100.0)
Monocytes Absolute: 0.9 10*3/uL (ref 0.1–1.0)
Monocytes Relative: 8 %
Neutro Abs: 8.3 10*3/uL — ABNORMAL HIGH (ref 1.7–7.7)
Neutrophils Relative %: 75 %
Platelets: 316 10*3/uL (ref 150–400)
RBC: 3.52 MIL/uL — ABNORMAL LOW (ref 3.87–5.11)
RDW: 15.8 % — ABNORMAL HIGH (ref 11.5–15.5)
WBC: 11.2 10*3/uL — ABNORMAL HIGH (ref 4.0–10.5)
nRBC: 0 % (ref 0.0–0.2)

## 2020-08-13 SURGERY — ESOPHAGOGASTRODUODENOSCOPY (EGD) WITH PROPOFOL
Anesthesia: Monitor Anesthesia Care

## 2020-08-13 MED ORDER — POTASSIUM CHLORIDE CRYS ER 20 MEQ PO TBCR: 40.0000 meq | EXTENDED_RELEASE_TABLET | Freq: Once | ORAL | Status: DC

## 2020-08-13 MED ORDER — LACTATED RINGERS IV SOLN: INTRAVENOUS | Status: DC | PRN

## 2020-08-13 MED ORDER — PROPOFOL 500 MG/50ML IV EMUL
INTRAVENOUS | Status: DC | PRN
  Administered 2020-08-13: 100 ug/kg/min via INTRAVENOUS

## 2020-08-13 MED ORDER — PROPOFOL 10 MG/ML IV BOLUS
INTRAVENOUS | Status: DC | PRN
  Administered 2020-08-13: 20 mg via INTRAVENOUS

## 2020-08-13 MED ORDER — POTASSIUM CHLORIDE CRYS ER 20 MEQ PO TBCR
40.0000 meq | EXTENDED_RELEASE_TABLET | Freq: Once | ORAL | Status: AC
  Administered 2020-08-13: 40 meq via ORAL
  Filled 2020-08-13: qty 2

## 2020-08-13 MED ORDER — SODIUM CHLORIDE 0.9 % IV SOLN: INTRAVENOUS | Status: DC

## 2020-08-13 SURGICAL SUPPLY — 14 items

## 2020-08-13 NOTE — Plan of Care (Signed)

## 2020-08-13 NOTE — Op Note (Signed)
[facility] Patient Name: Erin Lynch Procedure Date : 08/13/2020 MRN: [id number] Attending MD: Jeani Hawking , MD Date of Birth: [birth year] CSN: [id number] Age: 60 Admit Type: Inpatient Procedure:                Upper GI endoscopy Indications:              Dysphagia Providers:                Jeani Hawking, MD, Charlett Lango, RN, Lawson Radar,                            Technician, Dairl Ponder, CRNA Referring MD:              Medicines:                Propofol per Anesthesia Complications:            No immediate complications. Estimated Blood Loss:     Estimated blood loss was minimal. Procedure:                Pre-Anesthesia Assessment:                           - Prior to the procedure, a History and Physical                            was performed, and patient medications and                            allergies were reviewed. The patient's tolerance of                            previous anesthesia was also reviewed. The risks                            and benefits of the procedure and the sedation                            options and risks were discussed with the patient.                            All questions were answered, and informed consent                            was obtained. Prior Anticoagulants: The patient has                            taken no previous anticoagulant or antiplatelet                            agents. ASA Grade Assessment: III - A patient with                            severe systemic disease. After reviewing the risks  and benefits, the patient was deemed in                            satisfactory condition to undergo the procedure.                           - Sedation was administered by an anesthesia                            professional. Deep sedation was attained.                           After obtaining informed consent, the endoscope was                            passed under direct vision.  Throughout the                            procedure, the patient's blood pressure, pulse, and                            oxygen saturations were monitored continuously. The                            [id number]-H190 ([id number] Olympus gastroscope was                            introduced through the mouth, and advanced to the                            second part of duodenum. The upper GI endoscopy was                            accomplished without difficulty. The patient                            tolerated the procedure well. Scope In: Scope Out: Findings:      One benign-appearing, intrinsic mild stenosis was found at the       cricopharyngeus. This stenosis measured 1 cm (in length). The stenosis       was traversed. A guidewire was placed and the scope was withdrawn.       Dilation was performed with a Savary dilator with no resistance at 18       mm. The dilation site was examined following endoscope reinsertion and       showed complete resolution of luminal narrowing. Estimated blood loss       was minimal.      A 3 cm hiatal hernia was present.      The stomach was normal.      The examined duodenum was normal.      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 Impression:               - Benign-appearing esophageal stenosis. Dilated.                           -  3 cm hiatal hernia.                           - Normal stomach.                           - Normal examined duodenum.                           - No specimens collected. Recommendation:           - Return patient to hospital ward for ongoing care.                           - Resume regular diet.                           - Continue present medications.                           - [facility] to follow up in the AM. Procedure Code(s):        --- Professional ---                           [phone], Esophagogastroduodenoscopy, flexible,                            transoral; with insertion of guide wire followed by                             passage of dilator(s) through esophagus over guide                            wire Diagnosis Code(s):        --- Professional ---                           K22.2, Esophageal obstruction                           K44.9, Diaphragmatic hernia without obstruction or                            gangrene                           R13.10, Dysphagia, unspecified CPT copyright 2019 [employer]. All rights reserved. The codes documented in this report are preliminary and upon coder review may  be revised to meet current compliance requirements. Jeani Hawking, MD Jeani Hawking, MD 08/13/2020 9:02:42 AM This report has been signed electronically. Number of Addenda: 0

## 2020-08-13 NOTE — Transfer of Care (Signed)
Immediate Anesthesia Transfer of Care Note  Patient: Erin Lynch  Procedure(s) Performed: ESOPHAGOGASTRODUODENOSCOPY (EGD) WITH PROPOFOL (N/A ) SAVORY DILATION (N/A )  Patient Location: Endoscopy Unit  Anesthesia Type:MAC  Level of Consciousness: drowsy  Airway & Oxygen Therapy: Patient Spontanous Breathing  Post-op Assessment: Report given to RN and Post -op Vital signs reviewed and stable  Post vital signs: Reviewed and stable  Last Vitals:  Vitals Value Taken Time  BP    Temp    Pulse 72 08/13/20 0826  Resp 21 08/13/20 0826  SpO2 95 % 08/13/20 0826  Vitals shown include unvalidated device data.  Last Pain:  Vitals:   08/13/20 0740  TempSrc: Oral  PainSc: 8       Patients Stated Pain Goal: 1 (56/43/32 9518)  Complications: No complications documented.

## 2020-08-13 NOTE — Anesthesia Postprocedure Evaluation (Signed)
Anesthesia Post Note  Patient: Erin Lynch  Procedure(s) Performed: ESOPHAGOGASTRODUODENOSCOPY (EGD) WITH PROPOFOL (N/A ) SAVORY DILATION (N/A )     Patient location during evaluation: Endoscopy Anesthesia Type: MAC Level of consciousness: awake and alert, patient cooperative and oriented Pain management: pain level controlled Vital Signs Assessment: post-procedure vital signs reviewed and stable Respiratory status: spontaneous breathing, nonlabored ventilation and respiratory function stable Cardiovascular status: stable and blood pressure returned to baseline Postop Assessment: no apparent nausea or vomiting Anesthetic complications: no   No complications documented.  Last Vitals:  Vitals:   08/13/20 0847 08/13/20 0857  BP: 132/77 135/66  Pulse: 71 72  Resp: (!) 24 (!) 25  Temp:    SpO2: 92% 95%    Last Pain:  Vitals:   08/13/20 0827  TempSrc: Temporal  PainSc: 0-No pain                 Kunaal Walkins,E. Jony Ladnier

## 2020-08-13 NOTE — Progress Notes (Addendum)
HD#2 Subjective:   Admitted yesterday evening. No acute events overnight.  During evaluation at bedside this morning, patient states she doing well. She states that she did not even realize that she underwent the procedure. Endorses mild throat irritation. She has noticed improvement in her speech.   Objective:   Vital signs in last 24 hours: Vitals:   08/12/20 1732 08/12/20 1956 08/12/20 2338 08/13/20 0410  BP:  131/63 (!) 151/77 (!) 161/88  Pulse:  82 85 83  Resp:  18 18 18   Temp:  98.2 F (36.8 C) (!) 97.4 F (36.3 C) 97.8 F (36.6 C)  TempSrc:  Oral Oral Oral  SpO2: 96% 93% 95% 96%  Weight:      Height:       Supplemental O2: None   Physical Exam Constitutional:      Appearance: Normal appearance.     Comments: Dysarthria much improved. NAD, conversant and answers questions appropriately.   HENT:     Head: Normocephalic and atraumatic.  Cardiovascular:     Rate and Rhythm: Normal rate and regular rhythm.     Pulses: Normal pulses.     Heart sounds: Normal heart sounds. No murmur heard. No friction rub. No gallop.   Pulmonary:     Effort: Pulmonary effort is normal.     Breath sounds: Normal breath sounds. No wheezing, rhonchi or rales.  Neurological:     Mental Status: She is alert and oriented to person, place, and time.  Psychiatric:        Mood and Affect: Mood normal.        Behavior: Behavior normal.     Filed Weights   08/11/20 1018 08/11/20 1958  Weight: 78.5 kg 82.2 kg    Intake/Output Summary (Last 24 hours) at 08/13/2020 0626 Last data filed at 08/13/2020 08/15/2020 Gross per 24 hour  Intake 7.99 ml  Output --  Net 7.99 ml   Net IO Since Admission: 2,377.99 mL [08/13/20 0626]  Pertinent Labs: CBC Latest Ref Rng & Units 08/13/2020 08/12/2020 08/11/2020  WBC 4.0 - 10.5 K/uL 11.2(H) 14.8(H) -  Hemoglobin 12.0 - 15.0 g/dL 10.2(L) 10.2(L) 10.5(L)  Hematocrit 36.0 - 46.0 % 33.0(L) 32.2(L) 31.0(L)  Platelets 150 - 400 K/uL 316 295 -    CMP  Latest Ref Rng & Units 08/13/2020 08/12/2020 08/11/2020  Glucose 70 - 99 mg/dL 08/13/2020) 88 -  BUN 8 - 23 mg/dL 5(L) 9 -  Creatinine 952(W - 1.00 mg/dL 4.13 2.44 -  Sodium 0.10 - 145 mmol/L 141 139 145  Potassium 3.5 - 5.1 mmol/L 3.4(L) 3.1(L) 3.5  Chloride 98 - 111 mmol/L 107 108 -  CO2 22 - 32 mmol/L 24 23 -  Calcium 8.9 - 10.3 mg/dL 8.9 272) -  Total Protein 6.5 - 8.1 g/dL - - -  Total Bilirubin 0.3 - 1.2 mg/dL - - -  Alkaline Phos 38 - 126 U/L - - -  AST 15 - 41 U/L - - -  ALT 0 - 44 U/L - - -    Imaging:  08/11/20 DG Chest 2 View  CLINICAL DATA:  Dysphagia EXAM: CHEST - 2 VIEW COMPARISON:  08/11/2020 FINDINGS: Elevation of the right hemidiaphragm. Right base atelectasis or scarring. Heart is normal size. Left lung clear. No effusions or acute bony abnormality. IMPRESSION: Chronic elevation of the right hemidiaphragm with right base atelectasis or scarring. No active disease. Electronically Signed   By: 08/13/2020 M.D.   On: 08/11/2020 18:45  08/11/20 CT ANGIO CHEST PE W OR WO CONTRAST  CLINICAL DATA:  Difficulty breathing and positive D-dimer EXAM: CT ANGIOGRAPHY CHEST WITH CONTRAST TECHNIQUE: Multidetector CT imaging of the chest was performed using the standard protocol during bolus administration of intravenous contrast. Multiplanar CT image reconstructions and MIPs were obtained to evaluate the vascular anatomy. CONTRAST:  63 mL OMNIPAQUE IOHEXOL 350 MG/ML SOLN COMPARISON:  Chest x-ray from earlier in the same day CT from 04/10/2020. FINDINGS: Cardiovascular: Thoracic aorta and its branches are well visualized. No aneurysmal dilatation is noted. Mild soft atherosclerotic plaque is noted in the descending aorta. No dissection is seen. No cardiac enlargement is noted. The pulmonary artery shows a normal branching pattern bilaterally. No filling defects are identified to suggest pulmonary embolism. Coronary calcifications are noted. Mediastinum/Nodes: Thoracic inlet is within normal limits.  Scattered small hilar and mediastinal lymph nodes are noted likely reactive in nature. The esophagus as visualized is within normal limits. Lungs/Pleura: Lungs are well aerated bilaterally. Patchy bibasilar infiltrate is noted right greater than left without sizable effusion. Some patchy nodular changes are noted in the right upper lobe new from the prior CT from 04/10/2020 also consistent with postinflammatory change. Upper Abdomen: Visualized upper abdomen shows no acute abnormality. Musculoskeletal: No chest wall abnormality. No acute or significant osseous findings. Review of the MIP images confirms the above findings. IMPRESSION: Patchy bilateral infiltrates right greater than left consistent with multifocal pneumonia. Previously seen changes in the left upper lobe on the prior CT have resolved in the interval. No evidence of pulmonary emboli. Electronically Signed   By: Alcide Clever M.D.   On: 08/11/2020 20:57   08/12/20 MR BRAIN WO CONTRAST  CLINICAL DATA:  Stroke follow-up.  Slurred speech and hypotension. EXAM: MRI HEAD WITHOUT CONTRAST TECHNIQUE: Multiplanar, multiecho pulse sequences of the brain and surrounding structures were obtained without intravenous contrast. COMPARISON:  Head CT from yesterday FINDINGS: Brain: No acute infarction, hemorrhage, hydrocephalus, extra-axial collection or mass lesion. Age normal brain volume. Vascular: Normal flow voids. Skull and upper cervical spine: Normal marrow signal. Sinuses/Orbits: Nasal septal perforation.  No sinusitis. Other: Moderate motion artifact. IMPRESSION: Motion degraded study without infarct or other acute finding. Electronically Signed   By: Marnee Spring M.D.   On: 08/12/2020 06:22   08/12/20 DG Swallowing Func-Speech Pathology  Objective Swallowing Evaluation: Type of Study: Bedside Swallow Evaluation   08/12/2020 Clinical Impression Patient presents with a mild oral, mild-moderate pharyngeal dysphagia with suspected esophageal dysphagia.  She exhibited mild delay in mastication and oral transit of regular solids, swallow initiation delays to level of vallecular sinus with all consistencies. She did exhibit flash penetration of thin liquids with larger straw or cup sips, but with full clearance of penetrate. Trace vallecular and pyriform sinus residuals observed with straw sips thin liquids and when taking barium tablet with thin liquids. Esophageal sweep was performed (no radiologist present to confirm) and appearance of dysmotility and stasis of large amount of barium was observed at upper thoracic level of esophagus. No backflow of barium observed at cervical esophageal level. Of note, similar presentation of potential esophageal dysmotility was observed during 11/5 MBS. SLP Visit Diagnosis Dysphagia, oropharyngeal phase (R13.12);Dysphagia, pharyngoesophageal phase (R13.14) Attention and concentration deficit following -- Frontal lobe and executive function deficit following -- Impact on safety and function Mild aspiration risk       Assessment/Plan:   Active Problems:   Weakness   Patient Summary:  Ms. Erin Lynch is a 60 year old woman with history of recurrent  pneumonia, COPD, tobacco use, CAD, HTN, HLD, HFpEF, hypothyroidism, fibromyalgia, bipolar disorder, anxiety who presents with dysarthria, weakness, and cough and admitted for further work-up and evaluation.  This is hospital day 2.  Sepsis secondary to multifocal pneumonia  History of recurrent aspiration pneumonia Pharyngeal dysphagia, potential esophageal dysmotility Patient states that she is doing well this morning. Her speech appears to be her baseline. Promotes throat irritation after the procedure. 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 Her luekocytosis continues to improve and she is not on supplemental O2 this AM.  - Unasyn 3 g q8h - RPP pending - Blood cultures NGTD - Trend CBC - GI  consulted, appreciate recs  - Endoscopy today at 0800  Dysarthria, improving Generalized weakness Patient appears back to baseline. CTH and MRI without evidence of CVA.  - Minimize centrally acting medications - PT/OT> HH OT and PT  Mild AKI, resolved Creatinine 0.76 - AM BMP - Lisinopril held on admission in setting of hypotension, will continue to monitor pressures and restart accordingly  COPD - Continue home Breo Ellipta, Incruse Ellipta, Trelegy - albuterol PRN  Coronary artery disease - Hold home Plavix - Continue home rosuvastatin 10 mg daily  HFpEF  02/2020 echocardiogram with EF 55-60%. Takes lisinopril, metoprolol, Lasix.  - Hold home lisinopril, metoprolol, and Lasix in setting of hypotension  Hypothyroidism - TSH wnl (1.046) - Continue home levothyroxine 125 mcg daily  Bipolar disorder On lurasidone and trazodone at home, managed by PCP. Patient does not currently follow with psychiatry. States her bipolar disorder and anxiety are well-controlled. - Continue home lurasidone (Latuda) 40 mg daily - Restart home trazodone  Anxiety - Continue home Lexapro and alprazolam   Fibromyalgia Chronic pain - Start tylenol 650 q6h PRN - Holding home gabapentin (centrally-acting)   Diet: Dysphagia 3 IVF: None VTE: Enoxaparin Code: DNR PT/OT recs: Home Health, none (patient has RW and cane)  Please contact the on call pager after 5 pm and on weekends at [phone].  Dolan Amen, MD PGY-2 Internal Medicine Teaching Service Pager: [phone] 08/13/2020

## 2020-08-13 NOTE — Progress Notes (Signed)
RT in to assess pt, no wheezing noted, bbs mostly clear, 95% RA. Pt does complain of chest discomfort, but feels that it is just her anxiety. RN in to give medicine for anxiety. RT will check back a little later.

## 2020-08-13 NOTE — Anesthesia Preprocedure Evaluation (Addendum)
Anesthesia Evaluation  Patient identified by MRN, date of birth, ID band Patient awake    Reviewed: Allergy & Precautions, NPO status , Patient's Chart, lab work & pertinent test results, reviewed documented beta blocker date and time   History of Anesthesia Complications Negative for: history of anesthetic complications  Airway Mallampati: II  TM Distance: >3 FB Neck ROM: Full    Dental  (+) Dental Advisory Given   Pulmonary sleep apnea (does not use CPAP) , COPD,  COPD inhaler, Current Smoker and Patient abstained from smoking.,  08/11/2020 SARS coronavirus NEG   breath sounds clear to auscultation       Cardiovascular hypertension, Pt. on medications and Pt. on home beta blockers (-) angina+ CAD, + Past MI and + Cardiac Stents (LAD)   Rhythm:Regular Rate:Normal  02/2020 ECHO: EF 55-60%, normal LVF, normal RVF, mild-mod TR, mild-mod AI   Neuro/Psych Anxiety Depression Bipolar Disorder Schizophrenia    GI/Hepatic Neg liver ROS, GERD  Medicated and Controlled,  Endo/Other  Hypothyroidism   Renal/GU Renal InsufficiencyRenal disease     Musculoskeletal  (+) Arthritis , Fibromyalgia -  Abdominal (+) + obese,   Peds  Hematology  (+) Blood dyscrasia (HB 10.5), anemia ,   Anesthesia Other Findings   Reproductive/Obstetrics                            Anesthesia Physical Anesthesia Plan  ASA: III  Anesthesia Plan: MAC   Post-op Pain Management:    Induction:   PONV Risk Score and Plan: 1  Airway Management Planned: Natural Airway and Nasal Cannula  Additional Equipment: None  Intra-op Plan:   Post-operative Plan:   Informed Consent: I have reviewed the patients History and Physical, chart, labs and discussed the procedure including the risks, benefits and alternatives for the proposed anesthesia with the patient or authorized representative who has indicated his/her understanding and  acceptance.   Patient has DNR.  Discussed DNR with patient and Suspend DNR.   Dental advisory given  Plan Discussed with: CRNA and Surgeon  Anesthesia Plan Comments:        Anesthesia Quick Evaluation

## 2020-08-14 DIAGNOSIS — Z9889 Other specified postprocedural states: Secondary | ICD-10-CM

## 2020-08-14 DIAGNOSIS — I5030 Unspecified diastolic (congestive) heart failure: Secondary | ICD-10-CM

## 2020-08-14 DIAGNOSIS — R1319 Other dysphagia: Secondary | ICD-10-CM

## 2020-08-14 DIAGNOSIS — K449 Diaphragmatic hernia without obstruction or gangrene: Secondary | ICD-10-CM

## 2020-08-14 DIAGNOSIS — K222 Esophageal obstruction: Secondary | ICD-10-CM

## 2020-08-14 DIAGNOSIS — R531 Weakness: Secondary | ICD-10-CM

## 2020-08-14 DIAGNOSIS — F319 Bipolar disorder, unspecified: Secondary | ICD-10-CM

## 2020-08-14 DIAGNOSIS — R471 Dysarthria and anarthria: Secondary | ICD-10-CM

## 2020-08-14 DIAGNOSIS — F419 Anxiety disorder, unspecified: Secondary | ICD-10-CM

## 2020-08-14 DIAGNOSIS — Z8719 Personal history of other diseases of the digestive system: Secondary | ICD-10-CM

## 2020-08-14 DIAGNOSIS — J690 Pneumonitis due to inhalation of food and vomit: Secondary | ICD-10-CM

## 2020-08-14 DIAGNOSIS — A419 Sepsis, unspecified organism: Principal | ICD-10-CM

## 2020-08-14 LAB — CBC
HCT: 37.3 % (ref 36.0–46.0)
Hemoglobin: 11.4 g/dL — ABNORMAL LOW (ref 12.0–15.0)
MCH: 28.4 pg (ref 26.0–34.0)
MCHC: 30.6 g/dL (ref 30.0–36.0)
MCV: 93 fL (ref 80.0–100.0)
Platelets: 354 10*3/uL (ref 150–400)
RBC: 4.01 MIL/uL (ref 3.87–5.11)
RDW: 15.7 % — ABNORMAL HIGH (ref 11.5–15.5)
WBC: 9 10*3/uL (ref 4.0–10.5)
nRBC: 0 % (ref 0.0–0.2)

## 2020-08-14 LAB — BASIC METABOLIC PANEL
Anion gap: 11 (ref 5–15)
BUN: 8 mg/dL (ref 8–23)
CO2: 23 mmol/L (ref 22–32)
Calcium: 8.7 mg/dL — ABNORMAL LOW (ref 8.9–10.3)
Chloride: 107 mmol/L (ref 98–111)
Creatinine, Ser: 0.73 mg/dL (ref 0.44–1.00)
GFR, Estimated: >60 mL/min (ref >60)
Glucose, Bld: 100 mg/dL — ABNORMAL HIGH (ref 70–99)
Potassium: 4 mmol/L (ref 3.5–5.1)
Sodium: 141 mmol/L (ref 135–145)

## 2020-08-14 MED ORDER — CLOPIDOGREL BISULFATE 75 MG PO TABS
75.0000 mg | ORAL_TABLET | Freq: Every day | ORAL | Status: DC
  Administered 2020-08-14: 75 mg via ORAL
  Filled 2020-08-14: qty 1

## 2020-08-14 MED ORDER — AMOXICILLIN-POT CLAVULANATE 875-125 MG PO TABS: 1.0000 | ORAL_TABLET | Freq: Two times a day (BID) | ORAL | 0 refills | Status: AC

## 2020-08-14 MED ORDER — PANTOPRAZOLE SODIUM 40 MG PO TBEC: 40.0000 mg | DELAYED_RELEASE_TABLET | Freq: Every day | ORAL | 0 refills | Status: AC

## 2020-08-14 MED ORDER — FUROSEMIDE 40 MG PO TABS: 40.0000 mg | ORAL_TABLET | Freq: Every day | ORAL | 0 refills | Status: AC | PRN

## 2020-08-14 MED ORDER — PANTOPRAZOLE SODIUM 40 MG PO TBEC
40.0000 mg | DELAYED_RELEASE_TABLET | Freq: Every day | ORAL | Status: DC
  Administered 2020-08-14: 40 mg via ORAL
  Filled 2020-08-14: qty 1

## 2020-08-14 MED ORDER — METOPROLOL SUCCINATE ER 25 MG PO TB24
25.0000 mg | ORAL_TABLET | Freq: Every day | ORAL | Status: DC
  Administered 2020-08-14: 25 mg via ORAL
  Filled 2020-08-14: qty 1

## 2020-08-14 MED ORDER — LISINOPRIL 5 MG PO TABS
5.0000 mg | ORAL_TABLET | Freq: Every day | ORAL | Status: DC
  Administered 2020-08-14: 5 mg via ORAL
  Filled 2020-08-14: qty 1

## 2020-08-14 NOTE — Progress Notes (Signed)
Patient given discharge instructions and stated understanding. 

## 2020-08-14 NOTE — Progress Notes (Signed)
Physical Therapy Treatment Patient Details Name: Erin Lynch MRN: [id number] DOB: 01/26/1961 Today's Date: 08/14/2020    History of Present Illness 60 year old woman with history of recurrent pneumonia, COPD, tobacco use, CAD, HTN, HLD, HFpEF, hypothyroidism, fibromyalgia, bipolar disorder, anxiety who presents with dysarthria, weakness, and cough. Daughter reports the pt was found down, seemingly having a fall when trying to get out of bed. Next day pt found down in kitchen, unable to get up, with altered speech. MRI brain no acute findings    PT Comments    Pt progressing towards all goals. Suspect pt is near baseline both cognitively and functionally. Pt with decreased insight to deficits and safety with increased falls risk and would benefit from use of RW however suspect this is near patients baseline. Acute PT to cont to follow.    Follow Up Recommendations  Home health PT;Supervision/Assistance - 24 hour     Equipment Recommendations  None recommended by PT (has RW which is recommended for her to use in community)    Recommendations for Other Services       Precautions / Restrictions Precautions Precautions: Fall;Other (comment) Precaution Comments: droplet precautions Restrictions Weight Bearing Restrictions: No    Mobility  Bed Mobility Overal bed mobility: Needs Assistance Bed Mobility: Supine to Sit     Supine to sit: Supervision     General bed mobility comments: supervision for safety, no use of bed rail, HOB minimally elevated  Transfers Overall transfer level: Needs assistance Equipment used: None Transfers: Sit to/from Stand Sit to Stand: Min guard         General transfer comment: min guard for safety due to mild impulsivity  Ambulation/Gait Ambulation/Gait assistance: Min guard Gait Distance (Feet): 200 Feet (150x1) Assistive device: Rolling walker (2 wheeled);None Gait Pattern/deviations: Step-through pattern Gait velocity: reduced Gait  velocity interpretation: <1.8 ft/sec, indicate of risk for recurrent falls General Gait Details: pt ambulated without AD and was unsteady with significant lateral sway but no overt episode of LOB, short step length and decreased cadance, pt then given RW to trial pt with significant increase in gait speed, fluidity of gait pattern, and stability. Pt however reports "I won't use a walker, I feel better without it." suggsted pt use rollator for community mobility so she has a seat to sit on when fatigued, pt with 3/4 DOE, SpO2 >94% on RA.   Stairs Stairs: Yes Stairs assistance: Min assist Stair Management: One rail Left (HHA on R) Number of Stairs: 5 (to mimic home) General stair comments: ascend reciprocally, descend step to   Wheelchair Mobility    Modified Rankin (Stroke Patients Only) Modified Rankin (Stroke Patients Only) Pre-Morbid Rankin Score: Slight disability Modified Rankin: Moderately severe disability     Balance Overall balance assessment: Needs assistance Sitting-balance support: No upper extremity supported;Feet supported Sitting balance-Leahy Scale: Good     Standing balance support: No upper extremity supported Standing balance-Leahy Scale: Fair                   Standardized Balance Assessment Standardized Balance Assessment : Dynamic Gait Index   Dynamic Gait Index Level Surface: Mild Impairment Change in Gait Speed: Mild Impairment Gait with Horizontal Head Turns: Mild Impairment Gait with Vertical Head Turns: Mild Impairment Gait and Pivot Turn: Mild Impairment Step Over Obstacle: Moderate Impairment Step Around Obstacles: Mild Impairment Steps: Mild Impairment Total Score: 15      Cognition Arousal/Alertness: Awake/alert Behavior During Therapy: WFL for tasks assessed/performed Overall Cognitive Status: Impaired/Different from baseline  Area of Impairment: Safety/judgement;Problem solving;Attention                   Current  Attention Level: Sustained Memory: Decreased short-term memory Following Commands: Follows one step commands with increased time;Follows multi-step commands inconsistently Safety/Judgement: Decreased awareness of safety;Decreased awareness of deficits Awareness: Emergent Problem Solving: Slow processing;Difficulty sequencing;Requires verbal cues;Requires tactile cues General Comments: pt requiring increased time to find room, hard time with R/L      Exercises      General Comments General comments (skin integrity, edema, etc.): VSS on RA      Pertinent Vitals/Pain Pain Assessment: No/denies pain    Home Living                      Prior Function            PT Goals (current goals can now be found in the care plan section) Acute Rehab PT Goals PT Goal Formulation: With patient Time For Goal Achievement: 08/26/20 Potential to Achieve Goals: Good Progress towards PT goals: Progressing toward goals    Frequency    Min 3X/week      PT Plan Current plan remains appropriate    Co-evaluation              AM-PAC PT "6 Clicks" Mobility   Outcome Measure  Help needed turning from your back to your side while in a flat bed without using bedrails?: A Little Help needed moving from lying on your back to sitting on the side of a flat bed without using bedrails?: A Little Help needed moving to and from a bed to a chair (including a wheelchair)?: A Little Help needed standing up from a chair using your arms (e.g., wheelchair or bedside chair)?: A Little Help needed to walk in hospital room?: A Little Help needed climbing 3-5 steps with a railing? : A Lot 6 Click Score: 17    End of Session Equipment Utilized During Treatment: Gait belt Activity Tolerance: Patient tolerated treatment well Patient left: in chair;with call bell/phone within reach;with chair alarm set Nurse Communication: Mobility status PT Visit Diagnosis: Unsteadiness on feet (R26.81);Muscle  weakness (generalized) (M62.81);History of falling (Z91.81)     Time: 5852-7782 PT Time Calculation (min) (ACUTE ONLY): 24 min  Charges:  $Gait Training: 23-37 mins                     Erin Lynch, PT, DPT Acute Rehabilitation Services Pager #: [phone] Office #: [phone]    Erin Lynch 08/14/2020, 1:36 PM

## 2020-08-14 NOTE — Discharge Summary (Signed)
Name: Erin Lynch MRN: [id number] DOB: [birth year] 60 y.o. PCP: Bonnita Nasuti, MD  Date of Admission: 08/11/2020 10:12 AM Date of Discharge: 08/14/2020 Attending Physician: Dr. Joni Reining  Discharge Diagnosis:  Active Problems:   Aspiration pneumonia ([place])   Sepsis ([place])   Weakness   Stricture and stenosis of esophagus   Hiatal hernia   Discharge Medications: Allergies as of 08/14/2020   No Known Allergies     Medication List    STOP taking these medications   famotidine 20 MG tablet Commonly known as: PEPCID   isosorbide mononitrate 30 MG 24 hr tablet Commonly known as: IMDUR   omeprazole 40 MG capsule Commonly known as: PRILOSEC   zolpidem 10 MG tablet Commonly known as: AMBIEN     TAKE these medications   ALPRAZolam 1 MG tablet Commonly known as: XANAX Take 1 mg by mouth See admin instructions. Takes 1 tablet (1 mg totally) by mouth 2 times daily; takes extra 1 tablet daily if panic attack   amoxicillin-clavulanate 875-125 MG tablet Commonly known as: Augmentin Take 1 tablet by mouth 2 (two) times daily for 6 doses.   aspirin 81 MG EC tablet Take 1 tablet (81 mg total) by mouth daily.   clopidogrel 75 MG tablet Commonly known as: PLAVIX TAKE 1 TABLET BY MOUTH EVERY DAY   desvenlafaxine 50 MG 24 hr tablet Commonly known as: PRISTIQ Take 50 mg by mouth daily.   escitalopram 20 MG tablet Commonly known as: LEXAPRO Take 20 mg by mouth daily.   furosemide 40 MG tablet Commonly known as: LASIX Take 1 tablet (40 mg total) by mouth daily as needed for fluid or edema. What changed:   when to take this  reasons to take this   gabapentin 600 MG tablet Commonly known as: NEURONTIN Take 600 mg by mouth at bedtime.   Latuda 40 MG Tabs tablet Generic drug: lurasidone Take 40 mg by mouth daily.   levothyroxine 125 MCG tablet Commonly known as: SYNTHROID Take 125 mcg by mouth daily.   lisinopril 5 MG tablet Commonly known as: ZESTRIL Take 5 mg  by mouth daily.   metoprolol succinate 25 MG 24 hr tablet Commonly known as: TOPROL-XL Take 25 mg by mouth daily.   nicotine 21 mg/24hr patch Commonly known as: NICODERM CQ - dosed in mg/24 hours Place 1 patch (21 mg total) onto the skin daily.   nitroGLYCERIN 0.4 MG SL tablet Commonly known as: Nitrostat Place 1 tablet (0.4 mg total) under the tongue every 5 (five) minutes as needed for chest pain. What changed: reasons to take this   nystatin powder Commonly known as: MYCOSTATIN/NYSTOP Apply 1 application topically 2 (two) times daily.   pantoprazole 40 MG tablet Commonly known as: PROTONIX Take 1 tablet (40 mg total) by mouth daily.   rosuvastatin 10 MG tablet Commonly known as: CRESTOR Take 10 mg by mouth daily.   traZODone 100 MG tablet Commonly known as: DESYREL Take 100 mg by mouth at bedtime as needed for sleep.   Trelegy Ellipta 100-62.5-25 MCG/INH Aepb Generic drug: Fluticasone-Umeclidin-Vilant Inhale 1 puff into the lungs daily.   Ventolin HFA 108 (90 Base) MCG/ACT inhaler Generic drug: albuterol Inhale 2 puffs into the lungs every 6 (six) hours as needed for shortness of breath.       Disposition and follow-up:   Ms.Erin Lynch was discharged from [facility] in Stable condition.  At the hospital follow up visit please address:  1.  Sepsis secondary to aspiration pneumonia  History of recurrent aspiration pneumonia Pharyngeal dysphagia, potential esophageal dysmotility Esophageal stenosis s/p dilation  3 cm hiatal hernia  Mild cervical esophageal web - Ensure patient completes antibiotic course - last day of Augmentin 2/2 - Ensure GI follow-up with Dr. Michiel Cowboy - Ensure adherence to Protonix 40 mg daily    Dysarthria Generalized weakness - Evaluate patient's progress with HH PT  Mild AKI, resolved Creatinine on admission 1.06. sCr at discharge 0.73.   HFpEF  02/2020 echocardiogram with EF 55-60%. Home lisinopril,  metoprolol, and Lasix held initially due to dehydration, soft pressures, AKI. Lisinopril and metoprolol restarted but home Lasix held at discharge.  - Evaluate for need for Lasix  Bipolar disorder Anxiety - Consider psychiatry referral  2.  Labs / imaging needed at time of follow-up: BMP  3.  Pending labs/ test needing follow-up: none  Follow-up Appointments:  Follow-up Information    [facility] Follow up.   Specialty: Home Health Services Why: The home health agency will contact you for the first home visit. Contact information: [place] [address] [phone]        Michiel Cowboy, MD. Schedule an appointment as soon as possible for a visit.   Specialty: Gastroenterology Contact information: [place] [address] [phone]        Bonnita Nasuti, MD. Schedule an appointment as soon as possible for a visit in 1 week(s).   Specialty: Internal Medicine Contact information: [address] [place] [address] [phone]               Hospital Course by problem list:  Ms. Erin Lynch is a 60 year old woman with history of recurrent pneumonia, COPD, tobacco use, CAD, HTN, HLD, HFpEF, hypothyroidism, fibromyalgia, bipolar disorder, anxiety who presented with dysarthria, weakness, and cough and admittedfor further work-up and evaluation.  Sepsis secondary to multifocal pneumonia  History of recurrent aspiration pneumonia Pharyngeal dysphagia, potential esophageal dysmotility Esophageal stenosis s/p dilation  3 cm hiatal hernia  Mild cervical esophageal web Presented with productive cough, generalized weakness, and dysarthria after waking up in the middle of the night after coughing up undigested food 2 nights ago. Also poor PO intake. Initially hypotensive with MAPs 50s-60s, improved wwith IVF. Tachypneic with RR 15-23, new oxygen requirement of 2-4 L to maintain saturations >93%. On exam,  rhonchi over the R lung fields. Leukocytosis to 16.8. Lactic acid 1.7. COVID negative (vaccinated and boosted). Portable 1-view CXR without consolidation, however there is elevated R hemidiaphragm with mild right basilar atelectasis, mild interstitial pulmonary edema, and small R pleural effusion. With leukocytosis and tachypnea, patient met SIRS criteria and given the patient's history of recurrent pneumonia and recent nighttime episode of coughing undigested food, presentation concerning for sepsis secondary to aspiration pneumonia. Urinalysis unremarkable. Blood culture no grwoth x 3 days. Given patient's age, hypoxia, and elevated D-dimer, could not rule out PE, so CT angio chest PE protocol obtained and was negative. SLP was consulted and performed MBS concerning for pharyngeal dysphagia and potential esophageal dysmotility. GI was consulted and performed EGD on 1/30 which demonstrated mild, benign-appearing esophageal stenosis which was dilated, 3 cm hiatal hernia, and mild cervical esophageal web. Patient was started on 40 mg Protonix daily and will follow-up with GI as an outpatient. During SLP evaluation on the day of discharge, the patient reported improved passage of POs and was observed with regular solids and thin liquids. It is  hoped she will have less esophageal retention  and a decreased likelihood of post-prandial aspiration. SLP recommend continuing esophageal precautions - elevate HOB at home at least six inches; avoid eating/drinking two hours before bedtime. Pt verbalized understanding. Recommended advancing diet to regular solids with no SLP f/u needed. Transitioned to Augmentin at discharge to complete a total 5-day course of antibiotics. RPP negative. Blood cultures no growth to date.             Dysarthria Generalized weakness Patient presented with 2-day history of generalized weakness, and 1-day history of dysarthria. Dysarthria resolved by discharge. CTH and MRI without evidence of  CVA. PT evaluated patient and recommend HH PT with 24 hr supervision.   COPD Continued on home medications  Coronary artery disease Plavix held initially in setting of concern for stroke. Resumed after EGD and continued, along with rosuvastatin at discharge.  HFpEF  02/2020 echocardiogram with EF 55-60%. Home lisinopril, metoprolol, and Lasix held initially due to dehydration, soft pressures, AKI. Lisinopril and metoprolol restarted but home Lasix held at discharge.   Hypothyroidism - TSH wnl (1.046) - Home levothyroxine 125 mcg daily continued  Bipolar disorder On lurasidone and trazodoneat home, managed by PCP. Patient does not currently follow with psychiatry. States her bipolar disorder and anxiety are well-controlled. Consider psychiatry referral.  Anxiety Home meds continued.   ~~~~~~~~~~~~~~~~~~~~~~~~~~~~~~~~~~~~~~~~~~~~~~~~~~~~~~~~~~~~~~~~~~~~~~~~~~  Day of Discharge Subjective:  No acute events overnight. During evaluation at bedside this morning, patient reports she is feeling better. She endorses "spitting up" gooey-brown stuff which she has not done this previously but notes significant improvement in her breathing. Discsused EGD findings of esophageal stricture that was dilated. She was able to eat oatmeal this morning but does not notice much of a difference in her swallowing. Discussed recommendation for outpatient follow up. Recommended to continue outpatient medications on discharge and follow up with her PCP within 1-2 weeks.   Discharge Exam:   BP (!) 153/88 (BP Location: Right Arm)   Pulse 73   Temp (!) 97.5 F (36.4 C) (Oral)   Resp 20   Ht 5' 3.6" (1.615 m)   Wt 82.2 kg   SpO2 91%   BMI 31.50 kg/m  Constitutional:chronically ill-appearingobesewomanlying in bed, in no acute distress, appears older than stated age HENT: normocephalic atraumatic, mucous membranesmoist Cardiovascular: regular rate and rhythm, no m/r/g; no lower extremity  edema Pulmonary/Chest: normal work of breathing onroom air; no wheezes or rales Abdominal: non-distended Neurological: alert & orientedto person, place, and time;significantly improved dysarthria; no facial droop; moves all extremities equally; sensation grossly intact Skin:warm and dry,improvedskin turgor  Pertinent Labs, Studies, and Procedures:   08/11/20 DG Chest 2 View  CLINICAL DATA:  Dysphagia EXAM: CHEST - 2 VIEW COMPARISON:  08/11/2020 FINDINGS: Elevation of the right hemidiaphragm. Right base atelectasis or scarring. Heart is normal size. Left lung clear. No effusions or acute bony abnormality. IMPRESSION: Chronic elevation of the right hemidiaphragm with right base atelectasis or scarring. No active disease. Electronically Signed   By: Rolm Baptise M.D.   On: 08/11/2020 18:45   08/11/20 CT Head Wo Contrast  CLINICAL DATA:  Slurred speech, hypotensive EXAM: CT HEAD WITHOUT CONTRAST TECHNIQUE: Contiguous axial images were obtained from the base of the skull through the vertex without intravenous contrast. COMPARISON:  02/24/2020 FINDINGS: Mild motion degradation. Brain: No evidence of acute infarction, hemorrhage, hydrocephalus, extra-axial collection or mass lesion/mass effect. Vascular: No hyperdense vessel or unexpected calcification. Skull: Normal. Negative for fracture or focal lesion. Sinuses/Orbits: The visualized  paranasal sinuses are essentially clear. The mastoid air cells are unopacified. Other: None. IMPRESSION: Normal head CT. Electronically Signed   By: Julian Hy M.D.   On: 08/11/2020 12:34   08/11/20 CT ANGIO CHEST PE W OR WO CONTRAST  CLINICAL DATA:  Difficulty breathing and positive D-dimer EXAM: CT ANGIOGRAPHY CHEST WITH CONTRAST TECHNIQUE: Multidetector CT imaging of the chest was performed using the standard protocol during bolus administration of intravenous contrast. Multiplanar CT image reconstructions and MIPs were obtained to evaluate the vascular  anatomy. CONTRAST:  63 mL OMNIPAQUE IOHEXOL 350 MG/ML SOLN COMPARISON:  Chest x-ray from earlier in the same day CT from 04/10/2020. FINDINGS: Cardiovascular: Thoracic aorta and its branches are well visualized. No aneurysmal dilatation is noted. Mild soft atherosclerotic plaque is noted in the descending aorta. No dissection is seen. No cardiac enlargement is noted. The pulmonary artery shows a normal branching pattern bilaterally. No filling defects are identified to suggest pulmonary embolism. Coronary calcifications are noted. Mediastinum/Nodes: Thoracic inlet is within normal limits. Scattered small hilar and mediastinal lymph nodes are noted likely reactive in nature. The esophagus as visualized is within normal limits. Lungs/Pleura: Lungs are well aerated bilaterally. Patchy bibasilar infiltrate is noted right greater than left without sizable effusion. Some patchy nodular changes are noted in the right upper lobe new from the prior CT from 04/10/2020 also consistent with postinflammatory change. Upper Abdomen: Visualized upper abdomen shows no acute abnormality. Musculoskeletal: No chest wall abnormality. No acute or significant osseous findings. Review of the MIP images confirms the above findings. IMPRESSION: Patchy bilateral infiltrates right greater than left consistent with multifocal pneumonia. Previously seen changes in the left upper lobe on the prior CT have resolved in the interval. No evidence of pulmonary emboli. Electronically Signed   By: Inez Catalina M.D.   On: 08/11/2020 20:57   08/12/20 MR BRAIN WO CONTRAST  CLINICAL DATA:  Stroke follow-up.  Slurred speech and hypotension. EXAM: MRI HEAD WITHOUT CONTRAST TECHNIQUE: Multiplanar, multiecho pulse sequences of the brain and surrounding structures were obtained without intravenous contrast. COMPARISON:  Head CT from yesterday FINDINGS: Brain: No acute infarction, hemorrhage, hydrocephalus, extra-axial collection or mass lesion. Age normal  brain volume. Vascular: Normal flow voids. Skull and upper cervical spine: Normal marrow signal. Sinuses/Orbits: Nasal septal perforation.  No sinusitis. Other: Moderate motion artifact. IMPRESSION: Motion degraded study without infarct or other acute finding. Electronically Signed   By: Monte Fantasia M.D.   On: 08/12/2020 06:22   08/11/20 DG Chest Port 1 View  CLINICAL DATA:  Cough.  Possible sepsis. EXAM: PORTABLE CHEST 1 VIEW COMPARISON:  Chest x-ray dated April 26, 2020. FINDINGS: The heart size and mediastinal contours are within normal limits. Normal pulmonary vascularity. Diffusely increased interstitial markings compared to the prior study. Small right pleural effusion. No consolidation or pneumothorax. Elevated right hemidiaphragm with mild right basilar atelectasis. No acute osseous abnormality. IMPRESSION: 1. Mild interstitial pulmonary edema and small right pleural effusion. Electronically Signed   By: Titus Dubin M.D.   On: 08/11/2020 11:04   08/12/20 DG Abd Portable 1V  Result Date: 08/12/2020 CLINICAL DATA:  Clearance for MRI EXAM: PORTABLE ABDOMEN - 1 VIEW COMPARISON:  None. FINDINGS: Partial exclusion of the abdomen on the left, which was encompassed on the scanogram from preceding chest CT. No metallic foreign body. Normal bowel gas. No concerning mass effect or gas collection. IMPRESSION: Negative for metallic foreign body. Electronically Signed   By: Monte Fantasia M.D.   On: 08/12/2020 05:48  08/12/20 DG Swallowing Func-Speech Pathology   Objective Swallowing Evaluation: Type of Study: Bedside Swallow Evaluation   08/12/2020 Clinical Impression Patient presents with a mild oral, mild-moderate pharyngeal dysphagia with suspected esophageal dysphagia. She exhibited mild delay in mastication and oral transit of regular solids, swallow initiation delays to level of vallecular sinus with all consistencies. She did exhibit flash penetration of thin liquids with larger straw or cup  sips, but with full clearance of penetrate. Trace vallecular and pyriform sinus residuals observed with straw sips thin liquids and when taking barium tablet with thin liquids. Esophageal sweep was performed (no radiologist present to confirm) and appearance of dysmotility and stasis of large amount of barium was observed at upper thoracic level of esophagus. No backflow of barium observed at cervical esophageal level. Of note, similar presentation of potential esophageal dysmotility was observed during 11/5 MBS. SLP Visit Diagnosis Dysphagia, oropharyngeal phase (R13.12);Dysphagia, pharyngoesophageal phase (R13.14) Attention and concentration deficit following -- Frontal lobe and executive function deficit following -- Impact on safety and function Mild aspiration risk           08/13/20 EGD by Dr. Carol Ada  Findings One benign-appearing, intrinsic mild stenosis was found at the cricopharyngeus. This stenosis measured 1 cm (in length). The stenosis was traversed. A guidewire was placed and the scope was withdrawn. Dilation was performed with a Savary dilator with no resistance at 18 mm. The dilation site was examined following endoscope reinsertion and showed complete resolution of luminal narrowing. Estimated blood loss was minimal. - A 3 cm hiatal hernia was present. - The stomach was normal. - The examined duodenum was normal. - Dilation of the UES unmasked a mild cervical esophageal web.  Impression  - Benign-appearing esophageal stenosis. Dilated. - 3 cm hiatal hernia. - Normal stomach. - Normal examined duodenum. - No specimens collected.  Discharge Instructions: Discharge Instructions    Call MD for:  difficulty breathing, headache or visual disturbances   Complete by: As directed    Call MD for:  extreme fatigue   Complete by: As directed    Call MD for:  persistant dizziness or light-headedness   Complete by: As directed    Call MD for:  persistant nausea and vomiting    Complete by: As directed    Call MD for:  redness, tenderness, or signs of infection (pain, swelling, redness, odor or green/yellow discharge around incision site)   Complete by: As directed    Call MD for:  severe uncontrolled pain   Complete by: As directed    Call MD for:  temperature >100.4   Complete by: As directed       Ms. Erin Lynch,  It was a pleasure taking care of you in the hospital.  - You were admitted after being found down and for difficulty with your speech, weakness, and cough.  - Based on your clinical picture and chest xray, we treated you for pneumonia with intravenous antibiotics (Unasyn). We are switching you to an oral antibiotic (Augmentin) after discharge. Please take 1 dose of Augmentin today (1/31) followed by 1 dose twice daily 2/1 and 2/2. Your cough should continue to improve with time. - You were evaluated by gastroenterology (GI) specialists who took you for a procedure (EGD = Esophagogastroduodenoscopy) to evaluate your upper GI tract. During the procedure, they saw some narrowing of your esophagus which they dilated (made bigger) by stretching the tissue. They also saw a hiatal hernia and mild esophageal web. They would like you to take  an antacid, Protonix 40 mg daily, and follow-up with Dr. Michiel Cowboy. - Because of your difficulty speaking originally, you were evaluated with head imaging. This imaging was negative for stroke.  - You were very dehydrated when you came to the hospital and were treated with IV fluids. You may resume taking your furosemide (Lasix), however please take it only if you feel you have extra fluid on you.  - Please stop taking Ambien because this medication, in combination with your other medications, can contribute to confusion/falls.  Please follow-up with your primary care physician in the next couple of weeks. Recommend asking for a referral to psychiatry at this time.  Take care!  Signed: Alexandria Lodge, MD 08/14/2020, 5:52 PM    Pager: [phone]

## 2020-08-14 NOTE — Care Management Important Message (Signed)
Important Message  Patient Details  Name: Erin Lynch MRN: [id number] Date of Birth: [birth year]   Medicare Important Message Given:  Yes     Iva Montelongo 08/14/2020, 4:04 PM

## 2020-08-14 NOTE — Progress Notes (Signed)
  Speech Language Pathology Treatment: Dysphagia  Patient Details Name: Erin Lynch MRN: [id number] DOB: 1961-05-23 Today's Date: 08/14/2020 Time: 4039-7953 SLP Time Calculation (min) (ACUTE ONLY): 13 min  Assessment / Plan / Recommendation Clinical Impression  F/u after MBS and yesterday's EGD, during which her esophagus was dilated. She reports improved passage of POs, and was observed with regular solids and thin liquids.  There were no overt s/s of dysphagia.  It is hoped that she will have less esophageal retention s/p procedure and a decreased likelihood of post-prandial aspiration. Recommend continuing esophageal precautions - elevate HOB at home at least six inches; avoid eating/drinking two hours before bedtime. Pt verbalized understanding. Recommend advancing diet to regular solids - no SLP f/u needed. Our service will sign off.   HPI HPI: 60 year old woman with history of recurrent pneumonia, COPD, tobacco use, CAD, HTN, HLD, HFpEF, hypothyroidism, fibromyalgia, bipolar disorder, anxiety who presents with dysarthria, weakness, and cough. Daughter reports the pt was found down, seemingly having a fall when trying to get out of bed. Next day pt found down in kitchen, unable to get up, with altered speech. EGD 1/30 mild cervical esophageal web, mild esophageal stenosis, dilated.      SLP Plan  All goals met       Recommendations  Diet recommendations: Regular;Thin liquid Liquids provided via: Cup;Straw Medication Administration: Whole meds with liquid Supervision: Patient able to self feed Postural Changes and/or Swallow Maneuvers: Seated upright 90 degrees                Oral Care Recommendations: Oral care BID Follow up Recommendations: None SLP Visit Diagnosis: Dysphagia, pharyngoesophageal phase (R13.14) Plan: All goals met       GO                Juan Quam Laurice 08/14/2020, 12:58 PM  Kimberlie Csaszar L. Tivis Ringer, [place] Office number [phone] Pager [phone]

## 2020-08-14 NOTE — Discharge Instructions (Signed)
Erin Lynch,  It was a pleasure taking care of you in the hospital.  - You were admitted after being found down and for difficulty with your speech, weakness, and cough.  - Based on your clinical picture and chest xray, we treated you for pneumonia with intravenous antibiotics (Unasyn). We are switching you to an oral antibiotic (Augmentin) after discharge. Please take 1 dose of Augmentin today (1/31) followed by 1 dose twice daily 2/1 and 2/2. Your cough should continue to improve with time. - You were evaluated by gastroenterology (GI) specialists who took you for a procedure (EGD = Esophagogastroduodenoscopy) to evaluate your upper GI tract. During the procedure, they saw some narrowing of your esophagus which they dilated (made bigger) by stretching the tissue. They also saw a hiatal hernia and mild esophageal web. They would like you to take an antacid, Protonix 40 mg daily, and follow-up with Dr. Dewaine Conger. - Because of your difficulty speaking originally, you were evaluated with head imaging. This imaging was negative for stroke.  - You were very dehydrated when you came to the hospital and were treated with IV fluids. You may resume taking your furosemide (Lasix), however please take it only if you feel you have extra fluid on you.  - Please stop taking Ambien because this medication, in combination with your other medications, can contribute to confusion/falls.  Please follow-up with your primary care physician in the next couple of weeks. Recommend asking for a referral to psychiatry at this time.  Take care!

## 2020-08-14 NOTE — TOC Transition Note (Signed)
Transition of Care [facility]) - CM/SW Discharge Note   Patient Details  Name: Erin Lynch MRN: [id number] Date of Birth: 11-18-1960  Transition of Care [facility]) CM/SW Contact:  Kermit Balo, RN Phone Number: 08/14/2020, 3:07 PM   Clinical Narrative:    Pt is discharging home with home health services through [place]. Pt asked for [facility] but they are not in network with her insurance. She did not have a preference for a second choice.  No DME needs.  Pt has supervision at home and transportation to home.   Final next level of care: Home w Home Health Services Barriers to Discharge: No Barriers Identified   Patient Goals and CMS Choice   CMS Medicare.gov Compare Post Acute Care list provided to:: Patient Choice offered to / list presented to : Patient  Discharge Placement                       Discharge Plan and Services                          HH Arranged: PT,OT [facility] Agency: [facility] Date [facility] Agency Contacted: 08/14/20   Representative spoke with at [facility] Agency: Kandee Keen  Social Determinants of Health (SDOH) Interventions     Readmission Risk Interventions No flowsheet data found.

## 2020-08-14 NOTE — Plan of Care (Signed)

## 2020-08-14 NOTE — Progress Notes (Signed)
Occupational Therapy Treatment Patient Details Name: Erin Lynch MRN: [id number] DOB: 07-14-1961 Today's Date: 08/14/2020    History of present illness 60 year old woman with history of recurrent pneumonia, COPD, tobacco use, CAD, HTN, HLD, HFpEF, hypothyroidism, fibromyalgia, bipolar disorder, anxiety who presents with dysarthria, weakness, and cough. Daughter reports the pt was found down, seemingly having a fall when trying to get out of bed. Next day pt found down in kitchen, unable to get up, with altered speech. MRI brain no acute findings   OT comments  Pt making steady progress towards OT goals this session. Session focus on functional mobility, UB grooming tasks at sink, and toileting tasks. Pt required up to min guard assist for functional mobility with no AD. Pt AxOx4 and able to accurate state why she is in the hospital reports that she doesn't complete an IADLs at home. Pt reports likely to DC home today with 24/7 assist. Pt would continue to benefit from skilled occupational therapy while admitted and after d/c to address the below listed limitations in order to improve overall functional mobility and facilitate independence with BADL participation. DC plan remains appropriate, will follow acutely per POC.     Follow Up Recommendations  Home health OT;Supervision/Assistance - 24 hour    Equipment Recommendations  None recommended by OT    Recommendations for Other Services      Precautions / Restrictions Precautions Precautions: None Precaution Comments: droplet precautions Restrictions Weight Bearing Restrictions: No       Mobility Bed Mobility Overal bed mobility: Needs Assistance Bed Mobility: Supine to Sit;Sit to Supine     Supine to sit: Supervision Sit to supine: Supervision   General bed mobility comments: supervision for safety, no use of bed rail, HOB minimally elevated  Transfers Overall transfer level: Needs assistance Equipment used: None Transfers:  Sit to/from Stand Sit to Stand: Min guard;Supervision         General transfer comment: min guard from EOB, supervisin from toilet seat with cues to use grab bar    Balance Overall balance assessment: Needs assistance Sitting-balance support: No upper extremity supported;Feet supported Sitting balance-Leahy Scale: Good     Standing balance support: No upper extremity supported Standing balance-Leahy Scale: Fair Standing balance comment: able to complete ADLs at sink with no UE support                 Standardized Balance Assessment Standardized Balance Assessment : Dynamic Gait Index   Dynamic Gait Index Level Surface: Mild Impairment Change in Gait Speed: Mild Impairment Gait with Horizontal Head Turns: Mild Impairment Gait with Vertical Head Turns: Mild Impairment Gait and Pivot Turn: Mild Impairment Step Over Obstacle: Moderate Impairment Step Around Obstacles: Mild Impairment Steps: Mild Impairment Total Score: 15     ADL either performed or assessed with clinical judgement   ADL Overall ADL's : Needs assistance/impaired     Grooming: Wash/dry hands;Oral care;Standing;Supervision/safety Grooming Details (indicate cue type and reason): applying lotion from seated EOB     Lower Body Bathing: Supervison/ safety;Sitting/lateral leans Lower Body Bathing Details (indicate cue type and reason): simulated via applying lotion to back side         Toilet Transfer: Min guard;Ambulation;Regular Toilet;Grab bars Toilet Transfer Details (indicate cue type and reason): minguard for safety with no AD Toileting- Clothing Manipulation and Hygiene: Supervision/safety;Sitting/lateral lean     Tub/Shower Transfer Details (indicate cue type and reason): pt reports walkin shower at home with grab bars and a seat Functional mobility during ADLs: Min  guard General ADL Comments: pt appears to be mostly at baseline level of function. pt completed toileting, grooming tasks and  functional mobility with up to minguard assist for safety     Vision       Perception     Praxis      Cognition Arousal/Alertness: Awake/alert Behavior During Therapy: [facility] for tasks assessed/performed Overall Cognitive Status: Impaired/Different from baseline Area of Impairment: Safety/judgement;Problem solving;Attention                   Current Attention Level: Sustained Memory: Decreased short-term memory Following Commands: Follows one step commands with increased time;Follows multi-step commands inconsistently Safety/Judgement: Decreased awareness of safety;Decreased awareness of deficits Awareness: Emergent Problem Solving: Slow processing;Difficulty sequencing;Requires verbal cues;Requires tactile cues General Comments: pt likely near baseline level of function. noted some decreased safety awareness however likely pts baseline. pt easily distracted during session needing cues to focus on session        Exercises     Shoulder Instructions       General Comments VSS on RA. pt reports she lives with her husband and her daughter checks on them frequently. pt reports she does not do any IADLs and she likes to reach and watch TV    Pertinent Vitals/ Pain       Pain Assessment: No/denies pain   Home Living                                          Prior Functioning/Environment              Frequency  Min 2X/week        Progress Toward Goals  OT Goals(current goals can now be found in the care plan section)  Progress towards OT goals: Progressing toward goals  Acute Rehab OT Goals Patient Stated Goal: to improve strength and reduce pain, go home OT Goal Formulation: With patient Time For Goal Achievement: 08/26/20 Potential to Achieve Goals: Good  Plan Discharge plan remains appropriate;Frequency remains appropriate    Co-evaluation                 AM-PAC OT "6 Clicks" Daily Activity     Outcome Measure   Help from  another person eating meals?: None Help from another person taking care of personal grooming?: None Help from another person toileting, which includes using toliet, bedpan, or urinal?: A Little Help from another person bathing (including washing, rinsing, drying)?: A Little Help from another person to put on and taking off regular upper body clothing?: None Help from another person to put on and taking off regular lower body clothing?: A Little 6 Click Score: 21    End of Session Equipment Utilized During Treatment: Gait belt  OT Visit Diagnosis: Unsteadiness on feet (R26.81);Other symptoms and signs involving cognitive function   Activity Tolerance Patient tolerated treatment well   Patient Left in bed;with call bell/phone within reach;with bed alarm set   Nurse Communication Mobility status        Time: 5885-0277 OT Time Calculation (min): 20 min  Charges: OT General Charges $OT Visit: 1 Visit OT Treatments $Self Care/Home Management : 8-22 mins   Lenor Derrick., COTA/L Acute Rehabilitation Services [phone]    Barron Schmid 08/14/2020, 3:34 PM

## 2020-08-14 NOTE — Progress Notes (Signed)
Daily Rounding Note  08/14/2020, 9:53 AM  LOS: 3 days   SUBJECTIVE:   Chief complaint:    Dysphagia.  S/p EGD and esoph dilation.    Pt says her swallowing seems better but it is hard to tell because she had oatmeal for breakfast which is normally not a problem swallowing.  Attending team has advanced her diet to regular.  She tells me they told her she is going home today.  OBJECTIVE:         Vital signs in last 24 hours:    Temp:  [97.6 F (36.4 C)-98.1 F (36.7 C)] 97.7 F (36.5 C) (01/31 0812) Pulse Rate:  [56-73] 56 (01/31 0812) Resp:  [16-18] 16 (01/31 0812) BP: (141-163)/(71-83) 163/82 (01/31 0812) SpO2:  [93 %-100 %] 98 % (01/31 0829)   Filed Weights   08/11/20 1018 08/11/20 1960  Weight: 78.5 kg 82.2 kg   General: NAD, comfortable, obese Heart: RRR Chest: No labored breathing or cough Abdomen: Nontender, soft, active bowel sounds.  No distention. Extremities: No CCE. Neuro/Psych: Oriented x3.  Appropriate.  Fluid speech.  Intake/Output from previous day: 01/30 0701 - 01/31 0700 In: 650.4 [P.O.:120; I.V.:103.7; IV Piggyback:426.7] Out: -   Intake/Output this shift: No intake/output data recorded.  Lab Results: Recent Labs    08/12/20 0049 08/13/20 0012 08/14/20 0735  WBC 14.8* 11.2* 9.0  HGB 10.2* 10.2* 11.4*  HCT 32.2* 33.0* 37.3  PLT 295 316 354   BMET Recent Labs    08/12/20 0049 08/13/20 0012 08/14/20 0735  NA 139 141 141  K 3.1* 3.4* 4.0  CL 108 107 107  CO2 23 24 23   GLUCOSE 88 100* 100*  BUN 9 5* 8  CREATININE 0.64 0.76 0.73  CALCIUM 8.2* 8.9 8.7*   LFT Recent Labs    08/11/20 1024  PROT 6.0*  ALBUMIN 2.9*  AST 34  ALT 21  ALKPHOS 80  BILITOT 0.6   PT/INR Recent Labs    08/11/20 1024  LABPROT 12.9  INR 1.0   Hepatitis Panel No results for input(s): HEPBSAG, HCVAB, HEPAIGM, HEPBIGM in the last 72 hours.  Studies/Results: DG Swallowing Func-Speech  Pathology  Result Date: 08/12/2020 Objective Swallowing Evaluation: Type of Study: Bedside Swallow Evaluation  Patient Details Name: Valoria Tamburri MRN: [id number] Jude Date of Birth: 03/24/1961 Today's Date: 08/12/2020 Time: SLP Start Time (ACUTE ONLY): 1110 -SLP Stop Time (ACUTE ONLY): 1130 SLP Time Calculation (min) (ACUTE ONLY): 20 min Past Medical History: Past Medical History: Diagnosis Date . Anxiety  . Arthritis  . Bulging disc  . CAD (coronary artery disease)   a. NSTEMI 12/2011: BMS-LAD b. 09/2015: cath showing patent stent w/ mild nonobstructive CAD. 10/2015 Chronic diastolic CHF (congestive heart failure) (HCC)  . Depression  . Fibromyalgia  . GERD (gastroesophageal reflux disease)  . Hyperlipidemia LDL goal < 70  . Hypertension  . Hypothyroid  . Obesity (BMI 30.0-34.9)  Past Surgical History: Past Surgical History: Procedure Laterality Date . CARDIAC CATHETERIZATION  01/01/2012   WITH CORONARY ANGIOGRAM, left heart  . CARDIAC CATHETERIZATION N/A 10/11/2015  Procedure: Left Heart Cath and Coronary Angiography;  Surgeon: 10/13/2015, MD;  Location: [facility] INVASIVE CV LAB;  Service: Cardiovascular;  Laterality: N/A; . CORONARY STENT PLACEMENT   . LEFT HEART CATHETERIZATION WITH CORONARY ANGIOGRAM N/A 01/01/2012  Procedure: LEFT HEART CATHETERIZATION WITH CORONARY ANGIOGRAM;  Surgeon: 01/03/2012, MD;  Location: [facility] CATH LAB;  Service: Cardiovascular;  Laterality: N/A; HPI: 60 year old  woman with history of recurrent pneumonia, COPD, tobacco use, CAD, HTN, HLD, HFpEF, hypothyroidism, fibromyalgia, bipolar disorder, anxiety who presents with dysarthria, weakness, and cough. Daughter reports the pt was found down, seemingly having a fall when trying to get out of bed. Next day pt found down in kitchen, unable to get up, with altered speech.  Subjective: pleasant, cooperative Assessment / Plan / Recommendation CHL IP CLINICAL IMPRESSIONS 08/12/2020 Clinical Impression Patient presents with a mild oral, mild-moderate pharyngeal  dysphagia with suspected esophageal dysphagia. She exhibited mild delay in mastication and oral transit of regular solids, swallow initiation delays to level of vallecular sinus with all consistencies. She did exhibit flash penetration of thin liquids with larger straw or cup sips, but with full clearance of penetrate. Trace vallecular and pyriform sinus residuals observed with straw sips thin liquids and when taking barium tablet with thin liquids. Esophageal sweep was performed (no radiologist present to confirm) and appearance of dysmotility and stasis of large amount of barium was observed at upper thoracic level of esophagus. No backflow of barium observed at cervical esophageal level. Of note, similar presentation of potential esophageal dysmotility was observed during 11/5 MBS. SLP Visit Diagnosis Dysphagia, oropharyngeal phase (R13.12);Dysphagia, pharyngoesophageal phase (R13.14) Attention and concentration deficit following -- Frontal lobe and executive function deficit following -- Impact on safety and function Mild aspiration risk   CHL IP TREATMENT RECOMMENDATION 08/12/2020 Treatment Recommendations Therapy as outlined in treatment plan below   Prognosis 08/12/2020 Prognosis for Safe Diet Advancement Good Barriers to Reach Goals -- Barriers/Prognosis Comment -- CHL IP DIET RECOMMENDATION 08/12/2020 SLP Diet Recommendations Dysphagia 3 (Mech soft) solids;Thin liquid Liquid Administration via Cup;Straw Medication Administration Whole meds with liquid Compensations Minimize environmental distractions;Small sips/bites;Slow rate Postural Changes Remain semi-upright after after feeds/meals (Comment);Seated upright at 90 degrees   CHL IP OTHER RECOMMENDATIONS 08/12/2020 Recommended Consults Consider GI evaluation;Consider esophageal assessment Oral Care Recommendations Oral care BID Other Recommendations --   CHL IP FOLLOW UP RECOMMENDATIONS 08/12/2020 Follow up Recommendations None   CHL IP FREQUENCY AND DURATION  08/12/2020 Speech Therapy Frequency (ACUTE ONLY) min 1 x/week Treatment Duration 1 week      CHL IP ORAL PHASE 08/12/2020 Oral Phase Impaired Oral - Pudding Teaspoon -- Oral - Pudding Cup -- Oral - Honey Teaspoon -- Oral - Honey Cup -- Oral - Nectar Teaspoon -- Oral - Nectar Cup -- Oral - Nectar Straw -- Oral - Thin Teaspoon -- Oral - Thin Cup Premature spillage Oral - Thin Straw Premature spillage Oral - Puree Delayed oral transit;Reduced posterior propulsion Oral - Mech Soft -- Oral - Regular Impaired mastication;Delayed oral transit;Reduced posterior propulsion Oral - Multi-Consistency -- Oral - Pill Delayed oral transit;Reduced posterior propulsion Oral Phase - Comment --  CHL IP PHARYNGEAL PHASE 08/12/2020 Pharyngeal Phase -- Pharyngeal- Pudding Teaspoon -- Pharyngeal -- Pharyngeal- Pudding Cup -- Pharyngeal -- Pharyngeal- Honey Teaspoon -- Pharyngeal -- Pharyngeal- Honey Cup -- Pharyngeal -- Pharyngeal- Nectar Teaspoon -- Pharyngeal -- Pharyngeal- Nectar Cup -- Pharyngeal -- Pharyngeal- Nectar Straw -- Pharyngeal -- Pharyngeal- Thin Teaspoon -- Pharyngeal -- Pharyngeal- Thin Cup -- Pharyngeal -- Pharyngeal- Thin Straw Pharyngeal residue - valleculae;Pharyngeal residue - pyriform Pharyngeal -- Pharyngeal- Puree -- Pharyngeal -- Pharyngeal- Mechanical Soft -- Pharyngeal -- Pharyngeal- Regular -- Pharyngeal -- Pharyngeal- Multi-consistency -- Pharyngeal -- Pharyngeal- Pill -- Pharyngeal -- Pharyngeal Comment --  CHL IP CERVICAL ESOPHAGEAL PHASE 08/12/2020 Cervical Esophageal Phase WFL Pudding Teaspoon -- Pudding Cup -- Honey Teaspoon -- Honey Cup -- Nectar Teaspoon -- Nectar Cup --  Nectar Straw -- Thin Teaspoon -- Thin Cup -- Thin Straw -- Puree -- Mechanical Soft -- Regular -- Multi-consistency -- Pill -- Cervical Esophageal Comment -- Angela Nevin, MA, CCC-SLP Speech Therapy [facility] Acute Rehab             Scheduled Meds: . clopidogrel  75 mg Oral Daily  . famotidine  20 mg Oral Daily  . fluticasone  furoate-vilanterol  1 puff Inhalation Daily  . levothyroxine  125 mcg Oral Q0600  . lisinopril  5 mg Oral Daily  . lurasidone  40 mg Oral Daily  . metoprolol succinate  25 mg Oral Daily  . nicotine  21 mg Transdermal Daily  . rosuvastatin  10 mg Oral Daily  . umeclidinium bromide  1 puff Inhalation Daily  . venlafaxine XR  75 mg Oral Q breakfast   Continuous Infusions: . sodium chloride 10 mL/hr at 08/14/20 0605  . ampicillin-sulbactam (UNASYN) IV 3 g (08/14/20 0607)   PRN Meds:.sodium chloride, acetaminophen, albuterol, ALPRAZolam, traZODone ASSESMENT:   *    Oropharyngeal dysphagia. 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  This was Savary dilated to 18 mm resulting in resolution of luminal narrowing.  3 cm HH.  After dilation there was a mild cervical esophageal web noted. Previous EGD 04/14/2020 by Dr. Dewaine Conger at [facility].  Normal esophagus, stomach and examined duodenum.  Plan was for esophageal manometry and barium esophagram.  These were never completed. Home meds include omeprazole 40 mg/day.  *    Aspiration pneumonia, bilateral w sepsis.  Previous episode of aspiration pneumonia in September 2021.  Unasyn in place.  *    Chronic anemia.  Hb 7.1 in September 2021 with general ranges 8.6 -11.  Oral iron.  *     Chronic Plavix.  Previous coronary stenting 2013.  Hx MI.    *    Dysarthria, no evident CVA on imaging.   PLAN   *  OK to resume Plavix.  *   GI fup w Dr Dewaine Conger or surrogate GI provider at [facility].    *   Rec switch from Pepcid to Protonix 40 mg/day (or equivalent PPI) at discharge.  Making switch now.      Erin Lynch  08/14/2020, 9:53 AM Phone [phone]

## 2020-08-16 LAB — CULTURE, BLOOD (SINGLE)
Culture: NO GROWTH
Culture: NO GROWTH

## 2020-09-05 ENCOUNTER — Other Ambulatory Visit: Payer: Self-pay | Admitting: Student

## 2021-02-17 ENCOUNTER — Emergency Department (HOSPITAL_COMMUNITY)
Admission: EM | Admit: 2021-02-17 | Discharge: 2021-02-18 | Disposition: A | Discharge status: Home / Self Care | Payer: Medicare HMO | Attending: Emergency Medicine | Admitting: Emergency Medicine

## 2021-02-17 DIAGNOSIS — R45851 Suicidal ideations: Secondary | ICD-10-CM

## 2021-02-17 DIAGNOSIS — E039 Hypothyroidism, unspecified: Secondary | ICD-10-CM | POA: Insufficient documentation

## 2021-02-17 DIAGNOSIS — I2510 Atherosclerotic heart disease of native coronary artery without angina pectoris: Secondary | ICD-10-CM | POA: Diagnosis not present

## 2021-02-17 DIAGNOSIS — Z7902 Long term (current) use of antithrombotics/antiplatelets: Secondary | ICD-10-CM | POA: Insufficient documentation

## 2021-02-17 DIAGNOSIS — Z046 Encounter for general psychiatric examination, requested by authority: Secondary | ICD-10-CM | POA: Diagnosis present

## 2021-02-17 DIAGNOSIS — I5032 Chronic diastolic (congestive) heart failure: Secondary | ICD-10-CM | POA: Insufficient documentation

## 2021-02-17 DIAGNOSIS — I110 Hypertensive heart disease with heart failure: Secondary | ICD-10-CM | POA: Insufficient documentation

## 2021-02-17 DIAGNOSIS — Z7982 Long term (current) use of aspirin: Secondary | ICD-10-CM | POA: Insufficient documentation

## 2021-02-17 DIAGNOSIS — F1994 Other psychoactive substance use, unspecified with psychoactive substance-induced mood disorder: Secondary | ICD-10-CM | POA: Diagnosis present

## 2021-02-17 DIAGNOSIS — Z20822 Contact with and (suspected) exposure to covid-19: Secondary | ICD-10-CM | POA: Diagnosis not present

## 2021-02-17 DIAGNOSIS — J449 Chronic obstructive pulmonary disease, unspecified: Secondary | ICD-10-CM | POA: Insufficient documentation

## 2021-02-17 DIAGNOSIS — F17210 Nicotine dependence, cigarettes, uncomplicated: Secondary | ICD-10-CM | POA: Insufficient documentation

## 2021-02-17 DIAGNOSIS — Z79899 Other long term (current) drug therapy: Secondary | ICD-10-CM | POA: Insufficient documentation

## 2021-02-17 DIAGNOSIS — Y906 Blood alcohol level of 120-199 mg/100 ml: Secondary | ICD-10-CM | POA: Diagnosis not present

## 2021-02-17 DIAGNOSIS — F1924 Other psychoactive substance dependence with psychoactive substance-induced mood disorder: Secondary | ICD-10-CM | POA: Insufficient documentation

## 2021-02-17 LAB — CBC WITH DIFFERENTIAL/PLATELET
Abs Immature Granulocytes: 0.05 10*3/uL (ref 0.00–0.07)
Basophils Absolute: 0.1 10*3/uL (ref 0.0–0.1)
Basophils Relative: 1 %
Eosinophils Absolute: 0.2 10*3/uL (ref 0.0–0.5)
Eosinophils Relative: 2 %
HCT: 39.7 % (ref 36.0–46.0)
Hemoglobin: 13.3 g/dL (ref 12.0–15.0)
Immature Granulocytes: 0 %
Lymphocytes Relative: 38 %
Lymphs Abs: 4.2 10*3/uL — ABNORMAL HIGH (ref 0.7–4.0)
MCH: 31 pg (ref 26.0–34.0)
MCHC: 33.5 g/dL (ref 30.0–36.0)
MCV: 92.5 fL (ref 80.0–100.0)
Monocytes Absolute: 0.8 10*3/uL (ref 0.1–1.0)
Monocytes Relative: 7 %
Neutro Abs: 6 10*3/uL (ref 1.7–7.7)
Neutrophils Relative %: 52 %
Platelets: 471 10*3/uL — ABNORMAL HIGH (ref 150–400)
RBC: 4.29 MIL/uL (ref 3.87–5.11)
RDW: 14.6 % (ref 11.5–15.5)
WBC: 11.3 10*3/uL — ABNORMAL HIGH (ref 4.0–10.5)
nRBC: 0 % (ref 0.0–0.2)

## 2021-02-17 LAB — COMPREHENSIVE METABOLIC PANEL
ALT: 14 U/L (ref 0–44)
AST: 18 U/L (ref 15–41)
Albumin: 3.5 g/dL (ref 3.5–5.0)
Alkaline Phosphatase: 107 U/L (ref 38–126)
Anion gap: 12 (ref 5–15)
BUN: 8 mg/dL (ref 8–23)
CO2: 19 mmol/L — ABNORMAL LOW (ref 22–32)
Calcium: 8.9 mg/dL (ref 8.9–10.3)
Chloride: 106 mmol/L (ref 98–111)
Creatinine, Ser: 0.71 mg/dL (ref 0.44–1.00)
GFR, Estimated: >60 mL/min (ref >60)
Glucose, Bld: 96 mg/dL (ref 70–99)
Potassium: 3 mmol/L — ABNORMAL LOW (ref 3.5–5.1)
Sodium: 137 mmol/L (ref 135–145)
Total Bilirubin: 0.4 mg/dL (ref 0.3–1.2)
Total Protein: 6.6 g/dL (ref 6.5–8.1)

## 2021-02-17 LAB — SALICYLATE LEVEL: Salicylate Lvl: <7 mg/dL — ABNORMAL LOW (ref 7.0–30.0)

## 2021-02-17 LAB — ACETAMINOPHEN LEVEL: Acetaminophen (Tylenol), Serum: <10 ug/mL — ABNORMAL LOW (ref 10–30)

## 2021-02-17 LAB — ETHANOL: Alcohol, Ethyl (B): 159 mg/dL — ABNORMAL HIGH (ref <10)

## 2021-02-17 MED ORDER — LORAZEPAM 0.5 MG PO TABS
0.0000 mg | ORAL_TABLET | Freq: Four times a day (QID) | ORAL | Status: DC
  Administered 2021-02-18 (×2): 2 mg via ORAL
  Filled 2021-02-17: qty 4

## 2021-02-17 MED ORDER — LORAZEPAM 2 MG/ML IJ SOLN: 0.0000 mg | Freq: Four times a day (QID) | INTRAMUSCULAR | Status: DC

## 2021-02-17 MED ORDER — THIAMINE HCL 100 MG/ML IJ SOLN
100.0000 mg | Freq: Every day | INTRAMUSCULAR | Status: DC
Start: 2021-02-18 — End: 2021-02-18
  Filled 2021-02-17: qty 1

## 2021-02-17 MED ORDER — LORAZEPAM 0.5 MG PO TABS: 0.0000 mg | ORAL_TABLET | Freq: Two times a day (BID) | ORAL | Status: DC

## 2021-02-17 MED ORDER — LORAZEPAM 2 MG/ML IJ SOLN: 0.0000 mg | Freq: Two times a day (BID) | INTRAMUSCULAR | Status: DC

## 2021-02-17 MED ORDER — THIAMINE HCL 100 MG PO TABS
100.0000 mg | ORAL_TABLET | Freq: Every day | ORAL | Status: DC
Start: 2021-02-18 — End: 2021-02-18
  Administered 2021-02-18: 100 mg via ORAL
  Filled 2021-02-17: qty 1

## 2021-02-17 NOTE — ED Provider Notes (Signed)
Emergency Medicine Provider Triage Evaluation Note  Erin Lynch , a 60 y.o. female  was evaluated in triage.  Pt complains of suicidal thoughts and anxiety.  Reports that she has been drinking today.  States that she feels very anxious.  Denies drug use.  Reports chronic back pain.  Denies any other new problems.  Review of Systems  Positive: Anxiety, suicidal thoughts Negative: Fever, chills  Physical Exam  BP 112/69 (BP Location: Left Arm)   Pulse 85   Temp 97.8 F (36.6 C) (Oral)   Resp (!) 22   Ht 5\' 6"  (1.676 m)   Wt 68 kg   SpO2 98%   BMI 24.21 kg/m  Gen:   Awake, no distress   Resp:  Normal effort  MSK:   Moves extremities without difficulty  Other:  Seems anxious  Medical Decision Making  Medically screening exam initiated at 10:04 PM.  Appropriate orders placed.  Dezarai Prew was informed that the remainder of the evaluation will be completed by another provider, this initial triage assessment does not replace that evaluation, and the importance of remaining in the ED until their evaluation is complete.  Suicidal thoughts   Lynnell Jude, Roxy Horseman 02/17/21 2205    2206, MD 02/18/21 [phone]

## 2021-02-17 NOTE — ED Triage Notes (Signed)
Pt brought to ED by [facility] for evaluation after dispatched to personal residence for suicidal ideation. Pt reports attempting suicide today because "she is tired of living like this" state she has been shaking for two weeks. Pt tearful at time of triage.

## 2021-02-17 NOTE — ED Notes (Signed)
Augusto Gamble, husband, [phone] would like an update when available

## 2021-02-17 NOTE — BH Assessment (Signed)
TTS spoke to [facility], to put pt in private room to complete assessment, no rooms available at this time. Per Nurse will revisit at midnight to complete assessment.

## 2021-02-18 DIAGNOSIS — F1924 Other psychoactive substance dependence with psychoactive substance-induced mood disorder: Secondary | ICD-10-CM | POA: Diagnosis not present

## 2021-02-18 DIAGNOSIS — F1994 Other psychoactive substance use, unspecified with psychoactive substance-induced mood disorder: Secondary | ICD-10-CM

## 2021-02-18 LAB — RAPID URINE DRUG SCREEN, HOSP PERFORMED
Amphetamines: NOT DETECTED
Barbiturates: NOT DETECTED
Benzodiazepines: POSITIVE — AB
Cocaine: NOT DETECTED
Opiates: NOT DETECTED
Tetrahydrocannabinol: NOT DETECTED

## 2021-02-18 LAB — RESP PANEL BY RT-PCR (FLU A&B, COVID) ARPGX2
Influenza A by PCR: NEGATIVE
Influenza B by PCR: NEGATIVE
SARS Coronavirus 2 by RT PCR: NEGATIVE

## 2021-02-18 MED ORDER — LORAZEPAM 1 MG PO TABS
ORAL_TABLET | ORAL | Status: AC
  Filled 2021-02-18: qty 2

## 2021-02-18 MED ORDER — GABAPENTIN 600 MG PO TABS: 600.0000 mg | ORAL_TABLET | Freq: Every day | ORAL | 0 refills | Status: AC
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
## 2021-02-18 NOTE — ED Notes (Addendum)
Pharmacy called to verify pt prescription order for gabapentin. I explained the pt stated the psychiatrist nurse practitioner will give her the order but no order was placed or note was visible at the time. The charge nurse guided me to go to EDP. EDP provider reviewed the medication listed in pt chart and placed a prescription order. The NP was notified secured chat to contact the pharmacy to clarify order. The NP was paged as well.

## 2021-02-18 NOTE — BH Assessment (Signed)
Clinician call the TTS Cart; however, their was no answer.

## 2021-02-18 NOTE — ED Notes (Signed)
Pt made a second phonecall to her daughter. Will continue to monitor pt.

## 2021-02-18 NOTE — ED Notes (Signed)
Pt's husband wanted to leave 2 phone numbers in case the pt would like to contact them or they would like to talk to the pt.  Milon Dikes, Daughter [phone] Issac Jackson Latino, Son. They will update this family members number when the pt's husband finds out.

## 2021-02-18 NOTE — BH Assessment (Signed)
TTS spoke to [facility],  to put pt Pt in private room and to set up the cart for TTS assessment.  Clinician to call the cart in ten  minutes.

## 2021-02-18 NOTE — ED Provider Notes (Signed)
Cleared by psychiatry.  Discharge.   Virgina Norfolk, DO 02/18/21 1502

## 2021-02-18 NOTE — ED Notes (Signed)
Pt sitting in front of TTS

## 2021-02-18 NOTE — ED Notes (Signed)
Pt husband is at the bedside to take pt home. NT helped pt get belongings and get dressed.

## 2021-02-18 NOTE — ED Provider Notes (Signed)
[facility]   CSN: [id number] Arrival date & time: 02/17/21  2117     History Chief Complaint  Patient presents with   Psychiatric Evaluation    Erin Lynch is a 60 y.o. female.  Patient is a 60 year old female with past medical history of coronary artery disease, CHF, fibromyalgia, depression, and anxiety.  Patient tells me she has been dealing with anxiety for her entire life.  Her doctor took her off of Xanax 3 months ago.  Patient says since that time, her anxiety has worsened.  This evening she states she "could not take it anymore", then expressed the desire to kill herself with a knife.  She states that she was going to slice her wrist, but the knife was not sharp enough.  Patient was brought here for evaluation of this.  She does admit to consuming alcohol this evening.  The history is provided by the patient.      Past Medical History:  Diagnosis Date   Anxiety    Arthritis    Bulging disc    CAD (coronary artery disease)    a. NSTEMI 12/2011: BMS-LAD b. 09/2015: cath showing patent stent w/ mild nonobstructive CAD.   Chronic diastolic CHF (congestive heart failure) (HCC)    Depression    Fibromyalgia    GERD (gastroesophageal reflux disease)    Hyperlipidemia LDL goal < 70    Hypertension    Hypothyroid    Obesity (BMI 30.0-34.9)     Patient Active Problem List   Diagnosis Date Noted   Stricture and stenosis of esophagus 08/14/2020   Hiatal hernia 08/14/2020   Weakness 08/11/2020   Slurred speech    Hypotension    Confusion 02/28/2020   AKI (acute kidney injury) (HCC) 02/28/2020   COPD with acute exacerbation (HCC) 02/28/2020   Sepsis (HCC) 02/25/2020   Aspiration pneumonia (HCC) 01/31/2019   Coronary artery disease involving native coronary artery    Unstable angina (HCC) 10/10/2015   CAD (coronary artery disease) 01/14/2012   Hyperlipidemia 01/14/2012   Bipolar disorder, curr episode depressed,  severe, w/psychotic features (HCC) 01/14/2012   NSTEMI, initial episode of care [facility](HCC) 01/02/2012    Past Surgical History:  Procedure Laterality Date   CARDIAC CATHETERIZATION  01/01/2012    WITH CORONARY ANGIOGRAM, left heart    CARDIAC CATHETERIZATION N/A 10/11/2015   Procedure: Left Heart Cath and Coronary Angiography;  Surgeon: Tonny BollmanMichael Cooper, MD;  [facility];  Service: Cardiovascular;  Laterality: N/A;   CORONARY STENT PLACEMENT     ESOPHAGOGASTRODUODENOSCOPY (EGD) WITH PROPOFOL N/A 08/13/2020   Procedure: ESOPHAGOGASTRODUODENOSCOPY (EGD) WITH PROPOFOL;  Surgeon: Jeani HawkingHung, Patrick, MD;  [facility];  Service: Endoscopy;  Laterality: N/A;   LEFT HEART CATHETERIZATION WITH CORONARY ANGIOGRAM N/A 01/01/2012   Procedure: LEFT HEART CATHETERIZATION WITH CORONARY ANGIOGRAM;  Surgeon: Iran OuchMuhammad A Arida, MD;  Location: [facility] CATH LAB;  Service: Cardiovascular;  Laterality: N/A;   SAVORY DILATION N/A 08/13/2020   Procedure: SAVORY DILATION;  Surgeon: Jeani HawkingHung, Patrick, MD;  [facility];  Service: Endoscopy;  Laterality: N/A;     OB History   No obstetric history on file.     Family History  Problem Relation Age of Onset   Heart attack Mother    Heart attack Father     Social History   Tobacco Use   Smoking status: Every Day    Packs/day: 1.00    Years: 25.00    Pack years: 25.00  Types: Cigarettes   Smokeless tobacco: Never   Tobacco comments:    stop june 2013  Substance Use Topics   Alcohol use: No   Drug use: No    Home Medications Prior to Admission medications   Medication Sig Start Date End Date Taking? Authorizing Provider  acetaminophen (TYLENOL) 325 MG tablet Take 650 mg by mouth every 6 (six) hours as needed.   Yes [provider]  clopidogrel (PLAVIX) 75 MG tablet TAKE 1 TABLET BY MOUTH EVERY DAY Patient taking differently: No sig reported 05/04/13  Yes Laurey Morale, MD  cyanocobalamin 1000 MCG tablet Take 1,000 mcg by  mouth daily. 08/30/19  Yes [provider]  Doxepin HCl 3 MG TABS Take 1 tablet by mouth at bedtime. 12/06/20  Yes [provider]  escitalopram (LEXAPRO) 20 MG tablet Take 20 mg by mouth daily. 08/01/20  Yes [provider]  famotidine (PEPCID) 20 MG tablet Take 20 mg by mouth daily as needed for heartburn or indigestion. 12/24/20  Yes [provider]  furosemide (LASIX) 40 MG tablet Take 1 tablet (40 mg total) by mouth daily as needed for fluid or edema. 08/14/20  Yes Alphonzo Severance, MD  gabapentin (NEURONTIN) 600 MG tablet Take 600 mg by mouth at bedtime.    Yes [provider]  ibuprofen (ADVIL) 200 MG tablet Take 400 mg by mouth every 6 (six) hours as needed for headache or moderate pain.   Yes [provider]  ipratropium-albuterol (DUONEB) 0.5-2.5 (3) MG/3ML SOLN Take 3 mLs by nebulization 4 (four) times daily as needed. 11/30/20  Yes [provider]  LATUDA 40 MG TABS tablet Take 40 mg by mouth daily.  09/30/15  Yes [provider]  levothyroxine (SYNTHROID) 100 MCG tablet Take 100 mcg by mouth daily. 10/09/15  Yes [provider]  lisinopril (PRINIVIL,ZESTRIL) 5 MG tablet Take 5 mg by mouth daily. 07/20/15  Yes [provider]  metoprolol succinate (TOPROL-XL) 25 MG 24 hr tablet Take 25 mg by mouth daily.   Yes [provider]  nitroGLYCERIN (NITROSTAT) 0.4 MG SL tablet Place 1 tablet (0.4 mg total) under the tongue every 5 (five) minutes as needed for chest pain. Patient taking differently: Place 0.4 mg under the tongue every 5 (five) minutes as needed for chest pain (max 3 doses). 01/02/12 02/18/21 Yes Barrett, Joline Salt, PA-C  omeprazole (PRILOSEC) 40 MG capsule Take 40 mg by mouth daily. 12/24/20  Yes [provider]  rosuvastatin (CRESTOR) 10 MG tablet Take 10 mg by mouth daily. 02/05/20  Yes [provider]  traZODone (DESYREL) 100 MG tablet Take 100 mg by mouth at bedtime as needed for  sleep. 08/01/20  Yes [provider]  TRELEGY ELLIPTA 100-62.5-25 MCG/INH AEPB Inhale 1 puff into the lungs daily. 01/18/19  Yes [provider]  VENTOLIN HFA 108 (90 Base) MCG/ACT inhaler Inhale 2 puffs into the lungs every 6 (six) hours as needed for shortness of breath. 12/30/18  Yes [provider]  Vitamin D3 (VITAMIN D) 25 MCG tablet Take 1,000 Units by mouth daily.   Yes [provider]  aspirin EC 81 MG EC tablet Take 1 tablet (81 mg total) by mouth daily. Patient not taking: No sig reported 10/11/15   Iran Ouch, Lennart Pall, PA-C  nicotine (NICODERM CQ - DOSED IN MG/24 HOURS) 21 mg/24hr patch Place 1 patch (21 mg total) onto the skin daily. Patient not taking: No sig reported 02/03/19   Joycelyn Das, MD  pantoprazole (PROTONIX) 40 MG tablet Take 1 tablet (40 mg total) by mouth daily. Patient not taking: Reported on 02/18/2021 08/14/20 09/13/20  Alphonzo Severance, MD    Allergies    Patient has no known allergies.  Review of Systems   Review of Systems  All other systems reviewed and are negative.  Physical Exam Updated Vital Signs BP 112/69 (BP Location: Left Arm)   Pulse 85   Temp 97.8 F (36.6 C) (Oral)   Resp (!) 22   Ht 5\' 6"  (1.676 m)   Wt 68 kg   SpO2 98%   BMI 24.21 kg/m   Physical Exam Vitals and nursing note reviewed.  Constitutional:      General: She is not in acute distress.    Appearance: She is well-developed. She is not diaphoretic.  HENT:     Head: Normocephalic and atraumatic.  Cardiovascular:     Rate and Rhythm: Normal rate and regular rhythm.     Heart sounds: No murmur heard.   No friction rub. No gallop.  Pulmonary:     Effort: Pulmonary effort is normal. No respiratory distress.     Breath sounds: Normal breath sounds. No wheezing.  Abdominal:     General: Bowel sounds are normal. There is no distension.     Palpations: Abdomen is soft.     Tenderness: There is no abdominal tenderness.  Musculoskeletal:         General: Normal range of motion.     Cervical back: Normal range of motion and neck supple.  Skin:    General: Skin is warm and dry.  Neurological:     General: No focal deficit present.     Mental Status: She is alert and oriented to person, place, and time.    ED Results / Procedures / Treatments   Labs (all labs ordered are listed, but only abnormal results are displayed) Labs Reviewed  COMPREHENSIVE METABOLIC PANEL - Abnormal; Notable for the following components:      Result Value   Potassium 3.0 (*)    CO2 19 (*)    All other components within normal limits  ETHANOL - Abnormal; Notable for the following components:   Alcohol, Ethyl (B) 159 (*)    All other components within normal limits  CBC WITH DIFFERENTIAL/PLATELET - Abnormal; Notable for the following components:   WBC 11.3 (*)    Platelets 471 (*)    Lymphs Abs 4.2 (*)    All other components within normal limits  SALICYLATE LEVEL - Abnormal; Notable for the following components:   Salicylate Lvl <7.0 (*)    All other components within normal limits  ACETAMINOPHEN LEVEL - Abnormal; Notable for the following components:   Acetaminophen (Tylenol), Serum <10 (*)    All other components within normal limits  RESP PANEL BY RT-PCR (FLU A&B, COVID) ARPGX2  RAPID URINE DRUG SCREEN, HOSP PERFORMED    EKG None  Radiology No results found.  Procedures Procedures   Medications Ordered in ED Medications  LORazepam (ATIVAN) injection 0-4 mg (has no administration in time range)    Or  LORazepam (ATIVAN) tablet 0-4 mg (has no administration in time range)  LORazepam (ATIVAN) injection 0-4 mg (has no administration in time range)    Or  LORazepam (ATIVAN) tablet 0-4 mg (has no administration in time range)  thiamine tablet 100 mg (has no administration in time range)    Or  thiamine (B-1) injection 100 mg (has no administration in time range)  ED Course  I have reviewed the triage vital signs and the nursing  notes.  Pertinent labs & imaging results that were available during my care of the patient were reviewed by me and considered in my medical decision making (see chart for details).    MDM Rules/Calculators/A&P  Patient presenting here with complaints of suicidal ideation.  She appears medically cleared and is awaiting TTS to assist in determining the final disposition.  Final Clinical Impression(s) / ED Diagnoses Final diagnoses:  None    Rx / DC Orders ED Discharge Orders     None        Geoffery Lyons, MD 02/21/21 [phone]

## 2021-02-18 NOTE — Consult Note (Addendum)
Telepsych Consultation   Reason for Consult:  Psychiatric Reassessment Referring Physician:  Dr. Geoffery Lyons Location of Patient:   [facility] Location of Provider: Other: virtual home office  Patient Identification: Erin Lynch MRN:  [id number] Principal Diagnosis: Substance induced mood disorder (HCC) Diagnosis:  Principal Problem:   Substance induced mood disorder (HCC)   Total Time spent with patient: 30 minutes  Subjective:   Erin Lynch is a 60 y.o. female patient seen by psychiatry and recommended for overnight observation and reassessment by psychiatry today.  "Patient states, I am not suicidal, I just did that for my husband's attention."    Patient seen via telepsych by this provider; chart reviewed and consulted with Dr. Jannifer Franklin on 02/18/21.  On evaluation Erin Lynch reports she used to take xanax for the past 30 years, weaned by her primary care provider 3 weeks prior.  She reports her anxiety was managed with xanax but when she stopped taking it she became more anxious and began having shakes.  She decided to drink beer to help with the shakes and became very impulsive.  States,"it was the alcohol that made me act that way." BAL on admission was 159. States she has no intention of hurting herself just behaved in a manner to "get my husband's attention." She acknowledges this was not the best option to get her husband's attention but denies suicidal ideations, plan or intent.  She has never engaged in self injurious behaviors; no prior suicide attempts and no record for inpatient psychiatric care.  Also denies hx for alcohol or substance abuse concerns.  She is future oriented and relates her plan to follow-up with outpatient psychiatry in [place], [place] on 02/19/2021.for additional meds/therapy for anxiety.  States she will abstain from alcohol as she is aware this drives impulsivity.    Collateral: I spoke with the patient's spouse, Erin Lynch and her daughter, Erin Lynch  neither have safety concerns with patient being discharged home today.  Both collaborate that patient is "attention seeking" and has a history of doing things for attention.  Spouse states, "she thinks she is a queen and we are her servants, but she's never tried to hurt herself. "   HPI:  Per EDP Admission Assessment  02/17/2021: Erin Lynch , a 60 y.o. female  was evaluated in triage.  Pt complains of suicidal thoughts and anxiety.  Reports that she has been drinking today.  States that she feels very anxious.  Denies drug use.  Reports chronic back pain.  Denies any other new problems.   Past Psychiatric History: GAD  Risk to Self:  no Risk to Others:  no Prior Inpatient Therapy:  no Prior Outpatient Therapy:  no  Past Medical History:  Past Medical History:  Diagnosis Date   Anxiety    Arthritis    Bulging disc    CAD (coronary artery disease)    a. NSTEMI 12/2011: BMS-LAD b. 09/2015: cath showing patent stent w/ mild nonobstructive CAD.   Chronic diastolic CHF (congestive heart failure) (HCC)    Depression    Fibromyalgia    GERD (gastroesophageal reflux disease)    Hyperlipidemia LDL goal < 70    Hypertension    Hypothyroid    Obesity (BMI 30.0-34.9)     Past Surgical History:  Procedure Laterality Date   CARDIAC CATHETERIZATION  01/01/2012    WITH CORONARY ANGIOGRAM, left heart    CARDIAC CATHETERIZATION N/A 10/11/2015   Procedure: Left Heart Cath and Coronary Angiography;  Surgeon: Casimiro Needle  Excell Seltzer, MD;  Location: [facility] INVASIVE CV LAB;  Service: Cardiovascular;  Laterality: N/A;   CORONARY STENT PLACEMENT     ESOPHAGOGASTRODUODENOSCOPY (EGD) WITH PROPOFOL N/A 08/13/2020   Procedure: ESOPHAGOGASTRODUODENOSCOPY (EGD) WITH PROPOFOL;  Surgeon: Jeani Hawking, MD;  Location: [facility] ENDOSCOPY;  Service: Endoscopy;  Laterality: N/A;   LEFT HEART CATHETERIZATION WITH CORONARY ANGIOGRAM N/A 01/01/2012   Procedure: LEFT HEART CATHETERIZATION WITH CORONARY ANGIOGRAM;  Surgeon: Iran Ouch,  MD;  Location: [facility] CATH LAB;  Service: Cardiovascular;  Laterality: N/A;   SAVORY DILATION N/A 08/13/2020   Procedure: SAVORY DILATION;  Surgeon: Jeani Hawking, MD;  Location: [facility] ENDOSCOPY;  Service: Endoscopy;  Laterality: N/A;   Family History:  Family History  Problem Relation Age of Onset   Heart attack Mother    Heart attack Father    Family Psychiatric  History: unknown Social History:  Social History   Substance and Sexual Activity  Alcohol Use No     Social History   Substance and Sexual Activity  Drug Use No    Social History   Socioeconomic History   Marital status: Single    Spouse name: Not on file   Number of children: 2   Years of education: Not on file   Highest education level: Not on file  Occupational History   Occupation: retired    Comment: Psychiatric nurse  Tobacco Use   Smoking status: Every Day    Packs/day: 1.00    Years: 25.00    Pack years: 25.00    Types: Cigarettes   Smokeless tobacco: Never   Tobacco comments:    stop june 2013  Substance and Sexual Activity   Alcohol use: No   Drug use: No   Sexual activity: Not on file  Other Topics Concern   Not on file  Social History Narrative   Not on file   Social Determinants of Health   Financial Resource Strain: Not on file  Food Insecurity: Not on file  Transportation Needs: Not on file  Physical Activity: Not on file  Stress: Not on file  Social Connections: Not on file   Additional Social History:    Allergies:  No Known Allergies  Labs:  Results for orders placed or performed during the hospital encounter of 02/17/21 (from the past 48 hour(s))  Comprehensive metabolic panel     Status: Abnormal   Collection Time: 02/17/21 10:05 PM  Result Value Ref Range   Sodium 137 135 - 145 mmol/L   Potassium 3.0 (L) 3.5 - 5.1 mmol/L   Chloride 106 98 - 111 mmol/L   CO2 19 (L) 22 - 32 mmol/L   Glucose, Bld 96 70 - 99 mg/dL    Comment: Glucose reference range applies only to samples  taken after fasting for at least 8 hours.   BUN 8 8 - 23 mg/dL   Creatinine, Ser 1.61 0.44 - 1.00 mg/dL   Calcium 8.9 8.9 - 09.6 mg/dL   Total Protein 6.6 6.5 - 8.1 g/dL   Albumin 3.5 3.5 - 5.0 g/dL   AST 18 15 - 41 U/L   ALT 14 0 - 44 U/L   Alkaline Phosphatase 107 38 - 126 U/L   Total Bilirubin 0.4 0.3 - 1.2 mg/dL   GFR, Estimated >04 >54 mL/min    Comment: (NOTE) Calculated using the CKD-EPI Creatinine Equation (2021)    Anion gap 12 5 - 15    Comment: Performed at [facility], [address] [place], [place]  [address]  Ethanol     Status: Abnormal   Collection Time: 02/17/21 10:05 PM  Result Value Ref Range   Alcohol, Ethyl (B) 159 (H) <10 mg/dL    Comment: (NOTE) Lowest detectable limit for serum alcohol is 10 mg/dL.  For medical purposes only. Performed at [facility], [address], [place], [place] [address]   CBC with Diff     Status: Abnormal   Collection Time: 02/17/21 10:05 PM  Result Value Ref Range   WBC 11.3 (H) 4.0 - 10.5 K/uL   RBC 4.29 3.87 - 5.11 MIL/uL   Hemoglobin 13.3 12.0 - 15.0 g/dL   HCT 30.8 65.7 - 84.6 %   MCV 92.5 80.0 - 100.0 fL   MCH 31.0 26.0 - 34.0 pg   MCHC 33.5 30.0 - 36.0 g/dL   RDW 96.2 95.2 - 84.1 %   Platelets 471 (H) 150 - 400 K/uL   nRBC 0.0 0.0 - 0.2 %   Neutrophils Relative % 52 %   Neutro Abs 6.0 1.7 - 7.7 K/uL   Lymphocytes Relative 38 %   Lymphs Abs 4.2 (H) 0.7 - 4.0 K/uL   Monocytes Relative 7 %   Monocytes Absolute 0.8 0.1 - 1.0 K/uL   Eosinophils Relative 2 %   Eosinophils Absolute 0.2 0.0 - 0.5 K/uL   Basophils Relative 1 %   Basophils Absolute 0.1 0.0 - 0.1 K/uL   Immature Granulocytes 0 %   Abs Immature Granulocytes 0.05 0.00 - 0.07 K/uL    Comment: Performed at [facility], [address] [place], [place] [address]  Salicylate level     Status: Abnormal   Collection Time: 02/17/21 10:05 PM  Result Value Ref Range   Salicylate Lvl <7.0 (L) 7.0 - 30.0 mg/dL    Comment: Performed at [facility], [address] [place], [place] [address]  Acetaminophen level     Status: Abnormal   Collection Time: 02/17/21 10:05 PM  Result Value Ref Range   Acetaminophen (Tylenol), Serum <10 (L) 10 - 30 ug/mL    Comment: (NOTE) Therapeutic concentrations vary significantly. A range of 10-30 ug/mL  may be an effective concentration for many patients. However, some  are best treated at concentrations outside of this range. Acetaminophen concentrations >150 ug/mL at 4 hours after ingestion  and >50 ug/mL at 12 hours after ingestion are often associated with  toxic reactions.  Performed at [facility], [address] [place], [place] [address]   Resp Panel by RT-PCR (Flu A&B, Covid) Nasopharyngeal Swab     Status: None   Collection Time: 02/17/21 11:42 PM   Specimen: Nasopharyngeal Swab; Nasopharyngeal(NP) swabs in vial transport medium  Result Value Ref Range   SARS Coronavirus 2 by RT PCR NEGATIVE NEGATIVE    Comment: (NOTE) SARS-CoV-2 target nucleic acids are NOT DETECTED.  The SARS-CoV-2 RNA is generally detectable in upper respiratory specimens during the acute phase of infection. The lowest concentration of SARS-CoV-2 viral copies this assay can detect is 138 copies/mL. A negative result does not preclude SARS-Cov-2 infection and should not be used as the sole basis for treatment or other patient management decisions. A negative result may occur with  improper specimen collection/handling, submission of specimen other than nasopharyngeal swab, presence of viral mutation(s) within the areas targeted by this assay, and inadequate number of viral copies(<138 copies/mL). A negative result must be combined with clinical observations, patient history, and epidemiological information. The expected  result is Negative.  Fact Sheet for Patients:  BloggerCourse.com  Fact Sheet for Healthcare Providers:   SeriousBroker.it  This test is no t yet approved or cleared by the [place] FDA and  has been authorized for detection and/or diagnosis of SARS-CoV-2 by FDA under an Emergency Use Authorization (EUA). This EUA will remain  in effect (meaning this test can be used) for the duration of the COVID-19 declaration under Section 564(b)(1) of the Act, 21 U.S.C.section 360bbb-3(b)(1), unless the authorization is terminated  or revoked sooner.       Influenza A by PCR NEGATIVE NEGATIVE   Influenza B by PCR NEGATIVE NEGATIVE    Comment: (NOTE) The Xpert Xpress SARS-CoV-2/FLU/RSV plus assay is intended as an aid in the diagnosis of influenza from Nasopharyngeal swab specimens and should not be used as a sole basis for treatment. Nasal washings and aspirates are unacceptable for Xpert Xpress SARS-CoV-2/FLU/RSV testing.  Fact Sheet for Patients: BloggerCourse.com  Fact Sheet for Healthcare Providers: SeriousBroker.it  This test is not yet approved or cleared by the [place] FDA and has been authorized for detection and/or diagnosis of SARS-CoV-2 by FDA under an Emergency Use Authorization (EUA). This EUA will remain in effect (meaning this test can be used) for the duration of the COVID-19 declaration under Section 564(b)(1) of the Act, 21 U.S.C. section 360bbb-3(b)(1), unless the authorization is terminated or revoked.  Performed at [facility], [address] [place], [place] [address]   Urine rapid drug screen (hosp performed)     Status: Abnormal   Collection Time: 02/18/21  1:25 PM  Result Value Ref Range   Opiates NONE DETECTED NONE DETECTED   Cocaine NONE DETECTED NONE DETECTED   Benzodiazepines POSITIVE (A) NONE DETECTED   Amphetamines NONE DETECTED NONE DETECTED   Tetrahydrocannabinol NONE DETECTED NONE DETECTED   Barbiturates NONE DETECTED NONE DETECTED    Comment: (NOTE) DRUG  SCREEN FOR MEDICAL PURPOSES ONLY.  IF CONFIRMATION IS NEEDED FOR ANY PURPOSE, NOTIFY LAB WITHIN 5 DAYS.  LOWEST DETECTABLE LIMITS FOR URINE DRUG SCREEN Drug Class                     Cutoff (ng/mL) Amphetamine and metabolites    1000 Barbiturate and metabolites    200 Benzodiazepine                 200 Tricyclics and metabolites     300 Opiates and metabolites        300 Cocaine and metabolites        300 THC                            50 Performed at [facility], [address] [place], [place] [address]     Medications:  Current Facility-Administered Medications  Medication Dose Route Frequency Provider Last Rate Last Admin   LORazepam (ATIVAN) injection 0-4 mg  0-4 mg Intravenous Q6H Roxy Horseman, PA-C       Or   LORazepam (ATIVAN) tablet 0-4 mg  0-4 mg Oral Q6H Roxy Horseman, PA-C   2 mg at 02/18/21 1015   [START ON 02/20/2021] LORazepam (ATIVAN) injection 0-4 mg  0-4 mg Intravenous Q12H Roxy Horseman, PA-C       Or   [START ON 02/20/2021] LORazepam (ATIVAN) tablet 0-4 mg  0-4 mg Oral Q12H Roxy Horseman, PA-C       thiamine tablet 100 mg  100 mg Oral Daily Roxy Horseman, PA-C   100 mg at 02/18/21 1030   Or   thiamine (B-1) injection 100 mg  100 mg Intravenous Daily Roxy Horseman, PA-C       Current Outpatient Medications  Medication Sig Dispense Refill   acetaminophen (TYLENOL) 325 MG tablet Take 650 mg by mouth every 6 (six) hours as needed.     clopidogrel (PLAVIX) 75 MG tablet TAKE 1 TABLET BY MOUTH EVERY DAY (Patient taking differently: No sig reported) 15 tablet 0   cyanocobalamin 1000 MCG tablet Take 1,000 mcg by mouth daily.     Doxepin HCl 3 MG TABS Take 1 tablet by mouth at bedtime.     escitalopram (LEXAPRO) 20 MG tablet Take 20 mg by mouth daily.     famotidine (PEPCID) 20 MG tablet Take 20 mg by mouth daily as needed for heartburn or indigestion.     furosemide (LASIX) 40 MG tablet Take 1 tablet (40 mg total) by mouth daily as needed for  fluid or edema. 30 tablet 0   ibuprofen (ADVIL) 200 MG tablet Take 400 mg by mouth every 6 (six) hours as needed for headache or moderate pain.     ipratropium-albuterol (DUONEB) 0.5-2.5 (3) MG/3ML SOLN Take 3 mLs by nebulization 4 (four) times daily as needed.     LATUDA 40 MG TABS tablet Take 40 mg by mouth daily.   0   levothyroxine (SYNTHROID) 100 MCG tablet Take 100 mcg by mouth daily.     lisinopril (PRINIVIL,ZESTRIL) 5 MG tablet Take 5 mg by mouth daily.  1   metoprolol succinate (TOPROL-XL) 25 MG 24 hr tablet Take 25 mg by mouth daily.     nitroGLYCERIN (NITROSTAT) 0.4 MG SL tablet Place 1 tablet (0.4 mg total) under the tongue every 5 (five) minutes as needed for chest pain. (Patient taking differently: Place 0.4 mg under the tongue every 5 (five) minutes as needed for chest pain (max 3 doses).) 25 tablet 3   omeprazole (PRILOSEC) 40 MG capsule Take 40 mg by mouth daily.     rosuvastatin (CRESTOR) 10 MG tablet Take 10 mg by mouth daily.     traZODone (DESYREL) 100 MG tablet Take 100 mg by mouth at bedtime as needed for sleep.     TRELEGY ELLIPTA 100-62.5-25 MCG/INH AEPB Inhale 1 puff into the lungs daily.     VENTOLIN HFA 108 (90 Base) MCG/ACT inhaler Inhale 2 puffs into the lungs every 6 (six) hours as needed for shortness of breath.     Vitamin D3 (VITAMIN D) 25 MCG tablet Take 1,000 Units by mouth daily.     aspirin EC 81 MG EC tablet Take 1 tablet (81 mg total) by mouth daily. (Patient not taking: No sig reported)     gabapentin (NEURONTIN) 600 MG tablet Take 1 tablet (600 mg total) by mouth at bedtime. 30 tablet 0   nicotine (NICODERM CQ - DOSED IN MG/24 HOURS) 21 mg/24hr patch Place 1 patch (21 mg total) onto the skin daily. (Patient not taking: No sig reported) 28 patch 0   pantoprazole (PROTONIX) 40 MG tablet Take 1 tablet (40 mg total) by mouth daily. (Patient not taking: Reported on 02/18/2021) 30 tablet 0    Musculoskeletal: Strength & Muscle Tone: within normal limits Gait &  Station: normal Patient leans: N/A   Psychiatric Specialty Exam:  Presentation  General Appearance: Appropriate for Environment  Eye Contact:Good  Speech:Clear and Coherent; Normal Rate  Speech Volume:Normal  Handedness:Right  Mood and Affect  Mood:Anxious  Affect:Congruent   Thought Process  Thought Processes:Coherent; Goal Directed  Descriptions of Associations:Intact  Orientation:Full (Time, Place and Person)  Thought Content:Logical (improved since admission)  History of Schizophrenia/Schizoaffective disorder:No  Duration of Psychotic Symptoms:N/A  Hallucinations:Hallucinations: None  Ideas of Reference:None  Suicidal Thoughts:Suicidal Thoughts: No  Homicidal Thoughts:Homicidal Thoughts: No   Sensorium  Memory:Immediate Good; Recent Good; Remote Good  Judgment:Fair (at baseline)  Insight:Fair (at baseline in the setting of alcohol use to self medication for anxiety)   Executive Functions  Concentration:Good  Attention Span:Good  Recall:Good  Fund of Knowledge:Good  Language:Good   Psychomotor Activity  Psychomotor Activity:Psychomotor Activity: Tremor   Assets  Assets:Communication Skills; Desire for Improvement; Housing; Social Support   Sleep  Sleep:Sleep: Good Number of Hours of Sleep: 6    Physical Exam: Physical Exam HENT:     Head: Normocephalic.     Nose: Nose normal.  Cardiovascular:     Rate and Rhythm: Normal rate.     Pulses: Normal pulses.  Pulmonary:     Effort: Pulmonary effort is normal.  Musculoskeletal:        General: Normal range of motion.     Cervical back: Normal range of motion.  Neurological:     General: No focal deficit present.     Mental Status: She is alert and oriented to person, place, and time.  Psychiatric:        Thought Content: Thought content normal.   Review of Systems  Constitutional: Negative.   HENT: Negative.    Eyes: Negative.   Respiratory: Negative.     Cardiovascular: Negative.   Gastrointestinal: Negative.   Genitourinary: Negative.   Musculoskeletal: Negative.   Skin: Negative.   Neurological:  Positive for tremors.  Endo/Heme/Allergies: Negative.   Psychiatric/Behavioral:  Negative for hallucinations and suicidal ideas. The patient is nervous/anxious.   Blood pressure (!) 133/55, pulse 95, temperature 97.8 F (36.6 C), temperature source Oral, resp. rate 20, height 5\' 6"  (1.676 m), weight 68 kg, SpO2 94 %. Body mass index is 24.21 kg/m.  Treatment Plan Summary: Plan- As per above assessment, there are no current grounds for involuntary commitment at this time.  Patient is future oriented and relates her plan to follow-up with outpatient psychiatry on 02/19/2021 for new patient visit for GAD. I have agreed to call one week supply of gabapentin 300mg  po TID to the [facility] pharmacy to help with anxiety.  Additionally, she should continue home medication of escitalopram 20mg  po daily.   Patient is not currently interested in inpatient services but expresses agreement to continue outpatient treatment. We have reviewed the importance of substance abuse abstinence, potential negative impact substance abuse can have on his relationships and level of functioning, and importance of medication compliance.  Disposition: No evidence of imminent risk to self or others at present.   Patient does not meet criteria for psychiatric inpatient admission. Supportive therapy provided about ongoing stressors. Discussed crisis plan, support from social network, calling 911, coming to the Emergency Department, and calling Suicide Hotline.   Spoke with Dr. Daun PeacockAdam Curtalo, EDP; Crissie ReeseJamaral Rease, LCSW informed of above recommendation and disposition  This service was provided via telemedicine using a 2-way, interactive audio and video technology.  Names of all persons participating in this telemedicine service and their role in this encounter. Name: Erin JudeJoan Lynch Role:  Patient  Name: Ophelia ShoulderShnese Chawn Spraggins Role: PMHNP    Chales AbrahamsShnese E Kaycee Mcgaugh, NP 02/18/2021 4:16 PM

## 2021-02-18 NOTE — ED Notes (Signed)
Pt called her daughter. Will continue to monitor pt.

## 2021-02-18 NOTE — ED Notes (Signed)
Pt's husband is visiting the pt. Will continue to monitor pt

## 2021-10-03 ENCOUNTER — Encounter: Payer: Self-pay | Admitting: Pulmonary Disease

## 2021-10-03 ENCOUNTER — Ambulatory Visit (INDEPENDENT_AMBULATORY_CARE_PROVIDER_SITE_OTHER): Payer: Medicare HMO

## 2021-10-03 ENCOUNTER — Ambulatory Visit (INDEPENDENT_AMBULATORY_CARE_PROVIDER_SITE_OTHER): Payer: Medicare HMO | Admitting: Pulmonary Disease

## 2021-10-03 ENCOUNTER — Other Ambulatory Visit: Payer: Self-pay

## 2021-10-03 VITALS — BP 130/74 | HR 72 | Temp 98.0°F | Ht 63.5 in | Wt 150.0 lb

## 2021-10-03 DIAGNOSIS — J4489 Other specified chronic obstructive pulmonary disease: Secondary | ICD-10-CM

## 2021-10-03 DIAGNOSIS — J449 Chronic obstructive pulmonary disease, unspecified: Secondary | ICD-10-CM | POA: Diagnosis not present

## 2021-10-03 DIAGNOSIS — R0602 Shortness of breath: Secondary | ICD-10-CM

## 2021-10-03 NOTE — Patient Instructions (Signed)
We will get records from your recent hospitalization and primary care ?Get chest x-ray today ?Continue Trelegy and nebulizer ?Schedule PFTs and follow-up in 1 to 2 months. ?

## 2021-10-03 NOTE — Progress Notes (Signed)
? ?      ?Erin Lynch    [id number]    07-26-1960 ? ?Primary Care Physician:Hague, Rosalyn Charters, MD ? ?Referring Physician: Bonnita Nasuti, MD ?[address] ?[place],  [place]  [address] ? ?Chief complaint: Follow-up after hospitalization for pneumonia, complicated parapneumonic effusion ? ?HPI: ?61 year old with history of COPD, coronary artery disease, CHF, fibromyalgia, alcohol abuse depression and anxiety.  She was admitted to [facility] on 09/12/2021 with septic shock, multilobar pneumonia.  Admitted to the medical ICU needing vasopressors.  She required chest tube with tPA, DNase on 3 days for drainage of empyema.  There is also concern for malignancy and needs follow-up imaging to resolution.Marland Kitchen  Post discharge she had a syncopal episode and was evaluated at [facility] ED on 09/24/2021 but did not require admission.  CT head did not show any acute abnormalities. ? ?Today in the office she continues to have dyspnea on exertion, cough but improved from before.  Has chronic dyspnea on exertion, congestion.  Occasional wheeze. ? ?Pets: 2 dogs, cats ?Occupation: On disability ?Exposures: No mold, hot tub, Jacuzzi.  No feather pillows or comforters ?Smoking history:25-50 pack-year smoker.  Quit in March 2023 ?Travel history: No significant travel history ?Relevant family history: No family history of lung disease ? ?Outpatient Encounter Medications as of 10/03/2021  ?Medication Sig  ? acetaminophen (TYLENOL) 325 MG tablet Take 650 mg by mouth every 6 (six) hours as needed.  ? aspirin EC 81 MG EC tablet Take 1 tablet (81 mg total) by mouth daily. (Patient not taking: No sig reported)  ? clopidogrel (PLAVIX) 75 MG tablet TAKE 1 TABLET BY MOUTH EVERY DAY (Patient taking differently: No sig reported)  ? cyanocobalamin 1000 MCG tablet Take 1,000 mcg by mouth daily.  ? Doxepin HCl 3 MG TABS Take 1 tablet by mouth at bedtime.  ? escitalopram (LEXAPRO) 20 MG tablet Take 20 mg by mouth daily.  ? famotidine  (PEPCID) 20 MG tablet Take 20 mg by mouth daily as needed for heartburn or indigestion.  ? furosemide (LASIX) 40 MG tablet Take 1 tablet (40 mg total) by mouth daily as needed for fluid or edema.  ? gabapentin (NEURONTIN) 600 MG tablet Take 1 tablet (600 mg total) by mouth at bedtime.  ? ibuprofen (ADVIL) 200 MG tablet Take 400 mg by mouth every 6 (six) hours as needed for headache or moderate pain.  ? ipratropium-albuterol (DUONEB) 0.5-2.5 (3) MG/3ML SOLN Take 3 mLs by nebulization 4 (four) times daily as needed.  ? LATUDA 40 MG TABS tablet Take 40 mg by mouth daily.   ? levothyroxine (SYNTHROID) 100 MCG tablet Take 100 mcg by mouth daily.  ? lisinopril (PRINIVIL,ZESTRIL) 5 MG tablet Take 5 mg by mouth daily.  ? metoprolol succinate (TOPROL-XL) 25 MG 24 hr tablet Take 25 mg by mouth daily.  ? nicotine (NICODERM CQ - DOSED IN MG/24 HOURS) 21 mg/24hr patch Place 1 patch (21 mg total) onto the skin daily. (Patient not taking: No sig reported)  ? nitroGLYCERIN (NITROSTAT) 0.4 MG SL tablet Place 1 tablet (0.4 mg total) under the tongue every 5 (five) minutes as needed for chest pain. (Patient taking differently: Place 0.4 mg under the tongue every 5 (five) minutes as needed for chest pain (max 3 doses).)  ? omeprazole (PRILOSEC) 40 MG capsule Take 40 mg by mouth daily.  ? pantoprazole (PROTONIX) 40 MG tablet Take 1 tablet (40 mg total) by mouth daily. (Patient not taking: Reported on 02/18/2021)  ? rosuvastatin (  CRESTOR) 10 MG tablet Take 10 mg by mouth daily.  ? TRELEGY ELLIPTA 100-62.5-25 MCG/INH AEPB Inhale 1 puff into the lungs daily.  ? VENTOLIN HFA 108 (90 Base) MCG/ACT inhaler Inhale 2 puffs into the lungs every 6 (six) hours as needed for shortness of breath.  ? Vitamin D3 (VITAMIN D) 25 MCG tablet Take 1,000 Units by mouth daily.  ? [DISCONTINUED] traZODone (DESYREL) 100 MG tablet Take 100 mg by mouth at bedtime as needed for sleep.  ? ?No facility-administered encounter medications on file as of 10/03/2021.   ? ? ?Allergies as of 10/03/2021  ? (No Known Allergies)  ? ? ?Past Medical History:  ?Diagnosis Date  ? Anxiety   ? Arthritis   ? Bulging disc   ? CAD (coronary artery disease)   ? a. NSTEMI 12/2011: BMS-LAD b. 09/2015: cath showing patent stent w/ mild nonobstructive CAD.  ? Chronic diastolic CHF (congestive heart failure) ([place])   ? Depression   ? Fibromyalgia   ? GERD (gastroesophageal reflux disease)   ? Hyperlipidemia LDL goal < 70   ? Hypertension   ? Hypothyroid   ? Obesity (BMI 30.0-34.9)   ? ? ?Past Surgical History:  ?Procedure Laterality Date  ? CARDIAC CATHETERIZATION  01/01/2012  ?  WITH CORONARY ANGIOGRAM, left heart   ? CARDIAC CATHETERIZATION N/A 10/11/2015  ? Procedure: Left Heart Cath and Coronary Angiography;  Surgeon: Sherren Mocha, MD;  Location: [facility];  Service: Cardiovascular;  Laterality: N/A;  ? CORONARY STENT PLACEMENT    ? ESOPHAGOGASTRODUODENOSCOPY (EGD) WITH PROPOFOL N/A 08/13/2020  ? Procedure: ESOPHAGOGASTRODUODENOSCOPY (EGD) WITH PROPOFOL;  Surgeon: Carol Ada, MD;  Location: [facility];  Service: Endoscopy;  Laterality: N/A;  ? LEFT HEART CATHETERIZATION WITH CORONARY ANGIOGRAM N/A 01/01/2012  ? Procedure: LEFT HEART CATHETERIZATION WITH CORONARY ANGIOGRAM;  Surgeon: Wellington Hampshire, MD;  Location: [facility];  Service: Cardiovascular;  Laterality: N/A;  ? SAVORY DILATION N/A 08/13/2020  ? Procedure: SAVORY DILATION;  Surgeon: Carol Ada, MD;  Location: [facility];  Service: Endoscopy;  Laterality: N/A;  ? ? ?Family History  ?Problem Relation Age of Onset  ? Heart attack Mother   ? Heart attack Father   ? ? ?Social History  ? ?Socioeconomic History  ? Marital status: Single  ?  Spouse name: Not on file  ? Number of children: 2  ? Years of education: Not on file  ? Highest education level: Not on file  ?Occupational History  ? Occupation: retired  ?  Comment: Oncologist  ?Tobacco Use  ? Smoking status: Every Day  ?  Packs/day: 1.00  ?  Years: 25.00  ?  Pack  years: 25.00  ?  Types: Cigarettes  ? Smokeless tobacco: Never  ? Tobacco comments:  ?  stop june 2013  ?Substance and Sexual Activity  ? Alcohol use: No  ? Drug use: No  ? Sexual activity: Not on file  ?Other Topics Concern  ? Not on file  ?Social History Narrative  ? Not on file  ? ?Social Determinants of Health  ? ?Financial Resource Strain: Not on file  ?Food Insecurity: Not on file  ?Transportation Needs: Not on file  ?Physical Activity: Not on file  ?Stress: Not on file  ?Social Connections: Not on file  ?Intimate Partner Violence: Not on file  ? ? ?Review of systems: ?Review of Systems  ?Constitutional: Negative for fever and chills.  ?HENT: Negative.   ?Eyes: Negative for blurred vision.  ?Respiratory: as  per HPI  ?Cardiovascular: Negative for chest pain and palpitations.  ?Gastrointestinal: Negative for vomiting, diarrhea, blood per rectum. ?Genitourinary: Negative for dysuria, urgency, frequency and hematuria.  ?Musculoskeletal: Negative for myalgias, back pain and joint pain.  ?Skin: Negative for itching and rash.  ?Neurological: Negative for dizziness, tremors, focal weakness, seizures and loss of consciousness.  ?Endo/Heme/Allergies: Negative for environmental allergies.  ?Psychiatric/Behavioral: Negative for depression, suicidal ideas and hallucinations.  ?All other systems reviewed and are negative. ? ?Physical Exam: ?There were no vitals taken for this visit. ?Gen:      No acute distress ?HEENT:  EOMI, sclera anicteric ?Neck:     No masses; no thyromegaly ?Lungs:    Clear to auscultation bilaterally; normal respiratory effort ?CV:         Regular rate and rhythm; no murmurs ?Abd:      + bowel sounds; soft, non-tender; no palpable masses, no distension ?Ext:    No edema; adequate peripheral perfusion ?Skin:      Warm and dry; no rash ?Neuro: alert and oriented x 3 ?Psych: normal mood and affect ? ?Data Reviewed: ?Imaging: ?CT chest 09/13/2021 ?Moderate-sized left pleural effusion with somewhat loculated  appearance. Extensive consolidation of the left lower lobe with associated volume loss and more scattered groundglass opacities in the left upper lobe. Findings raise concern for left-sided pneumonia and atelectasi

## 2021-10-27 IMAGING — DX DG CHEST 1V PORT
1 series · 1 of 1 positions shown · non-contrast
Comparison: 02/24/2020, CT 02/24/2020

CLINICAL DATA: CVC placement

EXAM:
PORTABLE CHEST 1 VIEW

[chest ap]
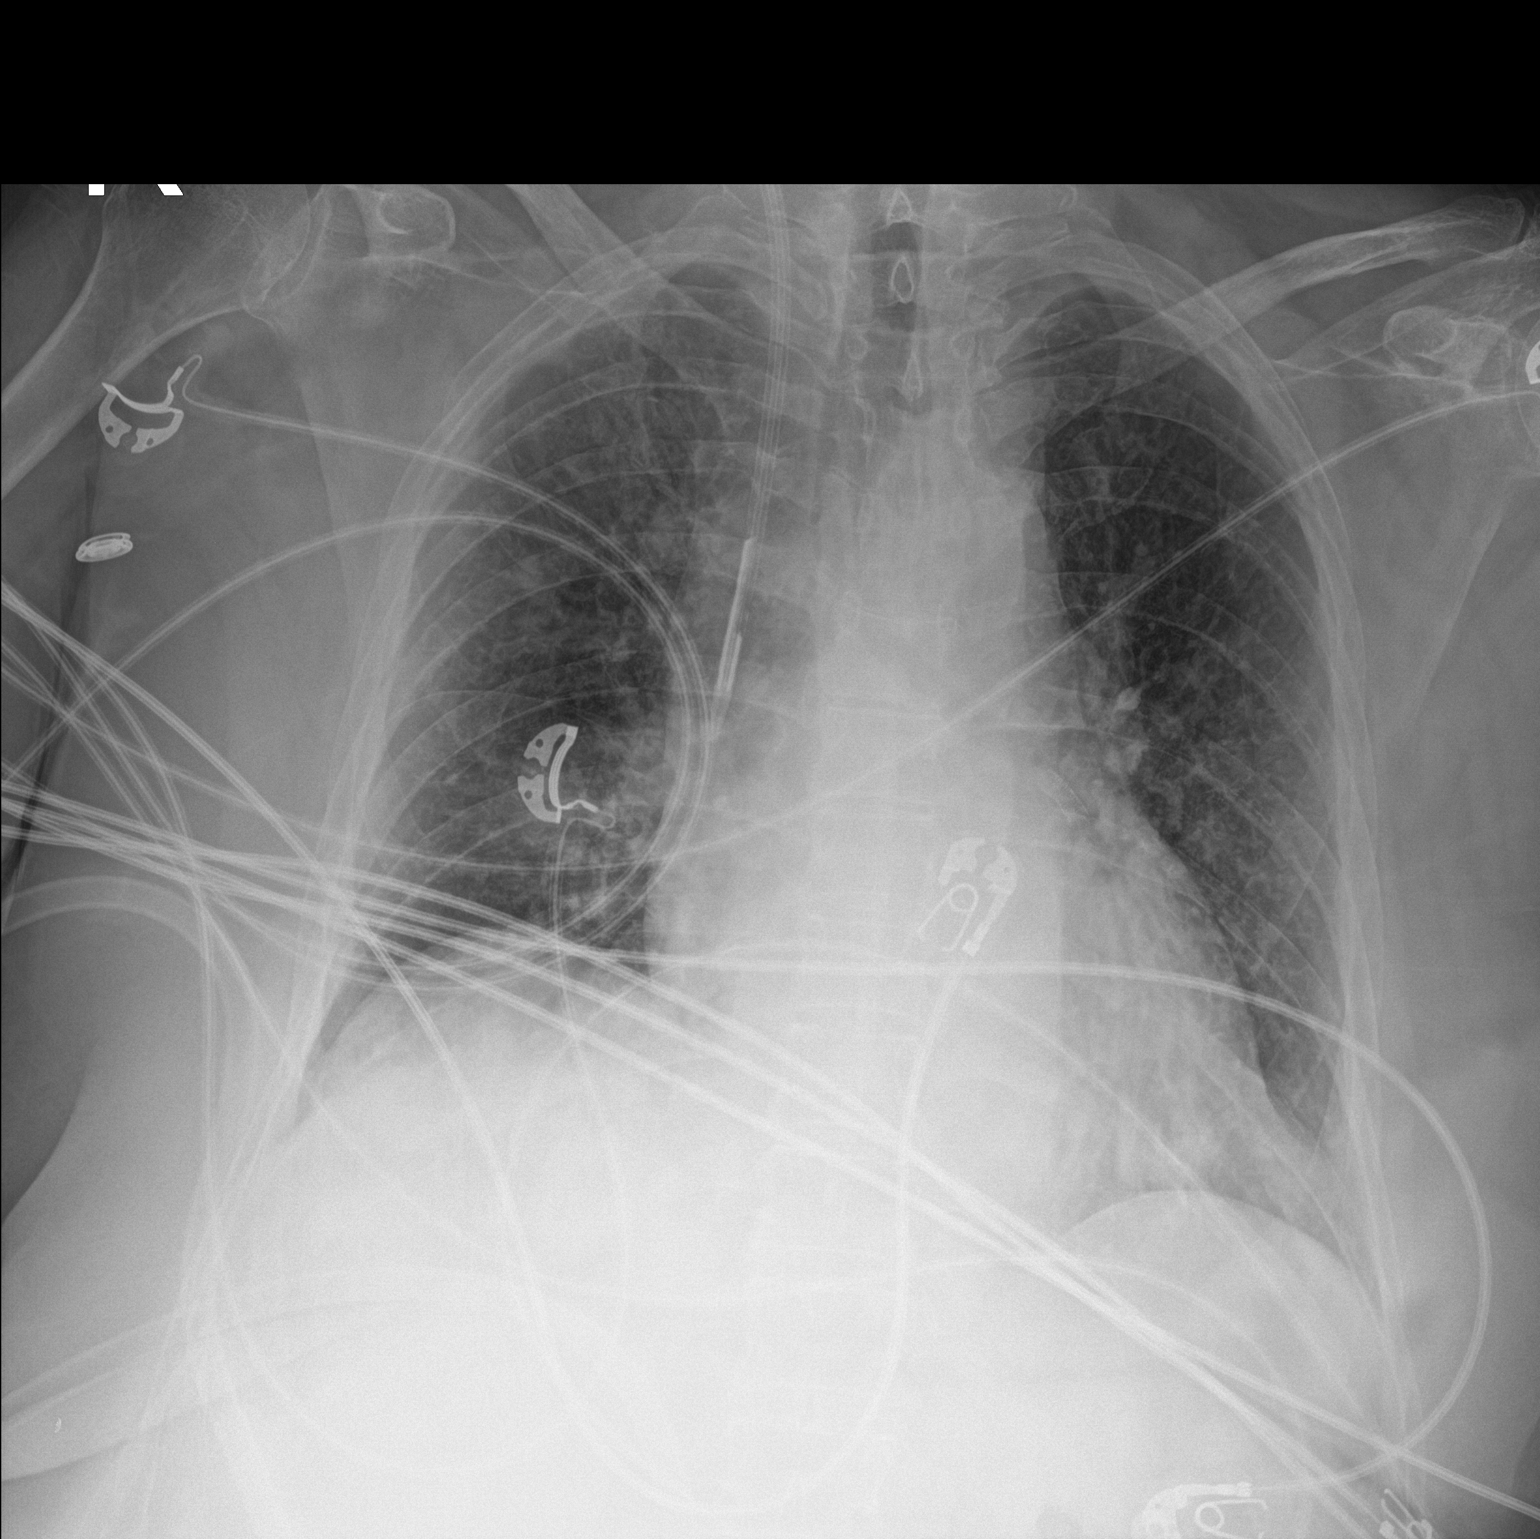

[1 of 1 positions shown; findings below may reference images not displayed]

FINDINGS: Right-sided central venous catheter with tip projecting over the
distal SVC. There is no pneumothorax identified. Vague bilateral
foci of airspace disease corresponding to CT areas of pneumonia.
Mild cardiomegaly.
IMPRESSION: Right-sided central venous catheter tip overlies the distal SVC. No
pneumothorax. Mild cardiomegaly. Scattered foci of airspace disease
corresponding to CT demonstrated pneumonia.

## 2021-10-27 IMAGING — CT CT CHEST W/O CM
2 series · 15 of 35 positions shown, 18 images · non-contrast
Comparison: 10/02/2018

CLINICAL DATA: Chest pain

EXAM:
CT CHEST WITHOUT CONTRAST
TECHNIQUE: Multidetector CT imaging of the chest was performed following the
standard protocol without IV contrast.

[Series 3: chest w/o 2mm st · axial · non-contrast · 0.98mm/px · z∈[-151,+115]mm · 12 of 157 slices shown, 15 images]
[im 12/157  mediastinal]
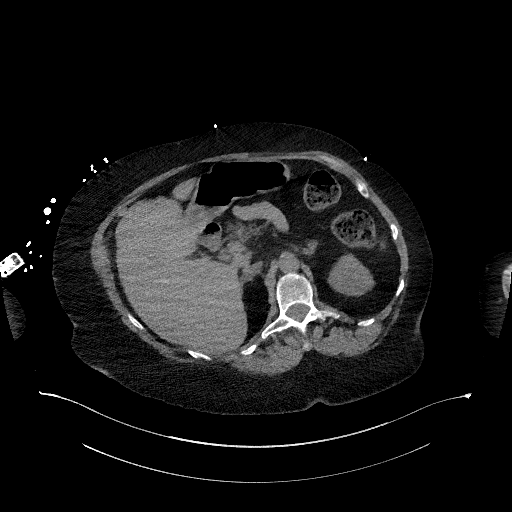
[im 12/157  lung]
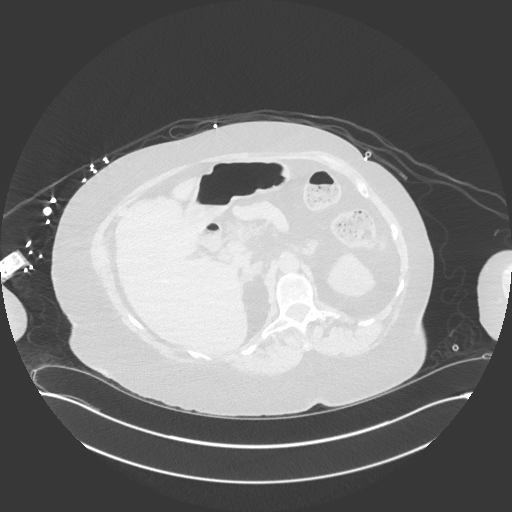
[im 24/157  lung]
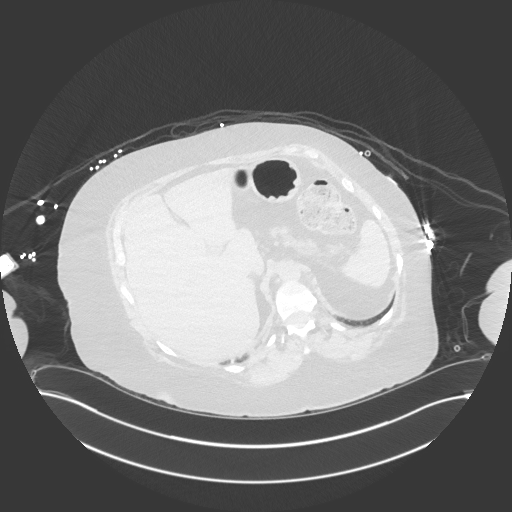
[im 35/157  lung]
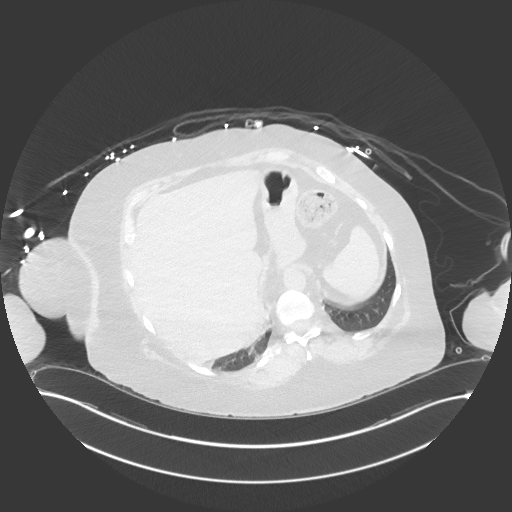
[im 47/157  lung]
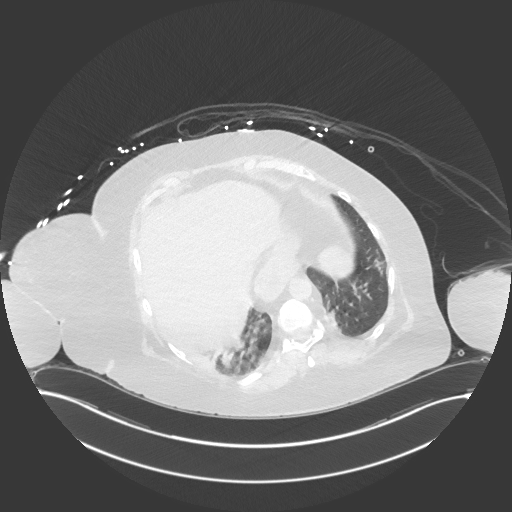
[im 64/157  mediastinal]
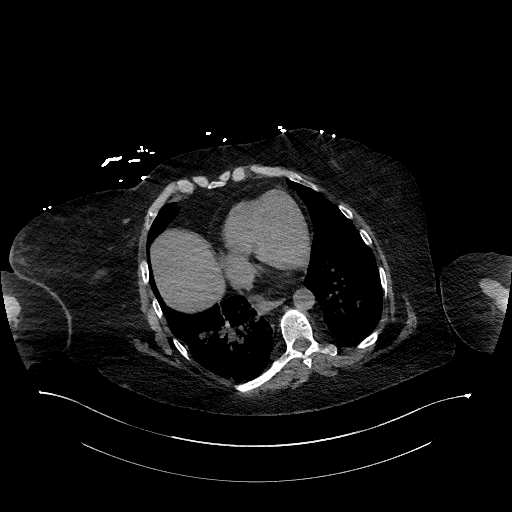
[im 64/157  lung]
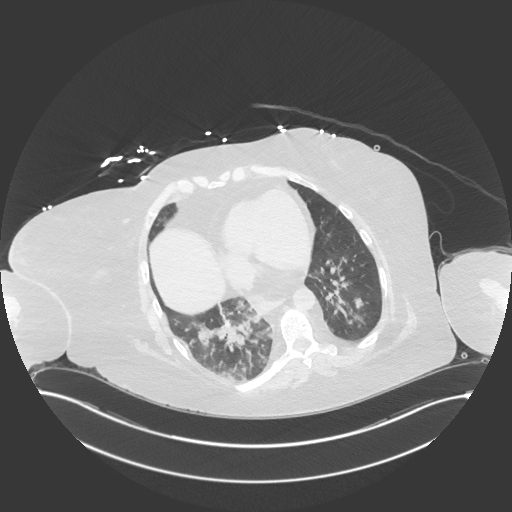
[im 74/157  lung]
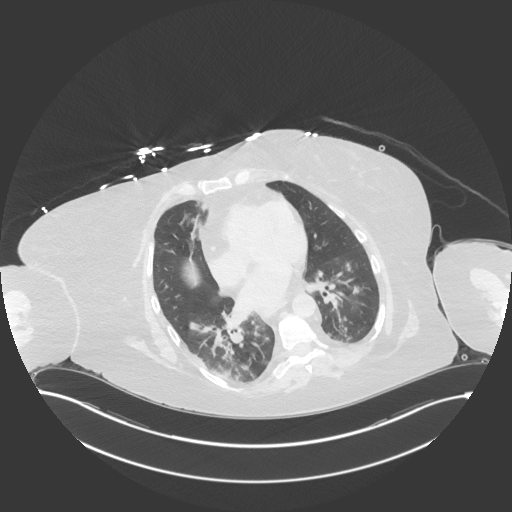
[im 81/157  lung]
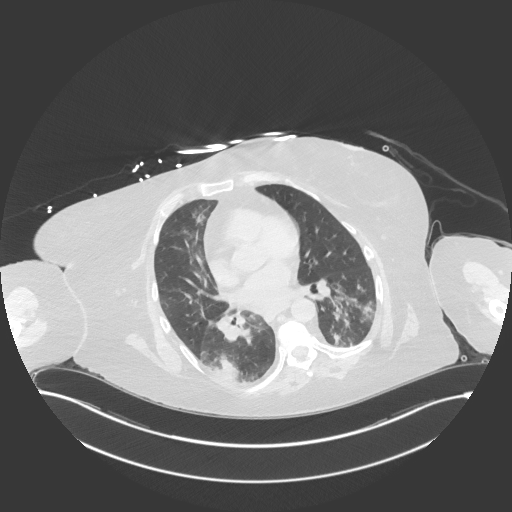
[im 93/157  lung]
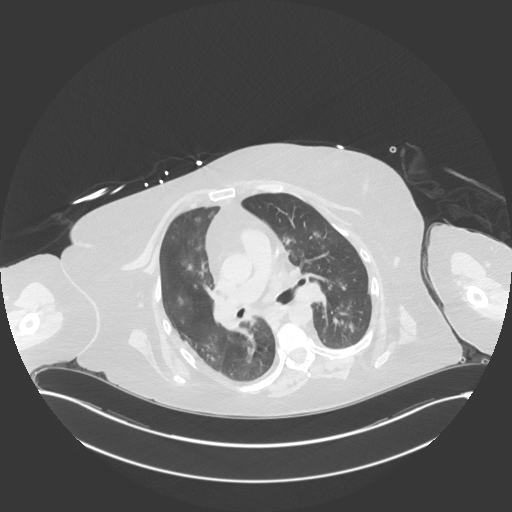
[im 110/157  mediastinal]
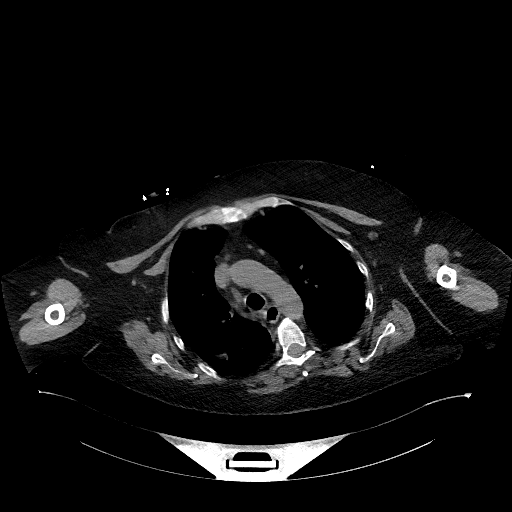
[im 110/157  lung]
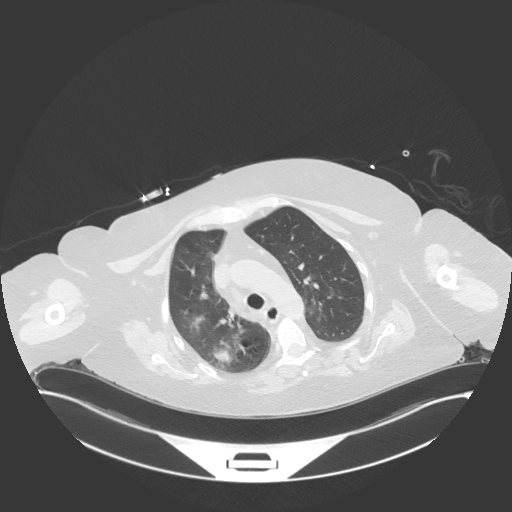
[im 122/157  lung]
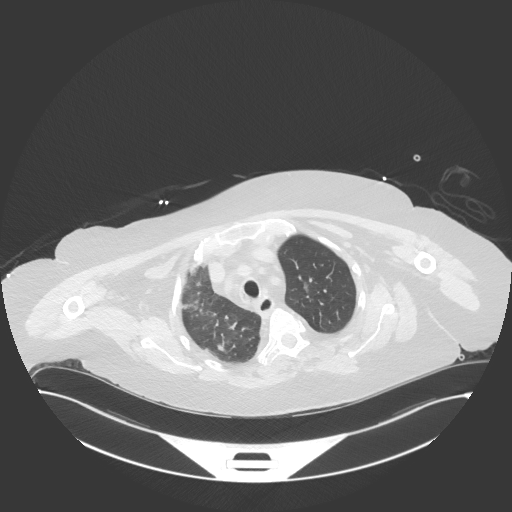
[im 133/157  lung]
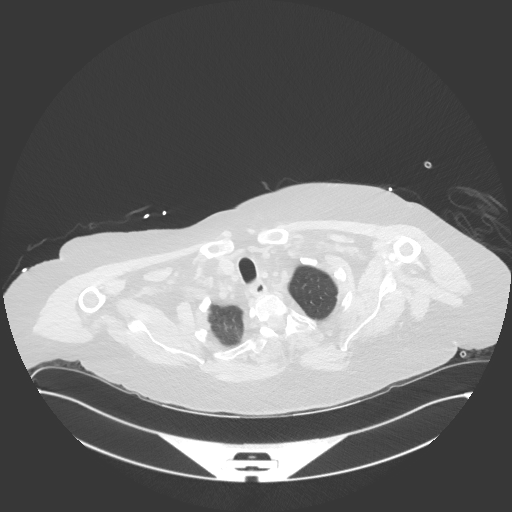
[im 145/157  lung]
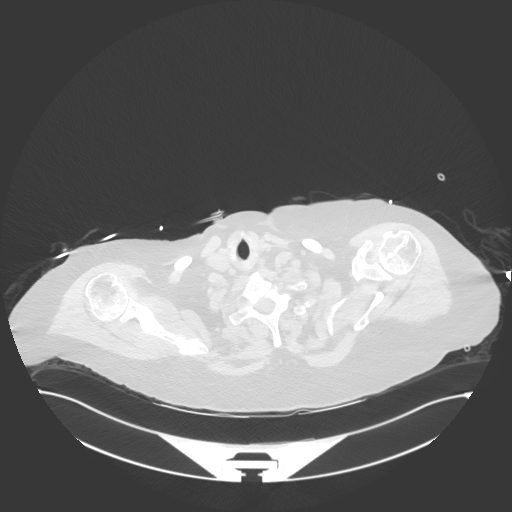

[Series 6: chest w/o 2mm st cor · coronal · non-contrast · 0.61mm/px · 3 of 166 slices shown]
[im 34/166  lung]
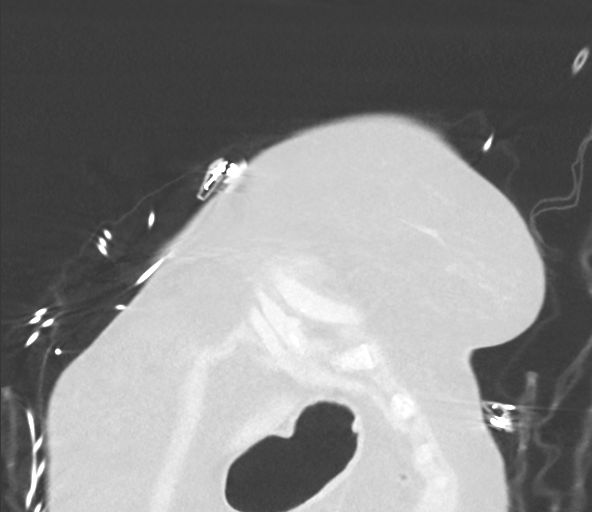
[im 67/166  lung]
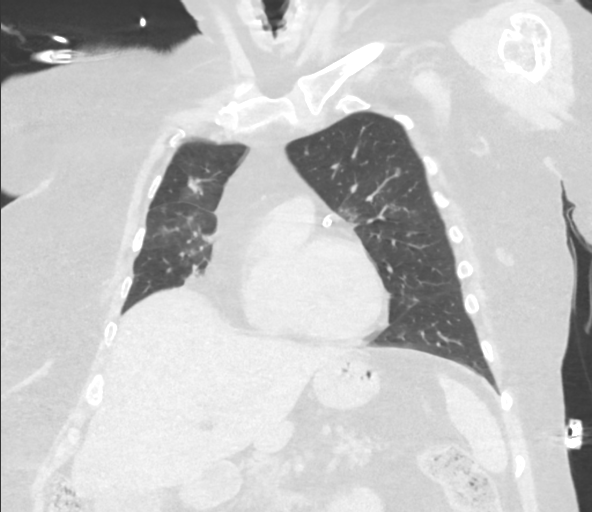
[im 100/166  lung]
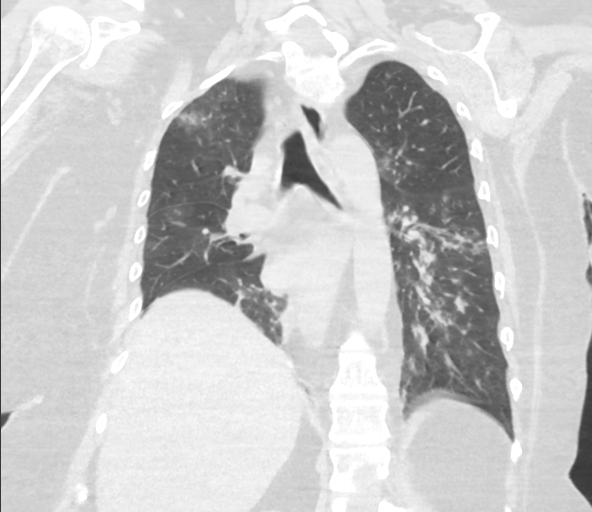

[15 of 35 positions shown; findings below may reference images not displayed]

FINDINGS: Cardiovascular: Left anterior descending coronary artery stenting
has been performed. Global cardiac size within normal limits.
Central pulmonary arteries are of normal caliber. Thoracic aorta is
unremarkable.

Mediastinum/Nodes: No pathologic thoracic adenopathy. Small hiatal
hernia. Esophagus unremarkable. Thyroid gland absent, possibly
postsurgical or post ablated in nature.

Lungs/Pleura: Multifocal peribronchial nodular infiltrates and
scattered areas of pulmonary consolidation are identified most in
keeping with atypical infection, more severe within the right lung.
No pneumothorax or pleural effusion. Central airways are widely
patent.

Upper Abdomen: Unremarkable

Musculoskeletal: Unremarkable
IMPRESSION: Multifocal peribronchial nodular infiltrates and scattered areas of
pulmonary consolidation are identified most in keeping with atypical
infection, more severe within the right lung. The findings are not
pathognomonic of [id number] pneumonia and additional organism should
be considered.

## 2021-12-03 ENCOUNTER — Ambulatory Visit: Payer: Medicare HMO | Admitting: Pulmonary Disease

## 2023-04-29 ENCOUNTER — Ambulatory Visit (HOSPITAL_COMMUNITY): Payer: Medicare HMO | Admitting: Licensed Clinical Social Worker

## 2023-05-14 ENCOUNTER — Ambulatory Visit (HOSPITAL_COMMUNITY): Payer: Medicare HMO | Admitting: Licensed Clinical Social Worker

## 2023-05-20 ENCOUNTER — Ambulatory Visit (HOSPITAL_COMMUNITY): Payer: Medicare HMO | Admitting: Psychiatry
